# Patient Record
Sex: Female | Born: 1970
Health system: Southern US, Community
[De-identification: ages and names within clinical notes are randomized; demographics above are authoritative.]

## PROBLEM LIST (undated history)

## (undated) DIAGNOSIS — Z8489 Family history of other specified conditions: Secondary | ICD-10-CM

## (undated) DIAGNOSIS — R011 Cardiac murmur, unspecified: Secondary | ICD-10-CM

## (undated) DIAGNOSIS — Z86018 Personal history of other benign neoplasm: Secondary | ICD-10-CM

## (undated) DIAGNOSIS — Z9889 Other specified postprocedural states: Secondary | ICD-10-CM

## (undated) DIAGNOSIS — R112 Nausea with vomiting, unspecified: Secondary | ICD-10-CM

## (undated) DIAGNOSIS — Q249 Congenital malformation of heart, unspecified: Secondary | ICD-10-CM

## (undated) DIAGNOSIS — Z9289 Personal history of other medical treatment: Secondary | ICD-10-CM

## (undated) DIAGNOSIS — E039 Hypothyroidism, unspecified: Secondary | ICD-10-CM

## (undated) DIAGNOSIS — T8859XA Other complications of anesthesia, initial encounter: Secondary | ICD-10-CM

## (undated) DIAGNOSIS — E042 Nontoxic multinodular goiter: Secondary | ICD-10-CM

## (undated) DIAGNOSIS — T4145XA Adverse effect of unspecified anesthetic, initial encounter: Secondary | ICD-10-CM

## (undated) HISTORY — DX: Cardiac murmur, unspecified: R01.1

## (undated) HISTORY — DX: Personal history of other benign neoplasm: Z86.018

## (undated) HISTORY — PX: OTHER SURGICAL HISTORY: SHX169

## (undated) HISTORY — DX: Personal history of other medical treatment: Z92.89

## (undated) HISTORY — PX: AORTIC VALVE REPLACEMENT: SHX41

## (undated) HISTORY — PX: MYOMECTOMY: SHX85

## (undated) HISTORY — DX: Other specified postprocedural states: Z98.890

## (undated) HISTORY — PX: CARDIAC VALVE REPLACEMENT: SHX585

## (undated) HISTORY — PX: CARDIAC CATHETERIZATION: SHX172

---

## 1998-10-10 ENCOUNTER — Other Ambulatory Visit: Admission: RE | Admit: 1998-10-10 | Discharge: 1998-10-10 | Payer: Self-pay | Admitting: *Deleted

## 1998-10-24 ENCOUNTER — Ambulatory Visit (HOSPITAL_COMMUNITY): Admission: RE | Admit: 1998-10-24 | Discharge: 1998-10-24 | Payer: Self-pay | Admitting: Family Medicine

## 2000-05-28 ENCOUNTER — Encounter: Payer: Self-pay | Admitting: Family Medicine

## 2000-05-28 ENCOUNTER — Encounter: Admission: RE | Admit: 2000-05-28 | Discharge: 2000-05-28 | Payer: Self-pay | Admitting: Family Medicine

## 2000-08-26 ENCOUNTER — Encounter: Admission: RE | Admit: 2000-08-26 | Discharge: 2000-08-26 | Payer: Self-pay | Admitting: Family Medicine

## 2000-08-26 ENCOUNTER — Encounter: Payer: Self-pay | Admitting: Family Medicine

## 2000-10-19 ENCOUNTER — Encounter: Payer: Self-pay | Admitting: Family Medicine

## 2000-10-19 ENCOUNTER — Encounter: Admission: RE | Admit: 2000-10-19 | Discharge: 2000-10-19 | Payer: Self-pay | Admitting: Family Medicine

## 2001-01-05 ENCOUNTER — Other Ambulatory Visit: Admission: RE | Admit: 2001-01-05 | Discharge: 2001-01-05 | Payer: Self-pay | Admitting: Obstetrics and Gynecology

## 2001-05-19 HISTORY — PX: THYROID LOBECTOMY: SHX420

## 2002-04-18 ENCOUNTER — Other Ambulatory Visit: Admission: RE | Admit: 2002-04-18 | Discharge: 2002-04-18 | Payer: Self-pay | Admitting: Obstetrics and Gynecology

## 2002-04-28 ENCOUNTER — Encounter (INDEPENDENT_AMBULATORY_CARE_PROVIDER_SITE_OTHER): Payer: Self-pay | Admitting: Specialist

## 2002-04-28 ENCOUNTER — Inpatient Hospital Stay (HOSPITAL_COMMUNITY): Admission: RE | Admit: 2002-04-28 | Discharge: 2002-04-30 | Payer: Self-pay | Admitting: Obstetrics and Gynecology

## 2003-05-05 ENCOUNTER — Other Ambulatory Visit: Admission: RE | Admit: 2003-05-05 | Discharge: 2003-05-05 | Payer: Self-pay | Admitting: Obstetrics and Gynecology

## 2004-06-21 ENCOUNTER — Other Ambulatory Visit: Admission: RE | Admit: 2004-06-21 | Discharge: 2004-06-21 | Payer: Self-pay | Admitting: Obstetrics and Gynecology

## 2006-05-01 ENCOUNTER — Ambulatory Visit: Payer: Self-pay | Admitting: Family Medicine

## 2006-08-31 ENCOUNTER — Ambulatory Visit (HOSPITAL_COMMUNITY): Admission: RE | Admit: 2006-08-31 | Discharge: 2006-08-31 | Payer: Self-pay | Admitting: Cardiology

## 2006-09-07 ENCOUNTER — Ambulatory Visit (HOSPITAL_COMMUNITY): Admission: RE | Admit: 2006-09-07 | Discharge: 2006-09-07 | Payer: Self-pay | Admitting: Cardiology

## 2006-09-24 ENCOUNTER — Ambulatory Visit: Payer: Self-pay | Admitting: Cardiothoracic Surgery

## 2006-10-15 ENCOUNTER — Ambulatory Visit: Payer: Self-pay | Admitting: Cardiothoracic Surgery

## 2006-10-23 ENCOUNTER — Ambulatory Visit (HOSPITAL_COMMUNITY): Admission: RE | Admit: 2006-10-23 | Discharge: 2006-10-23 | Payer: Self-pay | Admitting: Cardiology

## 2006-11-02 ENCOUNTER — Ambulatory Visit: Payer: Self-pay | Admitting: Cardiothoracic Surgery

## 2006-11-03 ENCOUNTER — Inpatient Hospital Stay (HOSPITAL_COMMUNITY): Admission: RE | Admit: 2006-11-03 | Discharge: 2006-11-08 | Payer: Self-pay | Admitting: Cardiothoracic Surgery

## 2006-11-03 ENCOUNTER — Ambulatory Visit: Payer: Self-pay | Admitting: Cardiothoracic Surgery

## 2006-11-03 ENCOUNTER — Encounter: Payer: Self-pay | Admitting: Cardiothoracic Surgery

## 2006-12-03 ENCOUNTER — Ambulatory Visit: Payer: Self-pay | Admitting: Cardiothoracic Surgery

## 2006-12-03 ENCOUNTER — Encounter (HOSPITAL_COMMUNITY): Admission: RE | Admit: 2006-12-03 | Discharge: 2007-03-03 | Payer: Self-pay | Admitting: Cardiology

## 2006-12-03 ENCOUNTER — Encounter: Admission: RE | Admit: 2006-12-03 | Discharge: 2006-12-03 | Payer: Self-pay | Admitting: Cardiothoracic Surgery

## 2007-03-04 ENCOUNTER — Encounter (HOSPITAL_COMMUNITY): Admission: RE | Admit: 2007-03-04 | Discharge: 2007-05-19 | Payer: Self-pay | Admitting: Cardiology

## 2007-08-01 ENCOUNTER — Emergency Department (HOSPITAL_COMMUNITY): Admission: EM | Admit: 2007-08-01 | Discharge: 2007-08-01 | Payer: Self-pay | Admitting: Emergency Medicine

## 2009-02-12 ENCOUNTER — Encounter: Admission: RE | Admit: 2009-02-12 | Discharge: 2009-02-12 | Payer: Self-pay | Admitting: Endocrinology

## 2009-02-27 ENCOUNTER — Other Ambulatory Visit: Admission: RE | Admit: 2009-02-27 | Discharge: 2009-02-27 | Payer: Self-pay | Admitting: Interventional Radiology

## 2009-02-27 ENCOUNTER — Encounter: Admission: RE | Admit: 2009-02-27 | Discharge: 2009-02-27 | Payer: Self-pay | Admitting: Endocrinology

## 2009-02-27 ENCOUNTER — Encounter (INDEPENDENT_AMBULATORY_CARE_PROVIDER_SITE_OTHER): Payer: Self-pay | Admitting: Interventional Radiology

## 2009-11-09 ENCOUNTER — Inpatient Hospital Stay (HOSPITAL_COMMUNITY): Admission: RE | Admit: 2009-11-09 | Discharge: 2009-11-11 | Payer: Self-pay | Admitting: Obstetrics and Gynecology

## 2009-11-16 ENCOUNTER — Encounter
Admission: RE | Admit: 2009-11-16 | Discharge: 2009-12-16 | Payer: Self-pay | Source: Home / Self Care | Admitting: Obstetrics and Gynecology

## 2009-12-17 ENCOUNTER — Encounter: Admission: RE | Admit: 2009-12-17 | Discharge: 2010-01-16 | Payer: Self-pay | Admitting: Obstetrics and Gynecology

## 2010-01-17 ENCOUNTER — Encounter: Admission: RE | Admit: 2010-01-17 | Discharge: 2010-02-05 | Payer: Self-pay | Admitting: Obstetrics and Gynecology

## 2010-02-17 ENCOUNTER — Encounter: Admission: RE | Admit: 2010-02-17 | Discharge: 2010-03-19 | Payer: Self-pay | Admitting: Obstetrics and Gynecology

## 2010-03-20 ENCOUNTER — Encounter
Admission: RE | Admit: 2010-03-20 | Discharge: 2010-04-19 | Payer: Self-pay | Source: Home / Self Care | Admitting: Obstetrics and Gynecology

## 2010-04-20 ENCOUNTER — Encounter
Admission: RE | Admit: 2010-04-20 | Discharge: 2010-05-20 | Payer: Self-pay | Source: Home / Self Care | Attending: Obstetrics and Gynecology | Admitting: Obstetrics and Gynecology

## 2010-05-21 ENCOUNTER — Encounter
Admission: RE | Admit: 2010-05-21 | Discharge: 2010-06-18 | Payer: Self-pay | Source: Home / Self Care | Attending: Obstetrics and Gynecology | Admitting: Obstetrics and Gynecology

## 2010-08-04 LAB — SURGICAL PCR SCREEN: MRSA, PCR: NEGATIVE

## 2010-08-04 LAB — CBC
HCT: 30.8 % — ABNORMAL LOW (ref 36.0–46.0)
HCT: 36.6 % (ref 36.0–46.0)
Hemoglobin: 10.9 g/dL — ABNORMAL LOW (ref 12.0–15.0)
MCH: 34.1 pg — ABNORMAL HIGH (ref 26.0–34.0)
MCV: 97 fL (ref 78.0–100.0)
MCV: 98.3 fL (ref 78.0–100.0)
Platelets: 128 10*3/uL — ABNORMAL LOW (ref 150–400)
RBC: 3.78 MIL/uL — ABNORMAL LOW (ref 3.87–5.11)
RDW: 12.9 % (ref 11.5–15.5)
WBC: 13 10*3/uL — ABNORMAL HIGH (ref 4.0–10.5)

## 2010-10-01 NOTE — Discharge Summary (Signed)
[name], [name] Short NO.:  [id number]   MEDICAL RECORD NO.:  [id number]          PATIENT TYPE:  INP   LOCATION:  2012                         FACILITY:  [facility]   PHYSICIAN:  Rowe Clack, P.A.-C. DATE OF BIRTH:  [birth year]   DATE OF ADMISSION:  11/03/2006  DATE OF DISCHARGE:  11/07/2006                               DISCHARGE SUMMARY   HISTORY OF PRESENT ILLNESS:  The patient is a 40 year old female who has  had a history of congenital heart disease.  She had been followed at  [facility] by Dr. Darliss Cheney, pediatric cardiologist, since  age 45, and was noted to have aortic regurgitation.  She was followed  between ages 24 and 74, and underwent cardiac catheterization at age 52.  In her 19s, she moved away, and when she returned she began to see Dr.  Elsie Lincoln, and has been having echocardiograms done annually to evaluate  her degree of aortic insufficiency.  Because of the evidence of dilated  aorta on her echocardiogram done in the fall of 2007, a CT scan was done  and this revealed ascending aortic dilatation to 5.2 cm.  She also had  moderate to severe aortic insufficiency with mild aortic stenosis on the  previous echocardiogram.  Estimated valve area was 1.9 cm2.  The left  ventricular end-diastolic dimension was 4.9 cm.  Her left ventricular  function was suggested to be normal without wall motion abnormality;  however, there was evidence of mild mitral regurgitation.  She was  referred to Dr. Tyrone Sage for surgical correction, and was admitted this  hospitalization for the procedure.   She denied any symptoms of congestive heart failure, and exercises on a  regular basis without limitation.  She denies hypertension, diabetes or  hyperlipidemia.  She is a nonsmoker.  She does have a family history of  cardiac disease, and paternal grandfather died in his 81s of cardiac  disease.  Both her mother and her maternal grandmother died at age 62,  one of  cancer of the gallbladder, and the other of liver cancer.  Her  father is alive with prostate cancer.  She has siblings with thyroid  problems, but no history of cardiac disease.  There is no history of  sudden death in her siblings.  She was admitted this hospitalization for  the surgical repair of her ascending aorta and aortic valve.   PAST MEDICAL HISTORY:  Positive for:  1. Fibroid tumor in the uterus, the size of a football, and removed      in 2003.  2. She also has a new diagnosis of a thyroid mass noted on CT scan.   MEDICATIONS PRIOR TO ADMISSION:  Birth control pills.   ALLERGIES:  No known drug allergies.   Family history, social history, review of systems and physical exam:  Please see the history and physical done at the time of admission.   HOSPITAL COURSE:  The patient was admitted electively on November 03, 2006,  taken to the operating room, at which time she underwent the following  procedure:  1. Aortic valve  replacement with a #25 Edwards pericardial      bioprosthetic.  2. Resection and grafting of ascending aortic dilatation with      replacement using a 30-mm Dacron Hemashield graft.   These procedures were performed by Dr. Tyrone Sage.  She tolerated the  procedure well, and was taken to the surgical intensive care unit in  stable condition.   POSTOPERATIVE HOSPITAL COURSE:  The patient has done well.  She was  weaned from the ventilator without difficulty.  She has remained  hemodynamically stable.  She has remained in normal sinus rhythm.  All  routine lines, monitors and drainage devices have been discontinued in  the standard fashion.  She has tolerated a routine advancement in  activity commensurate to level of postoperative convalescence using  standard cardiac rehabilitation phase 1 modalities.  Her incision is  healing well without evidence of infection.  Her valve sounds are crisp  without evidence of murmur.  She does have a moderate postoperative   anemia with a hematocrit of 28 on November 06, 2006.  She is being  supplemented with both iron and folic acid.  She had a postoperative  thrombocytopenia, but this is improving.  Most recent platelet count on  November 06, 2006, is 93,000.  Capillary blood glucoses have been under  adequate control.  She has tolerated a gentle diuresis, and is  responding well with no evidence of significant edema.  Her overall  status is felt to be stable for tentative discharge in the morning of  November 07, 2006, pending morning round reevaluation.   CONDITION ON DISCHARGE:  Stable and improved.   DISCHARGE MEDICATIONS:  Will be as follows:  1. Aspirin 325 mg daily.  2. Lopressor 25 mg twice daily.  3. Ferrous gluconate 324 mg daily.  4. Folic acid 1 mg daily.  5. She is to resume her birth control pill.  6. For pain, oxycodone 5 mg 1-2 tablets every 4-6 hours as needed.   DISCHARGE INSTRUCTIONS:  The patient received written instructions  regarding medications, activity, diet, wound care and followup.   FOLLOWUP:  Includes Dr. Elsie Lincoln in 2 weeks.  She is instructed to call  for this appointment.  Followup with Dr. Tyrone Sage will be on December 03, 2006, at 1:30 p.m. with a chest x-ray from [facility].   FINAL DIAGNOSES:  Severe aortic insufficiency and dilated ascending  aorta, now status post surgical repair.   OTHER DIAGNOSES:  Include:  1. Postoperative anemia.  2. Postoperative thrombocytopenia.  3. Remote history of fibroid tumors status post resection.  4. New diagnosis of thyroid mass on CT scan.      Rowe Clack, P.A.-C.     Sherryll Burger  D:  11/06/2006  T:  11/07/2006  Job:  [id number]   cc:   [name], M.D.  Sharlot Gowda, M.D.

## 2010-10-01 NOTE — Assessment & Plan Note (Signed)
OFFICE VISIT   [name] Short, [name] Short  DOB:  [birth year]                                        Oct 15, 2006  CHART #:  [id number]   The patient returns today for further discussion about the increasing  degree of her aortic insufficiency and dilated ascending aorta in the  range of 5.2 to 5.3 cm.  Since she was last seen, she has discussed the  case with Dr. Elsie Lincoln, and cardiac catheterization is arranged prior to  surgery.  In addition, she is also seeing Dr. Lucianne Muss in regard to the  multinodular goiter.  Thyroid function studies were obtained though I do  not have the results of these currently.   The patient's symptoms have not changed.  She denies any significant  shortness of breath or overt symptoms of congestive heart failure, and  exercises on a regular basis.   PHYSICAL EXAMINATION:  Her blood pressure is 128/91, pulse is 84,  respiratory rate is 18, O2 sat is 100%.  She has no pedal edema.  On  cardiac exam, murmur of aortic insufficiency, 2-3/6 is unchanged and  best heard along the right sternal border.   Dr. Elsie Lincoln plans outpatient cardiac catheterization on June the 6th, and  the patient would like to proceed with surgery on June the 17th.  We  discussed further the options of replacement, including root replacement  with tissue versus a mechanical valve prosthesis.  The patient, after  considering and investigation on her own, prefers to have a tissue  prosthesis rather than a mechanical valve.  The risks and options of  surgery have been explained to her and her husband again, including the  risk of death, infection, stroke, myocardial infarction, bleeding, blood  transfusion, and she is willing to proceed with acendind aortic and  aortic root replacement with  tissue valve and the ascending aortic replacement on June the 17th after  reviewing the cardiac catheterization films that are planned to be done  on June the 6th.   Sheliah Plane, MD  Electronically Signed   EG/MEDQ  D:  10/15/2006  T:  10/15/2006  Job:  [id number]   cc:   [name] Short, M.D.  Reather Littler, M.D.

## 2010-10-01 NOTE — Op Note (Signed)
[name], SHUFORD NO.:  [id number]   MEDICAL RECORD NO.:  [id number]          PATIENT TYPE:  INP   LOCATION:  2312                         FACILITY:  [facility]   PHYSICIAN:  Guadalupe Maple, M.D.  DATE OF BIRTH:  1970/12/08   DATE OF PROCEDURE:  11/02/2006  DATE OF DISCHARGE:                               OPERATIVE REPORT   PROCEDURE:  Intraoperative transesophageal echocardiography.   HISTORY:  [name] Short is a 40 year old woman female with a history  of an ascending aortic aneurysm and severe aortic insufficiency is  scheduled to undergo aortic valve replacement and repair of abdominal  aortic aneurysm by Dr. Sheliah Plane.  Intraoperative transesophageal  echocardiography was requested to evaluate the aortic valve and the  ascending aorta and to determine if any other valvular pathology were  present and to surf AV monitor for intraoperative volume status and left  ventricular function.   DESCRIPTION OF PROCEDURE:  The patient was brought into the operating  room at [facility].  General anesthesia is without difficulty.  The trachea was intubated easily.  The transesophageal echocardiography  probe was then inserted into the esophagus without difficulty.   IMPRESSION:  PREBYPASS FINDINGS:  1. Aortic valve.  The aortic valve was bicuspid and opened well.      There was no evidence of aortic stenosis.  Aortic valve area      measured 2.48 cm2 by the continuity, equation peak aortic gradient      was 26 mmHg mean, aortic gradient 12.8 mmHg.  There was severe      aortic insufficiency.  There was an area of malcoaptation of the      leaflets in the anterior portion of the coaptation line.  There was      a hint of aortic insufficiency which contacted the anterior leaflet      of the mitral valve.  The aortic insufficiency jet slope measured      3.7 meters per second with a pressure half time of 91.6      milliseconds.  The aortic annulus  measured 2.7 cm, the aortic root      of the sinuses of  Valsalva measured 3.8 cm, and the aortic root at      the sinotubular ridge measured 3.47 cm.  2. Ascending aorta.  There was dilation of the ascending aorta which      began approximately 1 to 2 cm distal to the sinotubular ridge.  The      maximum aortic diameter measured 5.1 cm at approximately 5 cm      distal to the aortic valve.  There was no evidence of dissection of      the ascending aorta.  3. Mitral valve.  The mitral leaflets appeared normal.  They coapted      well.  There was trace mitral insufficiency in multiple views.      There was no evidence of prolapse or fluttering in the leaflets.  4. Left ventricle.  The left ventricle was dilated and appeared      hyperdynamic.  Left ventricular wall thickness measured 0.9 cm, the      left ventricular diameter at end-diastole at the mid papillary      level measuring 5.40 cm.  Left ventricular end systolic diameter      measured 3.28 cm, left ventricular ejection fraction was estimated      at 60% to 65%.  There was good contractility in all left      ventricular segments interrogated and there was no      evidence of thrombus in the left ventricular apex.  5. Right ventricle.  The right ventricle   DICTATION ENDS HERE           ______________________________  Guadalupe Maple, M.D.     DCJ/MEDQ  D:  11/03/2006  T:  11/04/2006  Job:  [id number]   cc:   Guadalupe Maple, M.D.

## 2010-10-01 NOTE — Cardiovascular Report (Signed)
[name] Short, [name] Short NO.:  [id number]   MEDICAL RECORD NO.:  [id number]          PATIENT TYPE:  OIB   LOCATION:  2852                         FACILITY:  [facility]   PHYSICIAN:  [name] Short, M.D.DATE OF BIRTH:  03/29/1971   DATE OF PROCEDURE:  10/23/2006  DATE OF DISCHARGE:                            CARDIAC CATHETERIZATION   PROCEDURES PERFORMED:  1. Selective coronary angiography by Judkins technique.  2. Retrograde left heart catheterization.  3. Left ventricular angiography.  4. Aortic root angiogram.  5. Right heart catheterization with thermodilution and Fick cardiac      output determinations.   COMPLICATIONS:  None.   ENTRY SITE:  Right femoral artery and vein.   CATHETERS USED:  6-French arterial sheath and catheters which included a  6 FL6 left coronary catheter and a 6-French FL4 right coronary catheter.   INTERVENTIONS:  None.   PATIENT PROFILE:  [name] Short is a 40 year old white married female who is  a primary care patient of Dr. Ronnald Short, a cardiology patient of  mine, and formerly of Dr. Darliss Short, [facility]  who followed [name] as a child at around age 82.   [name] Short recently was seen by Dr. Susann Short and associates and referred to  [place] because of a heart murmur.  A 2-D echocardiogram showed moderately  severe aortic regurgitation and aortic root dilatation and, after I  examined her and took a history and got what limited records were  available from the [facility] pediatric program from years ago, I then saw the  patient in the office.  She is asymptomatic, having no shortness of  breath, chest pain or syncope.  We then decided to press ahead with  cardiac catheterization because of the patient's history of having had a  bicuspid valve and now having a markedly ectatic aorta with AI.   She has had a CT scan showing a 3 x 6 cm mass in the thoracic inlet  showing later that it was a multinodular goiter.  She has seen  [name] Short who feels that she is euthyroid and that this is a nontoxic  goiter.  She is not diabetic.  Today, [name] Short presents for an outpatient  cardiac catheterization to stratify her for upcoming aortic valve  surgery in view of her chronic AI, which is now severe, and marked  ectasia of the aorta.   RESULTS SHOW THE FOLLOWING:  Right atrial pressure is mean of 3, right  ventricular pressure 25/0, end-diastolic pressure 5.  Pulmonary artery  pressure 15/4, end-diastolic pressure 9.  This 10 mm gradient across the  pulmonary artery to right ventricle is within established normal limits.  Pulmonary capillary wedge mean pressure 10.  Fick cardiac output 5.9.  Fick cardiac index 3.1.  Thermodilution cardiac output 7.4.  Thermodilution cardiac index 3.9.  Left ventricular pressure 123/0, end-  diastolic pressure 13, central aortic pressure 117/75, mean of 92.  There was no significant aortic valve gradient by pullback technique.   ANGIOGRAPHIC RESULTS:  The coronary arteries are normal.  It is a system  where  both circumflex and LAD are codominant.  The right coronary artery  is small.  The arteries all look pristine with no lesions anywhere.   Aortic root angiography shows that there is a very large ectatic aorta  including the innominate and subclavian arteries.  The left ventricle  opacifies with dye with the first systolic beat indicating severe AI.  The LV is upper limits of normal to mildly slightly dilated in size.  Ejection fraction is estimated to be 50-60% and I lean more toward the  60%.  There is no mitral regurgitation seen.   FINAL DIAGNOSES:  1. Congenital heart disease with a bicuspid aortic valve, followed      since age 62.  2. Asymptomatic aortic regurgitation, severe by angiography.  3. Angiographically patent coronary arteries with no evidence of any      significant stenoses.  4. Low-normal to normal LV systolic function.  5. No mitral regurgitation.  6.  Normal cardiac outputs.   PLAN:  The patient's results will be discussed with Dr. Tyrone Short and he  will plan a course of action regarding aortic valve replacement and  possibly consideration for an aortic root replacement.  I will defer  that decision to him.  The patient will be discharged today after her 6-  hour recovery and be followed as an outpatient.           ______________________________  [name] Short, M.D.     WHG/MEDQ  D:  10/23/2006  T:  10/23/2006  Job:  [id number]   cc:   [name] Plane, MD  [name] Short, M.D.  Reather Short, M.D.

## 2010-10-01 NOTE — Assessment & Plan Note (Signed)
OFFICE VISIT   [name] Short, [name] Short  DOB:  05/28/1970                                        December 03, 2006  CHART #:  [id number]   HISTORY OF PRESENT ILLNESS:  The patient is a 40 year old female who is  status post replacement of the ascending aorta with a 30-mm Hemashield  Dacron graft supra coronary, and replacement of the aortic valve with  pericardial tissue valve, [employer], model 2700 25 mm, serial  [id number].  The procedure was performed 11/03/2006 by Dr. Tyrone Sage.  She  is here for her post discharge followup appointment.  She voices no  current complaints.  She did have some initial difficulty with insomnia  when she first got home, but this has resolved.  She has also had some  mild musculoskeletal issues which have improved with a type of massage  therapy.  She is not on Coumadin at this time for her tissue valve.   PHYSICAL EXAMINATION:  Vital signs:  Blood pressure 118/81, pulse 86,  respirations 18, oxygen saturation is 100%.  Pulmonary examination:  Lung sounds are clear.  Cardiac examination:  Regular rate and rhythm,  normal S1, S2 without murmurs, gallops, or rubs.  Extremity examination  reveals no edema.  Incisions are all healing well without evidence of  infection.  Chest x-ray was reviewed and shows clear lung fields and no  evidence of acute disease.   ASSESSMENT:  The patient is doing quite well following her aortic arch  and aortic valve replacement as described above for her preoperative  diagnosis of severe aortic insufficiency with dilated ascending aorta.  She was seen by Dr. Tyrone Sage who informed her that she can go back to  driving at this time.  Additionally, he stated that she can begin  working in August.  She will be followed up on a routine basis by her  cardiologist, Dr. Elsie Lincoln.  Follow up with the [facility] office will be on a  p.r.n. basis at this time.   Rowe Clack, P.A.-C.   Sherryll Burger  D:  12/03/2006  T:   12/04/2006  Job:  [id number]

## 2010-10-01 NOTE — Op Note (Signed)
NAMEKATELINE, KINKADE NO.:  [id number]   MEDICAL RECORD NO.:  [id number]          PATIENT TYPE:  OIB   LOCATION:  2852                         FACILITY:  [facility]   PHYSICIAN:  Sheliah Plane, MD    DATE OF BIRTH:  [birth year]   DATE OF PROCEDURE:  11/03/2006  DATE OF DISCHARGE:  10/23/2006                               OPERATIVE REPORT   PREOPERATIVE DIAGNOSIS:  Severe aortic insufficiency and dilated  ascending aorta.   POSTOPERATIVE DIAGNOSIS:  Severe aortic insufficiency and dilated  ascending aorta.   SURGICAL PROCEDURES:  Replacement of the ascending aorta with a 30 mm  Hemashield Dacron graft supra coronary and replacement of the aortic  valve with a pericardial tissue valve, [employer], model 2700  25 mm, serial number [id number].   SURGEON:  Sheliah Plane, MD   FIRST ASSISTANT:  Gershon Crane, PA   BRIEF HISTORY:  The patient is a 40 year old female who had longstanding  history of aortic valvular disease and dilated as a small child and a  more recently had been followed and noted to have a expanding ascending  aorta with severe aortic insufficiency.  Surgical repair was recommended  to the patient,  Discussion of valve types including [employer] freestyle  homografts, tissue valves.  Mechanical valves were all discussed in  detail with the patient.  She desired not to take Coumadin.  Risks and  options were discussed with the patient in detail and she was willing to  proceed.   DESCRIPTION OF PROCEDURE:  With Swan-Ganz and arterial line monitoring  placed.  The patient underwent general endotracheal anesthesia without  incident.  Skin of the chest and legs was prepped with Betadine and  draped in the usual sterile manner.  A transesophageal echo probe was  placed and a findings are dictated under separate note but they  confirmed a dilated ascending aorta, fairly normal aortic root to just  under 3 cm at the sinotubular ridge with bicuspid  aortic valve and mild  ventricular dilatation.  The patient skin chest and legs was prepped  with Betadine and draped in usual sterile manner.  Median sternotomy was  performed.  Pericardium was opened, aorta was examined.  The patient was  systemically heparinized.  The aortic cannulation was performed in  distal ascending aorta where the aorta is not dilated.  A right atrial  cannulation site was created.  A retrograde cardioplegia catheter was  placed in the coronary sinus. The patient was placed on cardiopulmonary  bypass 2.4 liters per minute per meter squared.  The right superior  pulmonary vein vent was placed.  The patient's body temperature was  cooled to 30 degrees.  Aortic crossclamp was applied.  Cold blood  potassium cardioplegia was initially administered retrograde.  With the  heart arrested, the ascending aorta was opened and the dilated portions  excised.  With the aortic root not particularly dilated and the  sinotubular ridge of fairly normal caliber. Supracoronary placement of  graft was selected and the aortic valve was excised and sized for 25  pericardial tissue valve.  Using #2 Tycron pledgeted sutures were placed  circumferentially around the aortic annulus and the valve was secured in  place. 30 mm Hemashield woven graft was selected to replace the  ascending aorta.  The proximal suture line was done over felt strips  with a running 3-0 Prolene suture.  The distal anastomosis was also done  in a circumferential manner over felt strip with a running 3-0 Prolene.  Prior to complete closure, the heart was allowed to passively fill  deair.  Warm retrograde cardioplegia was administered.  Aortic  crossclamp was removed with total crossclamp time of 89 minutes. A  Magoon needle was placed in the ascending aorta further de-airing the  heart.  The patient spontaneously converted to a sinus rhythm with body  temperature rewarmed to 37 degrees.  She was then ventilated  weaned from  cardiopulmonary bypass without difficulty.  TEE showed good function of  mitral and aortic valve.  The patient was decannulated in usual fashion.  Protamine sulfate was administered.  With operative field hemostatic,  two atrial and two ventricular pacing wires applied.  The pericardium  was reapproximated.  Sternum closed #6 stainless steel wire over two  Silastic mediastinal drains.  Sternum was closed #6 stainless steel  wire.  Fascia closed with interrupted 0-0 Vicryl running 3-0 Vicryl  subcutaneous tissue, 4-0 subcuticular stitch in skin edges.  Dry  dressings were applied.  Sponge and needle count was reported correct at  completion of procedure.  The patient tolerated the procedure without  obvious complication was transferred to surgical intensive care unit for  further postoperative care.      Sheliah Plane, MD  Electronically Signed     EG/MEDQ  D:  11/19/2006  T:  11/20/2006  Job:  [id number]   cc:   [name], M.D.

## 2010-10-04 NOTE — Discharge Summary (Signed)
   NAME:  [name] Short, [name] Short                        ACCOUNT NO.:  [id number]   MEDICAL RECORD NO.:  [id number]                   PATIENT TYPE:  INP   LOCATION:  0462                                 FACILITY:  [facility]   PHYSICIAN:  Michelle L. Vincente Poli, M.D.            DATE OF BIRTH:  Oct 28, 1970   DATE OF ADMISSION:  04/28/2002  DATE OF DISCHARGE:  04/30/2002                                 DISCHARGE SUMMARY   ADMISSION DIAGNOSES:  Symptomatic fibroids.   DISCHARGE DIAGNOSES:  Symptomatic fibroids.   HOSPITAL COURSE:  The patient is a 40 year old gravida 0 with symptomatic  uterine fibroids.  Of note, she has a history of mild aortic stenosis.  On  the day of admission she underwent abdominal myomectomies.  The surgery went  very well.  Postoperative course was very uncomplicated.  Her postoperative  hemoglobin on postoperative day number one was 10.1.  Her white blood cell  count was 5.1.  She remained afebrile with stable vital signs.  She was  discharged home on postoperative day number two after tolerating regular  diet, ambulating, voiding, and having flatus.  She was discharged home with  Tylox number 30 and ibuprofen 600 mg number 30 to take as needed for pain.  She will follow up in four to six weeks for a postoperative check.  She was  discharged home and advised no driving for one week, no heavy lifting, and  to call if she has nausea, vomiting, severe abdominal pain, temperature  greater than 100.5, or redness or drainage from incision site.                                               Michelle L. Vincente Poli, M.D.    Florestine Avers  D:  06/14/2002  T:  06/14/2002  Job:  [id number]

## 2010-10-04 NOTE — Op Note (Signed)
NAME:  [name] Short, [name] Short                        ACCOUNT NO.:  [id number]   MEDICAL RECORD NO.:  [id number]                   PATIENT TYPE:  INP   LOCATION:  0462                                 FACILITY:  [facility]   PHYSICIAN:  Michelle L. Vincente Poli, M.D.            DATE OF BIRTH:  [birth year]   DATE OF PROCEDURE:  DATE OF DISCHARGE:  04/30/2002                                 OPERATIVE REPORT   PREOPERATIVE DIAGNOSIS:  Symptomatic fibroids.   POSTOPERATIVE DIAGNOSIS:  Symptomatic fibroids.   PROCEDURE:  Abdominal myomectomies.   SURGEON:  Michelle L. Vincente Poli, M.D.   ASSISTANT:  Juluis Mire, M.D.   ANESTHESIA:  General.   ESTIMATED BLOOD LOSS:  1300 mL.   FINDINGS:  Myomatous uterus.   DESCRIPTION OF PROCEDURE:  The patient was taken to the operating room.  She  was intubated without difficulty.  She was then prepped and draped in the  usual sterile fashion.  A Foley catheter was inserted into the bladder.  Using a scalpel, a low transverse incision was made and a Pfannenstiel  incision was developed with good hemostasis.  The fascia was scored in the  midline and extended laterally, and the peritoneum was entered bluntly.  The  peritoneal incision was then stretched.  Exploration of the abdomen and  pelvis revealed a markedly enlarged uterus; however, there appeared to be no  adhesions and there was no evidence of endometriosis and her adnexa appeared  to be within normal limits.  We then examined the uterus thoroughly.  She  had a very large, approximately diameter of 14-15 cm, fibroid which was  arising from the fundus.  The uterus was exteriorized and then we basically  used a scalpel and removed the fibroid at the base.  There was vigorous  blood supply to the area and that was noted; however, there was a smaller  fibroid which was just beneath this large one.  We then cored it out in the  fundal region and then for hemostasis we used several running stitches in  layers  using 2-0 Vicryl suture, and the serosa was closed with 2-0 Vicryl.  Because of the nature of the incision, the patient would need to have a  cesarean delivery if she conceived in the future, and the patient and her  mother were counseled after surgery in regard to this.  After hemostasis was  assured, Interceed was placed at the incision site and irrigation was  performed.  No bleeding was noted, and the abdomen and peritoneum were  closed.  The peritoneum was closed using 0 Vicryl in a continuous running  stitch, and the rectus muscles were reapproximated using the same 0 Vicryl.  The fascia was closed using 0 Vicryl in a continuous running stitch,  starting at each corner and meeting in the midline. After irrigation of the  subcutaneous layer, the skin was closed with staples.  All sponge, lap, and  instrument counts were correct x2.  The patient tolerated the procedure well  and went to the recovery room in stable condition.                                               Michelle L. Vincente Poli, M.D.   Florestine Avers  D:  06/14/2002  T:  06/14/2002  Job:  [id number]

## 2011-03-05 LAB — POCT I-STAT 4, (NA,K, GLUC, HGB,HCT)
Glucose, Bld: 102 — ABNORMAL HIGH
Glucose, Bld: 108 — ABNORMAL HIGH
Glucose, Bld: 123 — ABNORMAL HIGH
Glucose, Bld: 139 — ABNORMAL HIGH
Glucose, Bld: 71
Glucose, Bld: 99
HCT: 24 — ABNORMAL LOW
HCT: 26 — ABNORMAL LOW
HCT: 27 — ABNORMAL LOW
HCT: 32 — ABNORMAL LOW
HCT: 32 — ABNORMAL LOW
HCT: 33 — ABNORMAL LOW
Hemoglobin: 10.9 — ABNORMAL LOW
Hemoglobin: 10.9 — ABNORMAL LOW
Hemoglobin: 11.2 — ABNORMAL LOW
Hemoglobin: 8.2 — ABNORMAL LOW
Hemoglobin: 8.8 — ABNORMAL LOW
Hemoglobin: 9.2 — ABNORMAL LOW
Operator id: 252761
Operator id: 3342
Operator id: 3342
Operator id: 3342
Operator id: 3342
Operator id: 3342
Potassium: 3.1 — ABNORMAL LOW
Potassium: 3.3 — ABNORMAL LOW
Potassium: 3.4 — ABNORMAL LOW
Potassium: 3.5
Potassium: 3.5
Potassium: 4.3
Sodium: 132 — ABNORMAL LOW
Sodium: 132 — ABNORMAL LOW
Sodium: 134 — ABNORMAL LOW
Sodium: 135
Sodium: 135
Sodium: 138

## 2011-03-05 LAB — CBC
HCT: 25.8 — ABNORMAL LOW
HCT: 25.9 — ABNORMAL LOW
HCT: 27 — ABNORMAL LOW
HCT: 27.6 — ABNORMAL LOW
HCT: 27.6 — ABNORMAL LOW
HCT: 28 — ABNORMAL LOW
Hemoglobin: 8.8 — ABNORMAL LOW
Hemoglobin: 8.9 — ABNORMAL LOW
Hemoglobin: 9.2 — ABNORMAL LOW
Hemoglobin: 9.3 — ABNORMAL LOW
Hemoglobin: 9.5 — ABNORMAL LOW
Hemoglobin: 9.6 — ABNORMAL LOW
MCHC: 33.8
MCHC: 34
MCHC: 34.1
MCHC: 34.2
MCHC: 34.4
MCHC: 34.4
MCV: 92.4
MCV: 92.6
MCV: 93
MCV: 93.2
MCV: 93.3
MCV: 93.8
Platelets: 77 — ABNORMAL LOW
Platelets: 78 — ABNORMAL LOW
Platelets: 85 — ABNORMAL LOW
Platelets: 87 — ABNORMAL LOW
Platelets: 89 — ABNORMAL LOW
Platelets: 93 — ABNORMAL LOW
RBC: 2.78 — ABNORMAL LOW
RBC: 2.79 — ABNORMAL LOW
RBC: 2.9 — ABNORMAL LOW
RBC: 2.96 — ABNORMAL LOW
RBC: 2.98 — ABNORMAL LOW
RBC: 2.98 — ABNORMAL LOW
RDW: 12.1
RDW: 12.3
RDW: 12.5
RDW: 12.6
RDW: 12.7
RDW: 12.7
WBC: 10.4
WBC: 10.7 — ABNORMAL HIGH
WBC: 11.5 — ABNORMAL HIGH
WBC: 11.9 — ABNORMAL HIGH
WBC: 8.6
WBC: 8.7

## 2011-03-05 LAB — POCT I-STAT 3, ART BLOOD GAS (G3+)
Acid-base deficit: 2
Acid-base deficit: 3 — ABNORMAL HIGH
Acid-base deficit: 4 — ABNORMAL HIGH
Acid-base deficit: 5 — ABNORMAL HIGH
Bicarbonate: 19.4 — ABNORMAL LOW
Bicarbonate: 20.6
Bicarbonate: 21.2
Bicarbonate: 22.1
O2 Saturation: 100
O2 Saturation: 100
O2 Saturation: 100
O2 Saturation: 99
Operator id: 183261
Operator id: 252761
Operator id: 3342
Operator id: 3342
Patient temperature: 34.3
Patient temperature: 37.8
TCO2: 20
TCO2: 22
TCO2: 22
TCO2: 23
pCO2 arterial: 30.7 — ABNORMAL LOW
pCO2 arterial: 31.4 — ABNORMAL LOW
pCO2 arterial: 32.3 — ABNORMAL LOW
pCO2 arterial: 34.8 — ABNORMAL LOW
pH, Arterial: 7.402 — ABNORMAL HIGH
pH, Arterial: 7.411 — ABNORMAL HIGH
pH, Arterial: 7.422 — ABNORMAL HIGH
pH, Arterial: 7.424 — ABNORMAL HIGH
pO2, Arterial: 129 — ABNORMAL HIGH
pO2, Arterial: 186 — ABNORMAL HIGH
pO2, Arterial: 363 — ABNORMAL HIGH
pO2, Arterial: 495 — ABNORMAL HIGH

## 2011-03-05 LAB — I-STAT 8, (EC8 V) (CONVERTED LAB)
Acid-base deficit: 1
BUN: 6
Bicarbonate: 23.7
Chloride: 102
Glucose, Bld: 122 — ABNORMAL HIGH
HCT: 28 — ABNORMAL LOW
Hemoglobin: 9.5 — ABNORMAL LOW
Operator id: 203371
Potassium: 3.7
Sodium: 137
TCO2: 25
pCO2, Ven: 38.5 — ABNORMAL LOW
pH, Ven: 7.397 — ABNORMAL HIGH

## 2011-03-05 LAB — I-STAT EC8
Acid-base deficit: 5 — ABNORMAL HIGH
BUN: 6
Bicarbonate: 18.6 — ABNORMAL LOW
Chloride: 108
Glucose, Bld: 141 — ABNORMAL HIGH
HCT: 26 — ABNORMAL LOW
Hemoglobin: 8.8 — ABNORMAL LOW
Operator id: 183261
Potassium: 3.5
Sodium: 139
TCO2: 20
pCO2 arterial: 29.4 — ABNORMAL LOW
pH, Arterial: 7.41 — ABNORMAL HIGH

## 2011-03-05 LAB — BASIC METABOLIC PANEL
BUN: 4 — ABNORMAL LOW
BUN: 4 — ABNORMAL LOW
BUN: 5 — ABNORMAL LOW
BUN: 6
CO2: 21
CO2: 26
CO2: 29
CO2: 29
Calcium: 7.4 — ABNORMAL LOW
Calcium: 7.4 — ABNORMAL LOW
Calcium: 8 — ABNORMAL LOW
Calcium: 8.3 — ABNORMAL LOW
Chloride: 104
Chloride: 105
Chloride: 106
Chloride: 108
Creatinine, Ser: 0.53
Creatinine, Ser: 0.54
Creatinine, Ser: 0.64
Creatinine, Ser: 0.69
GFR calc Af Amer: >60
GFR calc Af Amer: >60
GFR calc Af Amer: >60
GFR calc Af Amer: >60
GFR calc non Af Amer: >60
GFR calc non Af Amer: >60
GFR calc non Af Amer: >60
GFR calc non Af Amer: >60
Glucose, Bld: 117 — ABNORMAL HIGH
Glucose, Bld: 117 — ABNORMAL HIGH
Glucose, Bld: 123 — ABNORMAL HIGH
Glucose, Bld: 124 — ABNORMAL HIGH
Potassium: 3.7
Potassium: 3.9
Potassium: 3.9
Potassium: 3.9
Sodium: 134 — ABNORMAL LOW
Sodium: 135
Sodium: 137
Sodium: 138

## 2011-03-05 LAB — POCT I-STAT 3, VENOUS BLOOD GAS (G3P V)
Acid-base deficit: 3 — ABNORMAL HIGH
Bicarbonate: 21.9
O2 Saturation: 86
Operator id: 3342
TCO2: 23
pCO2, Ven: 37.2 — ABNORMAL LOW
pH, Ven: 7.377 — ABNORMAL HIGH
pO2, Ven: 52 — ABNORMAL HIGH

## 2011-03-05 LAB — PROTIME-INR
INR: 1.6 — ABNORMAL HIGH
Prothrombin Time: 20 — ABNORMAL HIGH

## 2011-03-05 LAB — CREATININE, SERUM
Creatinine, Ser: 0.67
Creatinine, Ser: 0.71
GFR calc Af Amer: >60
GFR calc Af Amer: >60
GFR calc non Af Amer: >60
GFR calc non Af Amer: >60

## 2011-03-05 LAB — MAGNESIUM
Magnesium: 2
Magnesium: 2
Magnesium: 2.1

## 2011-03-05 LAB — PLATELET COUNT: Platelets: 158

## 2011-03-05 LAB — HEMOGLOBIN AND HEMATOCRIT, BLOOD
HCT: 28.4 — ABNORMAL LOW
Hemoglobin: 9.7 — ABNORMAL LOW

## 2011-03-05 LAB — CK TOTAL AND CKMB (NOT AT ARMC)
CK, MB: 10.6 — ABNORMAL HIGH
Relative Index: 5.5 — ABNORMAL HIGH
Total CK: 194 — ABNORMAL HIGH

## 2011-03-05 LAB — APTT: aPTT: 45 — ABNORMAL HIGH

## 2011-03-06 LAB — COMPREHENSIVE METABOLIC PANEL
ALT: 16
AST: 19
Albumin: 3.7
Alkaline Phosphatase: 47
BUN: 11
CO2: 24
Calcium: 9.2
Chloride: 106
Creatinine, Ser: 0.61
GFR calc Af Amer: >60
GFR calc non Af Amer: >60
Glucose, Bld: 117 — ABNORMAL HIGH
Potassium: 3.8
Sodium: 138
Total Bilirubin: 0.7
Total Protein: 6.4

## 2011-03-06 LAB — BLOOD GAS, ARTERIAL
Acid-Base Excess: 0.5
Bicarbonate: 23.9
Drawn by: 206361
FIO2: 0.21
O2 Saturation: 99
Patient temperature: 98.6
TCO2: 24.9
pCO2 arterial: 33.7 — ABNORMAL LOW
pH, Arterial: 7.464 — ABNORMAL HIGH
pO2, Arterial: 113 — ABNORMAL HIGH

## 2011-03-06 LAB — TYPE AND SCREEN
ABO/RH(D): O POS
Antibody Screen: NEGATIVE

## 2011-03-06 LAB — URINALYSIS, ROUTINE W REFLEX MICROSCOPIC
Bilirubin Urine: NEGATIVE
Glucose, UA: NEGATIVE
Hgb urine dipstick: NEGATIVE
Ketones, ur: NEGATIVE
Nitrite: NEGATIVE
Protein, ur: NEGATIVE
Specific Gravity, Urine: 1.026
Urobilinogen, UA: 1
pH: 6.5

## 2011-03-06 LAB — POCT I-STAT 3, VENOUS BLOOD GAS (G3P V)
O2 Saturation: 77
Operator id: 284701
pCO2, Ven: 34.9 — ABNORMAL LOW
pO2, Ven: 42

## 2011-03-06 LAB — APTT: aPTT: 29

## 2011-03-06 LAB — CBC
HCT: 38.4
Hemoglobin: 13.1
MCHC: 34.2
MCV: 92
Platelets: 196
RBC: 4.17
RDW: 11.9
WBC: 7

## 2011-03-06 LAB — PROTIME-INR
INR: 1
Prothrombin Time: 13.2

## 2011-03-06 LAB — POCT I-STAT 3, ART BLOOD GAS (G3+)
TCO2: 22
pCO2 arterial: 33.5 — ABNORMAL LOW
pH, Arterial: 7.412 — ABNORMAL HIGH
pO2, Arterial: 74 — ABNORMAL LOW

## 2011-03-06 LAB — ABO/RH: ABO/RH(D): O POS

## 2011-03-06 LAB — HEMOGLOBIN A1C: Hgb A1c MFr Bld: 5.2

## 2011-03-06 LAB — HCG, SERUM, QUALITATIVE: Preg, Serum: NEGATIVE

## 2011-08-07 ENCOUNTER — Ambulatory Visit (INDEPENDENT_AMBULATORY_CARE_PROVIDER_SITE_OTHER): Payer: BC Managed Care – PPO | Admitting: Physician Assistant

## 2011-08-07 VITALS — BP 119/76 | HR 112 | Temp 100.1°F | Resp 16 | Ht 66.5 in | Wt 176.0 lb

## 2011-08-07 DIAGNOSIS — E049 Nontoxic goiter, unspecified: Secondary | ICD-10-CM

## 2011-08-07 DIAGNOSIS — R509 Fever, unspecified: Secondary | ICD-10-CM

## 2011-08-07 DIAGNOSIS — B9789 Other viral agents as the cause of diseases classified elsewhere: Secondary | ICD-10-CM

## 2011-08-07 DIAGNOSIS — B349 Viral infection, unspecified: Secondary | ICD-10-CM

## 2011-08-07 LAB — POCT CBC
HCT, POC: 40.6 % (ref 37.7–47.9)
Hemoglobin: 13.3 g/dL (ref 12.2–16.2)
MPV: 10.2 fL (ref 0–99.8)
POC Granulocyte: 1.4 — AB (ref 2–6.9)
POC MID %: 14.3 %M — AB (ref 0–12)
RBC: 4.52 M/uL (ref 4.04–5.48)

## 2011-08-07 MED ORDER — ALBUTEROL SULFATE HFA 108 (90 BASE) MCG/ACT IN AERS: 2.0000 | INHALATION_SPRAY | RESPIRATORY_TRACT | Status: DC | PRN

## 2011-08-07 NOTE — Progress Notes (Signed)
  Subjective:    Patient ID: [name] Short, female    DOB: [birth year], 41 y.o.   MRN: [id number]  HPI Khandi comes in today c/o cough, fever, chills, myalgias, HA.  Initially had cold sx for a week and felt she was improving last Saturday.  Cough worsened Sunday and has had 2-3 days of fever (100-102).  No SOB or wheezing. She did not receive flu shot this season.  Advil and Tylenol has helped but fever and HA return when wears off.   Had Bicuspid and Aortic valve replacement 2008 for "leaky valves" aortic aneurysm. Followed by Dr. Tresa Endo.  Additionally, she was diagnosed with goiters years ago while trying to get pregnant.  At the time of surgical consultation after bx, she became pregnant and did not have surgery.  Goiters were felt to be benign but slightly concerning,  Had seen Dr. Lucianne Muss in the past but feels she would like to see Dr. Talmage Nap at this point.  Requests referral for this.  Gyn is main provider and she states she would consider [place] her primary even though she has not been seen here that often.     Review of Systems As above   Objective:   Physical Exam  Constitutional: She appears well-developed and well-nourished.  HENT:  Right Ear: Tympanic membrane normal.  Left Ear: Tympanic membrane normal.  Nose: Mucosal edema and rhinorrhea present.  Mouth/Throat: No posterior oropharyngeal erythema.  Cardiovascular: Normal rate and regular rhythm.   Pulmonary/Chest: Effort normal and breath sounds normal.       Tight cough but no wheezing.  Lymphadenopathy:    She has no cervical adenopathy.     Results for orders placed in visit on 08/07/11  POCT CBC      Component Value Range   WBC 2.3 (*) 4.6 - 10.2 (K/uL)   Lymph, poc 0.6  0.6 - 3.4    POC LYMPH PERCENT 26.4  10 - 50 (%L)   MID (cbc) 0.3  0 - 0.9    POC MID % 14.3 (*) 0 - 12 (%M)   POC Granulocyte 1.4 (*) 2 - 6.9    Granulocyte percent 59.3  37 - 80 (%G)   RBC 4.52  4.04 - 5.48 (M/uL)   Hemoglobin 13.3  12.2 - 16.2  (g/dL)   HCT, POC 40.9  81.1 - 47.9 (%)   MCV 89.9  80 - 97 (fL)   MCH, POC 29.4  27 - 31.2 (pg)   MCHC 32.8  31.8 - 35.4 (g/dL)   RDW, POC 91.4     Platelet Count, POC 143  142 - 424 (K/uL)   MPV 10.2  0 - 99.8 (fL)        Assessment & Plan:  Viral illness, likely influenza Valve replacement, history of Goiters  Pt is nursing.  Pro Air and whatever cough remedy she uses is fine.  Monitor fever and SOB. Tylenol every 4 hours.  Watch for worsening wheezing, SOB, persistent fever, Chest Pain. Refer to Endocrinolgy, Dr. Talmage Nap for reevaluation of goiters.

## 2011-08-27 ENCOUNTER — Other Ambulatory Visit: Payer: Self-pay | Admitting: Obstetrics and Gynecology

## 2011-10-09 ENCOUNTER — Other Ambulatory Visit: Payer: Self-pay | Admitting: Endocrinology

## 2011-10-09 DIAGNOSIS — E049 Nontoxic goiter, unspecified: Secondary | ICD-10-CM

## 2011-10-16 ENCOUNTER — Ambulatory Visit
Admission: RE | Admit: 2011-10-16 | Discharge: 2011-10-16 | Disposition: A | Discharge status: Home / Self Care | Payer: BC Managed Care – PPO | Source: Ambulatory Visit | Attending: Endocrinology | Admitting: Endocrinology

## 2011-10-16 DIAGNOSIS — E049 Nontoxic goiter, unspecified: Secondary | ICD-10-CM

## 2011-10-23 ENCOUNTER — Other Ambulatory Visit: Payer: Self-pay | Admitting: Otolaryngology

## 2011-10-24 ENCOUNTER — Other Ambulatory Visit: Payer: Self-pay | Admitting: Emergency Medicine

## 2011-10-24 DIAGNOSIS — D497 Neoplasm of unspecified behavior of endocrine glands and other parts of nervous system: Secondary | ICD-10-CM

## 2011-11-01 ENCOUNTER — Ambulatory Visit
Admission: RE | Admit: 2011-11-01 | Discharge: 2011-11-01 | Disposition: A | Discharge status: Home / Self Care | Payer: BC Managed Care – PPO | Source: Ambulatory Visit | Attending: Emergency Medicine | Admitting: Emergency Medicine

## 2011-11-01 DIAGNOSIS — D497 Neoplasm of unspecified behavior of endocrine glands and other parts of nervous system: Secondary | ICD-10-CM

## 2011-11-01 MED ORDER — GADOBENATE DIMEGLUMINE 529 MG/ML IV SOLN
16.0000 mL | Freq: Once | INTRAVENOUS | Status: AC | PRN
  Administered 2011-11-01: 16 mL via INTRAVENOUS

## 2011-11-06 ENCOUNTER — Ambulatory Visit (INDEPENDENT_AMBULATORY_CARE_PROVIDER_SITE_OTHER): Payer: BC Managed Care – PPO | Admitting: Surgery

## 2011-12-15 ENCOUNTER — Encounter (HOSPITAL_COMMUNITY): Payer: Self-pay | Admitting: Pharmacy Technician

## 2011-12-22 NOTE — Consult Note (Addendum)
Anesthesia Chart Review:  Patient is a 41 year old female scheduled for thyroidectomy by Dr. Pollyann Kennedy on 12/31/11.  Her PAT appointment is on 12/24/11.  Today, I am reviewing her Cardiac clearance records.  According to currently available Cardiology notes, her history includes bicuspid AV with subsequent development of severe AI and aortic root dilatation.  She underwent replacement on her ascending aorta with a 30 mm Hemashield Dacron graft supracoronary, as well as replacement of her AV with 25 mm [employer] pericardial tissue valve.  She also has multinodular goiter.  She was scheduled for a thyroidectomy previously, but this was canceled due to pregnancy.  She has since had a daughter in 2011 via c-section.    Her Cardiologist is Dr. Nicki Guadalajara [facility]).  He last saw her on 08/06/11.  EKG then showed NSR.  He ordered an abdominal ultrasound that was done in April and was essentially normal.  Her echo on 08/21/11 showed all chambers with normal size and function, postoperative paradoxical septal motion, AV bioprosthesis with normal structure and function, other valves normal, no pericardial effusion, aortic root is normal size.  EF > 55%.  He has since cleared her for thyroidectomy with low CV risk.  Cardiac cath on (pre-AVR) 10/23/06 showed: 1. Congenital heart disease with a bicuspid aortic valve, followed since age 1.  2. Asymptomatic aortic regurgitation, severe by angiography.  3. Angiographically patent coronary arteries with no evidence of any significant stenoses.  4. Low-normal to normal LV systolic function.  5. No mitral regurgitation.  6. Normal cardiac outputs.   CXR and labs are pending her PAT appointment.  Shonna Chock, PA-C 12/22/11 1630  Addendum: 12/24/11 1710 Labs from today noted.    CXR from 12/24/11 showed Prior valve replacement. No acute findings.   Of note, patient told her PAT RN that she was breastfeeding still.  Anticipate she can proceed as planned.

## 2011-12-24 ENCOUNTER — Encounter (HOSPITAL_COMMUNITY)
Admission: RE | Admit: 2011-12-24 | Discharge: 2011-12-24 | Disposition: A | Discharge status: Home / Self Care | Payer: BC Managed Care – PPO | Source: Ambulatory Visit | Attending: Otolaryngology | Admitting: Otolaryngology

## 2011-12-24 ENCOUNTER — Encounter (HOSPITAL_COMMUNITY): Payer: Self-pay

## 2011-12-24 HISTORY — DX: Other specified postprocedural states: Z98.890

## 2011-12-24 HISTORY — DX: Other specified postprocedural states: R11.2

## 2011-12-24 HISTORY — DX: Nontoxic multinodular goiter: E04.2

## 2011-12-24 HISTORY — DX: Congenital malformation of heart, unspecified: Q24.9

## 2011-12-24 LAB — COMPREHENSIVE METABOLIC PANEL
ALT: 8 U/L (ref 0–35)
AST: 16 U/L (ref 0–37)
Alkaline Phosphatase: 65 U/L (ref 39–117)
CO2: 28 mEq/L (ref 19–32)
Chloride: 104 mEq/L (ref 96–112)
Creatinine, Ser: 0.75 mg/dL (ref 0.50–1.10)
GFR calc non Af Amer: >90 mL/min (ref >90)
Potassium: 4.2 mEq/L (ref 3.5–5.1)
Sodium: 141 mEq/L (ref 135–145)
Total Bilirubin: 0.3 mg/dL (ref 0.3–1.2)

## 2011-12-24 LAB — SURGICAL PCR SCREEN
MRSA, PCR: NEGATIVE
Staphylococcus aureus: POSITIVE — AB

## 2011-12-24 LAB — HCG, SERUM, QUALITATIVE: Preg, Serum: NEGATIVE

## 2011-12-24 LAB — CBC
MCH: 30.6 pg (ref 26.0–34.0)
MCHC: 34.6 g/dL (ref 30.0–36.0)
MCV: 88.4 fL (ref 78.0–100.0)
Platelets: 178 10*3/uL (ref 150–400)

## 2011-12-24 NOTE — Progress Notes (Signed)
Patient requested not to have CXR unless needed. Spoke with Revonda Standard and Dr. Jean Rosenthal and requested that CXR be completed

## 2011-12-24 NOTE — Pre-Procedure Instructions (Signed)
20 [name]  12/24/2011   Your procedure is scheduled on:  August 14  Report to [facility] at 06:30 AM.  Call this number if you have problems the morning of surgery: [phone]   Remember:   Do not eat or drink:After Midnight.  Clear liquids include soda, tea, black coffee, apple or grape juice, broth.  Take these medicines the morning of surgery with A SIP OF WATER: None   Do not wear jewelry, make-up or nail polish.  Do not wear lotions, powders, or perfumes. You may wear deodorant.  Do not shave 48 hours prior to surgery. Men may shave face and neck.  Do not bring valuables to the hospital.  Contacts, dentures or bridgework may not be worn into surgery.  Leave suitcase in the car. After surgery it may be brought to your room.  For patients admitted to the hospital, checkout time is 11:00 AM the day of discharge.   Special Instructions: CHG Shower Use Special Wash: 1/2 bottle night before surgery and 1/2 bottle morning of surgery.   Please read over the following fact sheets that you were given: Pain Booklet, Coughing and Deep Breathing and Surgical Site Infection Prevention

## 2011-12-26 NOTE — Progress Notes (Addendum)
Received call from patient.  States after taking mupirocin experienced tingling lips, tongue and teeth.  Contacted pharmacist instructed to follow up with Dr. Pollyann Kennedy for advisement. Contacted Dr. Lucky Rathke office, spoke with Jasmine December notified of reaction.  Spoke with Victorino Dike in infectious disease advised patient to not take mupirocin.  Contacted patient to advise.

## 2011-12-28 NOTE — H&P (Signed)
Assessment  . History of aneurysm of the aortic root   (441.2); Resolved: [id number] . Nontoxic multinodular goiter   (241.1) . Neoplasm located in the thyroid gland   (239.7) Discussed  She has a multinodular thyroid goiter, with a couple of nodules larger than 4 cm. FNA from 2000 and revealed follicular cells with micro-follicular pattern. At least one of the lesions has gotten larger since then. The certainly warrants thyroidectomy for diagnostic and therapeutic reasons. We had a lengthy discussion of the nature of the surgery including the incision, the need for a drain, the risks of bleeding, infection, specifically risks of recurrent nerve injury and hypocalcemia. I will discuss with her cardiologist and with Dr. Lowella Fairy if there are any specific considerations. She wants to schedule in August. Reason For Visit  [name] Short is here today at the kind request of Balan, B[place] for consultation and opinion for thyroid nodule. HPI  History of multinodular thyroid goiter. FNA in 2010 with follicular cells and micro-follicular pattern. Recent ultrasound reveals enlargement of at least one of the nodules. She had to put off surgery because she underwent aortic valve replacement and aortic aneurysm repair. She has done quite well from that and then she had a baby. She is now ready to undergo surgery. She has been euthyroid. Allergies  Amoxicillin TABS. Current Meds  No Reported Medications;; RPT. PSH  Cesarean Section Gynecologic Surgery; fibroid tumor from uterus Heart Valve Replacement (V43.3) Oral Surgery Tooth Extraction. Family Hx  Family history of Hypertension Family history of Migraine Headache Family history of Prostate Cancer Family history of Reported Family History Of Allergies. Personal Hx  Being A Social Drinker Never A Smoker. ROS  Psychological: Anxiety. 12 system ROS was obtained and reviewed on the Health Maintenance form dated today.  Positive responses are shown  above.  If the symptom is not checked, the patient has denied it. Vital Signs   Recorded by Skolimowski,Sharon on 21 Oct 2011 10:30 AM BP:110/60,  Height: 66.5 in, Weight: 176 lb, BMI: 28 kg/m2,  BSA Calculated: 1.91 ,  BMI Calculated: 27.98. Physical Exam  APPEARANCE: Well developed, well nourished, in no acute distress.  Normal affect, in a pleasant mood.  Oriented to time, place and person. COMMUNICATION: Normal voice   HEAD & FACE:  No scars, lesions or masses of head and face.  Sinuses nontender to palpation.  Salivary glands without mass or tenderness.  Facial strength symmetric.  No facial lesion, scars, or mass. EYES: EOMI with normal primary gaze alignment. Visual acuity grossly intact.  PERRLA EXTERNAL EAR & NOSE: No scars, lesions or masses  EAC & TYMPANIC MEMBRANE:  EAC shows no obstructing lesions or debris and tympanic membranes are normal bilaterally with good movement to insufflation. GROSS HEARING: Normal  TMJ:  Nontender  INTRANASAL EXAM: No polyps or purulence.  NASOPHARYNX: Normal, without lesions. LIPS, TEETH & GUMS: No lip lesions, normal dentition and normal gums. ORAL CAVITY/OROPHARYNX:  Oral mucosa moist without lesion or asymmetry of the palate, tongue, tonsil or posterior pharynx. LARYNX (mirror exam):  No lesions of the epiglottis, false cord or TVC's and cords move well to phonation. HYPOPHARYNX (mirror exam): No lesions, asymmetry or pooling of secretions. NECK/THYROID: Multiple thyroid nodules palpable, the largest is in the right side of the isthmus and is about 3 or 4 cm. There is a fullness in the supra-sternal area that is pulsatile. I'm not sure if this is the innominate artery or possibly related to her surgery. NEUROLOGIC:  No gross  CN deficits. No nystagmus noted.   LYMPHATIC:  No enlarged nodes palpable.

## 2011-12-30 MED ORDER — VANCOMYCIN HCL IN DEXTROSE 1-5 GM/200ML-% IV SOLN
1000.0000 mg | INTRAVENOUS | Status: AC
  Administered 2011-12-31: 1000 mg via INTRAVENOUS
  Filled 2011-12-30: qty 200

## 2011-12-31 ENCOUNTER — Encounter (HOSPITAL_COMMUNITY)
Admission: RE | Disposition: A | Discharge status: Home / Self Care | Payer: Self-pay | Source: Ambulatory Visit | Attending: Otolaryngology

## 2011-12-31 ENCOUNTER — Ambulatory Visit (HOSPITAL_COMMUNITY)
Admission: RE | Admit: 2011-12-31 | Discharge: 2012-01-01 | Disposition: A | Discharge status: Home / Self Care | Payer: BC Managed Care – PPO | Source: Ambulatory Visit | Attending: Otolaryngology | Admitting: Otolaryngology

## 2011-12-31 ENCOUNTER — Encounter (HOSPITAL_COMMUNITY): Payer: Self-pay

## 2011-12-31 ENCOUNTER — Ambulatory Visit (HOSPITAL_COMMUNITY): Payer: BC Managed Care – PPO | Admitting: Vascular Surgery

## 2011-12-31 ENCOUNTER — Encounter (HOSPITAL_COMMUNITY): Payer: Self-pay | Admitting: *Deleted

## 2011-12-31 ENCOUNTER — Encounter (HOSPITAL_COMMUNITY): Payer: Self-pay | Admitting: Vascular Surgery

## 2011-12-31 DIAGNOSIS — E049 Nontoxic goiter, unspecified: Secondary | ICD-10-CM | POA: Insufficient documentation

## 2011-12-31 DIAGNOSIS — Z01818 Encounter for other preprocedural examination: Secondary | ICD-10-CM | POA: Insufficient documentation

## 2011-12-31 DIAGNOSIS — E890 Postprocedural hypothyroidism: Secondary | ICD-10-CM

## 2011-12-31 DIAGNOSIS — Z01812 Encounter for preprocedural laboratory examination: Secondary | ICD-10-CM | POA: Insufficient documentation

## 2011-12-31 DIAGNOSIS — Z9889 Other specified postprocedural states: Secondary | ICD-10-CM

## 2011-12-31 HISTORY — PX: THYROID LOBECTOMY: SHX420

## 2011-12-31 SURGERY — LOBECTOMY, THYROID
Anesthesia: General | Site: Neck | Laterality: Right | Wound class: Clean

## 2011-12-31 MED ORDER — PROMETHAZINE HCL 25 MG RE SUPP: 25.0000 mg | Freq: Four times a day (QID) | RECTAL | Status: DC | PRN

## 2011-12-31 MED ORDER — MEPERIDINE HCL 25 MG/ML IJ SOLN: 6.2500 mg | INTRAMUSCULAR | Status: DC | PRN

## 2011-12-31 MED ORDER — PROMETHAZINE HCL 25 MG PO TABS: 25.0000 mg | ORAL_TABLET | Freq: Four times a day (QID) | ORAL | Status: DC | PRN

## 2011-12-31 MED ORDER — IBUPROFEN 100 MG/5ML PO SUSP
400.0000 mg | Freq: Four times a day (QID) | ORAL | Status: DC | PRN
  Filled 2011-12-31: qty 20

## 2011-12-31 MED ORDER — LIDOCAINE HCL (CARDIAC) 20 MG/ML IV SOLN
INTRAVENOUS | Status: DC | PRN
  Administered 2011-12-31: 75 mg via INTRAVENOUS

## 2011-12-31 MED ORDER — LIDOCAINE-EPINEPHRINE 1 %-1:100000 IJ SOLN: INTRAMUSCULAR | Status: DC | PRN

## 2011-12-31 MED ORDER — PROMETHAZINE HCL 25 MG/ML IJ SOLN: 6.2500 mg | INTRAMUSCULAR | Status: DC | PRN

## 2011-12-31 MED ORDER — NEOSTIGMINE METHYLSULFATE 1 MG/ML IJ SOLN
INTRAMUSCULAR | Status: DC | PRN
  Administered 2011-12-31: 3 mg via INTRAVENOUS

## 2011-12-31 MED ORDER — VECURONIUM BROMIDE 10 MG IV SOLR
INTRAVENOUS | Status: DC | PRN
  Administered 2011-12-31: 5 mg via INTRAVENOUS
  Administered 2011-12-31: 2 mg via INTRAVENOUS

## 2011-12-31 MED ORDER — MIDAZOLAM HCL 5 MG/5ML IJ SOLN
INTRAMUSCULAR | Status: DC | PRN
  Administered 2011-12-31: 2 mg via INTRAVENOUS

## 2011-12-31 MED ORDER — LACTATED RINGERS IV SOLN: INTRAVENOUS | Status: DC

## 2011-12-31 MED ORDER — GLYCOPYRROLATE 0.2 MG/ML IJ SOLN
INTRAMUSCULAR | Status: DC | PRN
  Administered 2011-12-31: 0.3 mg via INTRAVENOUS

## 2011-12-31 MED ORDER — FENTANYL CITRATE 0.05 MG/ML IJ SOLN
INTRAMUSCULAR | Status: DC | PRN
  Administered 2011-12-31 (×6): 50 ug via INTRAVENOUS

## 2011-12-31 MED ORDER — HYDROCODONE-ACETAMINOPHEN 5-325 MG PO TABS
1.0000 | ORAL_TABLET | ORAL | Status: DC | PRN
  Administered 2011-12-31 – 2012-01-01 (×3): 1 via ORAL
  Filled 2011-12-31 (×3): qty 1

## 2011-12-31 MED ORDER — LIDOCAINE-EPINEPHRINE 1 %-1:100000 IJ SOLN
INTRAMUSCULAR | Status: AC
  Filled 2011-12-31: qty 1

## 2011-12-31 MED ORDER — SCOPOLAMINE 1 MG/3DAYS TD PT72
MEDICATED_PATCH | TRANSDERMAL | Status: DC | PRN
  Administered 2011-12-31: 1.5 mg via TRANSDERMAL

## 2011-12-31 MED ORDER — PHENYLEPHRINE HCL 10 MG/ML IJ SOLN
INTRAMUSCULAR | Status: DC | PRN
  Administered 2011-12-31: 80 ug via INTRAVENOUS

## 2011-12-31 MED ORDER — ONDANSETRON HCL 4 MG/2ML IJ SOLN
INTRAMUSCULAR | Status: DC | PRN
  Administered 2011-12-31: 4 mg via INTRAVENOUS

## 2011-12-31 MED ORDER — DEXAMETHASONE SODIUM PHOSPHATE 10 MG/ML IJ SOLN
INTRAMUSCULAR | Status: DC | PRN
  Administered 2011-12-31: 8 mg via INTRAVENOUS

## 2011-12-31 MED ORDER — LACTATED RINGERS IV SOLN
INTRAVENOUS | Status: DC | PRN
  Administered 2011-12-31 (×2): via INTRAVENOUS

## 2011-12-31 MED ORDER — HYDROCODONE-ACETAMINOPHEN 7.5-500 MG PO TABS: 1.0000 | ORAL_TABLET | Freq: Four times a day (QID) | ORAL | Status: AC | PRN

## 2011-12-31 MED ORDER — HYDROMORPHONE HCL PF 1 MG/ML IJ SOLN
INTRAMUSCULAR | Status: AC
  Filled 2011-12-31: qty 1

## 2011-12-31 MED ORDER — PROPOFOL 10 MG/ML IV EMUL
INTRAVENOUS | Status: DC | PRN
  Administered 2011-12-31: 150 mg via INTRAVENOUS
  Administered 2011-12-31: 50 mg via INTRAVENOUS

## 2011-12-31 MED ORDER — DEXTROSE-NACL 5-0.9 % IV SOLN
INTRAVENOUS | Status: DC
  Administered 2011-12-31 – 2012-01-01 (×2): via INTRAVENOUS

## 2011-12-31 MED ORDER — HYDROMORPHONE HCL PF 1 MG/ML IJ SOLN: 0.2500 mg | INTRAMUSCULAR | Status: DC | PRN

## 2011-12-31 MED ORDER — SODIUM CHLORIDE 0.9 % IR SOLN
Status: DC | PRN
  Administered 2011-12-31: 1

## 2011-12-31 MED ORDER — HYDROMORPHONE HCL PF 1 MG/ML IJ SOLN
0.2500 mg | INTRAMUSCULAR | Status: DC | PRN
  Administered 2011-12-31: 0.5 mg via INTRAVENOUS

## 2011-12-31 MED ORDER — BACITRACIN ZINC 500 UNIT/GM EX OINT
TOPICAL_OINTMENT | CUTANEOUS | Status: AC
  Filled 2011-12-31: qty 15

## 2011-12-31 SURGICAL SUPPLY — 50 items
ADH SKN CLS APL DERMABOND .7 (GAUZE/BANDAGES/DRESSINGS) ×2
APPLIER CLIP 9.375 SM OPEN (CLIP)
APR CLP SM 9.3 20 MLT OPN (CLIP)
ATTRACTOMAT 16X20 MAGNETIC DRP (DRAPES) ×2 IMPLANT
CANISTER SUCTION 2500CC (MISCELLANEOUS) ×3 IMPLANT
CLEANER TIP ELECTROSURG 2X2 (MISCELLANEOUS) ×3 IMPLANT
CLIP APPLIE 9.375 SM OPEN (CLIP) IMPLANT
CLOTH BEACON ORANGE TIMEOUT ST (SAFETY) ×3 IMPLANT
CONT SPEC 4OZ CLIKSEAL STRL BL (MISCELLANEOUS) ×3 IMPLANT
CORDS BIPOLAR (ELECTRODE) ×3 IMPLANT
COVER SURGICAL LIGHT HANDLE (MISCELLANEOUS) ×3 IMPLANT
DECANTER SPIKE VIAL GLASS SM (MISCELLANEOUS) ×3 IMPLANT
DERMABOND ADVANCED (GAUZE/BANDAGES/DRESSINGS) ×1
DERMABOND ADVANCED .7 DNX12 (GAUZE/BANDAGES/DRESSINGS) ×1 IMPLANT
DRAIN CHANNEL 15F RND FF W/TCR (WOUND CARE) ×2 IMPLANT
DRAIN SNY 10 ROU (WOUND CARE) IMPLANT
ELECT COATED BLADE 2.86 ST (ELECTRODE) ×3 IMPLANT
ELECT REM PT RETURN 9FT ADLT (ELECTROSURGICAL) ×3
ELECTRODE REM PT RTRN 9FT ADLT (ELECTROSURGICAL) ×2 IMPLANT
EVACUATOR SILICONE 100CC (DRAIN) ×3 IMPLANT
GAUZE SPONGE 4X4 16PLY XRAY LF (GAUZE/BANDAGES/DRESSINGS) ×11 IMPLANT
GLOVE BIO SURGEON STRL SZ 6.5 (GLOVE) ×2 IMPLANT
GLOVE BIOGEL PI IND STRL 7.5 (GLOVE) ×1 IMPLANT
GLOVE BIOGEL PI INDICATOR 7.5 (GLOVE) ×1
GLOVE ECLIPSE 7.5 STRL STRAW (GLOVE) ×3 IMPLANT
GLOVE SURG SS PI 6.5 STRL IVOR (GLOVE) ×2 IMPLANT
GLOVE SURG SS PI 7.0 STRL IVOR (GLOVE) ×2 IMPLANT
GOWN STRL NON-REIN LRG LVL3 (GOWN DISPOSABLE) ×8 IMPLANT
KIT BASIN OR (CUSTOM PROCEDURE TRAY) ×3 IMPLANT
KIT ROOM TURNOVER OR (KITS) ×3 IMPLANT
NEEDLE 27GAX1X1/2 (NEEDLE) ×3 IMPLANT
NS IRRIG 1000ML POUR BTL (IV SOLUTION) ×3 IMPLANT
PAD ARMBOARD 7.5X6 YLW CONV (MISCELLANEOUS) ×6 IMPLANT
PENCIL FOOT CONTROL (ELECTRODE) ×3 IMPLANT
SOLUTION BETADINE 4OZ (MISCELLANEOUS) ×2 IMPLANT
SPECIMEN JAR MEDIUM (MISCELLANEOUS) ×1 IMPLANT
SPONGE INTESTINAL PEANUT (DISPOSABLE) IMPLANT
STAPLER VISISTAT 35W (STAPLE) ×3 IMPLANT
SUT CHROMIC 3 0 SH 27 (SUTURE) IMPLANT
SUT CHROMIC 4 0 PS 2 18 (SUTURE) ×6 IMPLANT
SUT ETHILON 3 0 PS 1 (SUTURE) ×2 IMPLANT
SUT ETHILON 5 0 P 3 18 (SUTURE) ×1
SUT NYLON ETHILON 5-0 P-3 1X18 (SUTURE) ×2 IMPLANT
SUT SILK 3 0 REEL (SUTURE) IMPLANT
SUT SILK 4 0 REEL (SUTURE) ×5 IMPLANT
TOWEL OR 17X24 6PK STRL BLUE (TOWEL DISPOSABLE) ×3 IMPLANT
TOWEL OR 17X26 10 PK STRL BLUE (TOWEL DISPOSABLE) ×3 IMPLANT
TOWEL OR NON WOVEN STRL DISP B (DISPOSABLE) ×2 IMPLANT
TRAY ENT MC OR (CUSTOM PROCEDURE TRAY) ×3 IMPLANT
WATER STERILE IRR 1000ML POUR (IV SOLUTION) ×3 IMPLANT

## 2011-12-31 NOTE — Transfer of Care (Signed)
Immediate Anesthesia Transfer of Care Note  Patient: [name] Short  Procedure(s) Performed: Procedure(s) (LRB): THYROID LOBECTOMY (Right)  Patient Location: PACU  Anesthesia Type: General  Level of Consciousness: awake, alert  and oriented  Airway & Oxygen Therapy: Patient Spontanous Breathing and Patient connected to nasal cannula oxygen  Post-op Assessment: Report given to PACU RN, Post -op Vital signs reviewed and stable, Patient moving all extremities, Patient moving all extremities X 4 and Patient able to stick tongue midline  Post vital signs: Reviewed and stable  Complications: No apparent anesthesia complications

## 2011-12-31 NOTE — Anesthesia Postprocedure Evaluation (Signed)
  Anesthesia Post-op Note  Patient: [name] Short  Procedure(s) Performed: Procedure(s) (LRB): THYROID LOBECTOMY (Right)  Patient Location: PACU  Anesthesia Type: General  Level of Consciousness: awake  Airway and Oxygen Therapy: Patient Spontanous Breathing  Post-op Pain: mild  Post-op Assessment: Post-op Vital signs reviewed  Post-op Vital Signs: stable  Complications: No apparent anesthesia complications

## 2011-12-31 NOTE — Op Note (Signed)
OPERATIVE REPORT  DATE OF SURGERY: 12/31/2011  PATIENT:  [name],  41 y.o. female  PRE-OPERATIVE DIAGNOSIS:  Thyroid Mass  POST-OPERATIVE DIAGNOSIS:  Thyroid Mass  PROCEDURE:  Procedure(s): THYROID LOBECTOMY  SURGEON:  Susy Frizzle, MD  ASSISTANTS: Beckey Rutter, PA   ANESTHESIA:   general  EBL:  250 ml  DRAINS: (15 [place]) Jackson-Pratt drain(s) with closed bulb suction in the neck   LOCAL MEDICATIONS USED:  NONE  SPECIMEN:  Source of Specimen:  Right thyroid lobe  COUNTS:  YES  PROCEDURE DETAILS: The patient was taken to the operating room and placed on the operating table in the supine position. Following induction of general endotracheal anesthesia, a shoulder roll was placed beneath the shoulder blades to extend the neck. The neck was prepped and draped in a standard fashion. A low collar transverse incision was outlined with a marking pen. A #15 scalpel was used to incise the skin and subcutaneous tissue. Continued dissection was performed using electrocautery down to the platysma layer. Subplatysmal flaps were developed superiorly to the thyroid cartilage and inferiorly to the clavicle. A thyroid self-retaining retractor was used throughout the case. The midline fascia was divided. The diastases of the strap muscles was identified and divided as well. The anatomy was severely distorted because of this very large goiter is mass. It was not clear initially if this was the right lobe were the left were both. The strap muscles were reflected laterally on both sides. The lobe was retracted in both directions to expose surrounding tissues. The substernal component was at least 6-7 cm and this was dissected out using blunt dissection. Dissection was performed on the capsule of the gland and the vascular attachments were ligated between clamps and divided. There is a separate large nodule superiorly that was either the right lobe or possibly a separate nodule from the superior  pole. All this was dissected down inferiorly and brought forward as the vessels were ligated. After the entire lobe was brought forward what appeared to be recurrent nerve was identified and was kept intact. The superior parathyroid was also identified and was kept intact with its blood supply. The isthmus was divided about 2 cm left of midline. The specimen was removed and sent for pathologic evaluation. There were multiple soft and a couple of small firm nodules present. Frozen section was not obtained. The left side was inspected and at this point I was able to identify the left lobe which was very small but did contain several very small firm nodules. I decided to leave this lobe intact given the small size. The wound is irrigated with saline. Hemostasis was completed using cautery and silk ties as needed. A 15 French round fluted drain was left in the wound exiting through the left-sided incision and secured in place with a nylon suture. The midline fascia was reapproximated with interrupted 4-0 chromic as was the platysma layer. A subcuticular closure was also performed and then Dermabond was used on the skin. The patient was then awakened extubated and transferred to recovery in stable condition.   PATIENT DISPOSITION:  PACU - hemodynamically stable.

## 2011-12-31 NOTE — Progress Notes (Signed)
12/31/2011 6:42 PM  [name] Short [id number]  Post-Op Check    Temp:  [97.6 F (36.4 C)-98.7 F (37.1 C)] 97.6 F (36.4 C) (08/14 1402) Pulse Rate:  [73-108] 79  (08/14 1402) Resp:  [10-18] 18  (08/14 1402) BP: (106-123)/(67-78) 120/70 mmHg (08/14 1402) SpO2:  [100 %] 100 % (08/14 1402),     Intake/Output Summary (Last 24 hours) at 12/31/11 1842 Last data filed at 12/31/11 1613  Gross per 24 hour  Intake 1931.25 ml  Output   1469 ml  Net 462.25 ml  Drain 70 ml  No results found for this or any previous visit (from the past 24 hour(s)).  SUBJECTIVE:  Breathing well.  Throat sl scratchy.  No swallowing diff.  No N,V.  Spont void.  OBJECTIVE:  Voice strong and clear.  Wound flat and intact.  Drain functioning  IMPRESSION:  Satisfactory post op check  PLAN:  Routine. Probably drain out and D/C home in AM.  [place], [place]

## 2011-12-31 NOTE — Preoperative (Signed)
Beta Blockers   Reason not to administer Beta Blockers:Not Applicable. No home beta blockers 

## 2011-12-31 NOTE — Anesthesia Procedure Notes (Signed)
Procedure Name: Intubation Date/Time: 12/31/2011 8:46 AM Performed by: Carmela Rima Pre-anesthesia Checklist: Patient identified, Timeout performed, Emergency Drugs available, Suction available and Patient being monitored Patient Re-evaluated:Patient Re-evaluated prior to inductionOxygen Delivery Method: Circle system utilized Preoxygenation: Pre-oxygenation with 100% oxygen Intubation Type: IV induction Ventilation: Mask ventilation without difficulty Laryngoscope Size: Mac and 3 Grade View: Grade I Tube type: Oral Tube size: 7.5 mm Number of attempts: 1 Placement Confirmation: ETT inserted through vocal cords under direct vision,  breath sounds checked- equal and bilateral and positive ETCO2 Secured at: 22 cm Tube secured with: Tape Dental Injury: Teeth and Oropharynx as per pre-operative assessment

## 2011-12-31 NOTE — Interval H&P Note (Signed)
History and Physical Interval Note:  12/31/2011 8:22 AM  [name]  has presented today for surgery, with the diagnosis of thyroid goiter  The various methods of treatment have been discussed with the patient and family. After consideration of risks, benefits and other options for treatment, the patient has consented to  Procedure(s) (LRB): THYROIDECTOMY (Bilateral) as a surgical intervention .  The patient's history has been reviewed, patient examined, no change in status, stable for surgery.  I have reviewed the patient's chart and labs.  Questions were answered to the patient's satisfaction.     [name] Short

## 2011-12-31 NOTE — Anesthesia Preprocedure Evaluation (Addendum)
Anesthesia Evaluation  Patient identified by MRN, date of birth, ID band Patient awake    Reviewed: Allergy & Precautions, H&P , NPO status , Patient's Chart, lab work & pertinent test results, reviewed documented beta blocker date and time   History of Anesthesia Complications (+) PONV  Airway Mallampati: II      Dental  (+) Teeth Intact and Dental Advisory Given   Pulmonary          Cardiovascular + Valvular Problems/Murmurs AS  AVR   Neuro/Psych    GI/Hepatic negative GI ROS, Neg liver ROS,   Endo/Other    Renal/GU negative Renal ROS     Musculoskeletal   Abdominal   Peds  Hematology negative hematology ROS (+)   Anesthesia Other Findings   Reproductive/Obstetrics                         Anesthesia Physical Anesthesia Plan  ASA: II  Anesthesia Plan: General   Post-op Pain Management:    Induction: Intravenous  Airway Management Planned: Oral ETT  Additional Equipment:   Intra-op Plan:   Post-operative Plan: Extubation in OR  Informed Consent: I have reviewed the patients History and Physical, chart, labs and discussed the procedure including the risks, benefits and alternatives for the proposed anesthesia with the patient or authorized representative who has indicated his/her understanding and acceptance.   Dental advisory given  Plan Discussed with: CRNA and Surgeon  Anesthesia Plan Comments:        Anesthesia Quick Evaluation

## 2012-01-01 ENCOUNTER — Encounter (HOSPITAL_COMMUNITY): Payer: Self-pay | Admitting: Otolaryngology

## 2012-01-01 NOTE — Discharge Summary (Signed)
  Physician Discharge Summary  Patient ID: [name] Short MRN: [id number] DOB/AGE: 01-18-1971 41 y.o.  Admit date: 12/31/2011 Discharge date: 01/01/2012  Admission Diagnoses:Thyroid mass  Discharge Diagnoses:  Active Problems:  * No active hospital problems. *    Discharged Condition: good  Hospital Course: no complications, drain output too high to remove the drain.  Consults: None  Significant Diagnostic Studies: none  Treatments: surgery: Thyroid lobectomy/substernal goiter removal  Discharge Exam: Blood pressure 104/67, pulse 83, temperature 98 F (36.7 C), temperature source Oral, resp. rate 18, height 5\' 9"  (1.753 m), weight 181 lb 3.2 oz (82.192 kg), SpO2 100.00%. PHYSICAL EXAM: Incision excellent. Voice normal. JP out put serous but greater than 30 cc per shift.  Disposition:    Medication List  As of 01/01/2012 11:33 AM   TAKE these medications         HYDROcodone-acetaminophen 7.5-500 MG per tablet   Commonly known as: LORTAB   Take 1 tablet by mouth every 6 (six) hours as needed for pain.      promethazine 25 MG suppository   Commonly known as: PHENERGAN   Place 1 suppository (25 mg total) rectally every 6 (six) hours as needed for nausea.           Follow-up Information    Follow up with Serena Colonel, MD. Schedule an appointment as soon as possible for a visit in 1 day. (Call Jasmine December [phone] at 9 am)    Contact information:   [address], [address] [place] [address] [phone]          Signed: Serena Colonel 01/01/2012, 11:33 AM

## 2012-01-06 ENCOUNTER — Encounter: Payer: Self-pay | Admitting: Physician Assistant

## 2012-01-06 DIAGNOSIS — I719 Aortic aneurysm of unspecified site, without rupture: Secondary | ICD-10-CM

## 2012-01-06 DIAGNOSIS — E042 Nontoxic multinodular goiter: Secondary | ICD-10-CM | POA: Insufficient documentation

## 2012-01-06 DIAGNOSIS — D497 Neoplasm of unspecified behavior of endocrine glands and other parts of nervous system: Secondary | ICD-10-CM | POA: Insufficient documentation

## 2012-05-27 ENCOUNTER — Other Ambulatory Visit: Payer: Self-pay | Admitting: Obstetrics and Gynecology

## 2012-06-18 ENCOUNTER — Encounter: Payer: Self-pay | Admitting: *Deleted

## 2012-06-21 ENCOUNTER — Encounter: Payer: Self-pay | Admitting: Cardiology

## 2012-06-21 ENCOUNTER — Ambulatory Visit (INDEPENDENT_AMBULATORY_CARE_PROVIDER_SITE_OTHER): Payer: BC Managed Care – PPO | Admitting: Cardiology

## 2012-06-21 VITALS — BP 118/88 | HR 91 | Ht 67.0 in | Wt 195.0 lb

## 2012-06-21 DIAGNOSIS — Z954 Presence of other heart-valve replacement: Secondary | ICD-10-CM

## 2012-06-21 DIAGNOSIS — I359 Nonrheumatic aortic valve disorder, unspecified: Secondary | ICD-10-CM

## 2012-06-21 DIAGNOSIS — Z952 Presence of prosthetic heart valve: Secondary | ICD-10-CM

## 2012-06-21 DIAGNOSIS — I712 Thoracic aortic aneurysm, without rupture: Secondary | ICD-10-CM

## 2012-06-21 LAB — BASIC METABOLIC PANEL
CO2: 26 mEq/L (ref 19–32)
Calcium: 8.9 mg/dL (ref 8.4–10.5)
Creatinine, Ser: 0.8 mg/dL (ref 0.4–1.2)
GFR: 88.21 mL/min (ref >60.00)
Glucose, Bld: 103 mg/dL — ABNORMAL HIGH (ref 70–99)

## 2012-06-21 NOTE — Patient Instructions (Addendum)
Your physician recommends that you have lab work today--BMET.  Schedule an appointment for an MRA of your chest.   Your physician has requested that you have an echocardiogram. Echocardiography is a painless test that uses sound waves to create images of your heart. It provides your doctor with information about the size and shape of your heart and how well your heart's chambers and valves are working. This procedure takes approximately one hour. There are no restrictions for this procedure. March 2014  Your physician wants you to follow-up in: 1 year with Dr Shirlee Latch. (February 2015).  You will receive a reminder letter in the mail two months in advance. If you don't receive a letter, please call our office to schedule the follow-up appointment.

## 2012-06-22 DIAGNOSIS — I712 Thoracic aortic aneurysm, without rupture: Secondary | ICD-10-CM | POA: Insufficient documentation

## 2012-06-22 DIAGNOSIS — Z952 Presence of prosthetic heart valve: Secondary | ICD-10-CM | POA: Insufficient documentation

## 2012-06-22 NOTE — Progress Notes (Signed)
Patient ID: [name] Short, female   DOB: [birth year], 42 y.o.   MRN: [id number] Referring MD: Dr. Vincente Poli  42 yo with history of bicuspid aortic valve disorder developed severe aortic insufficiency in 2008.  She additionally was found to have an ascending aortic aneurysm.  She had AVR + supracoronary ascending aorta replacement in 2008.  She had a bioprosthetic aortic valve implanted since she wanted to have a child.  She used to see Dr. Elsie Lincoln and is seeing me for the first time today.    In general, she has been doing quite well lately.  She is staying at home to care for her 81-year-old.  She is reasonably active and exercises regularly.  She kick-boxes.  No exertional dyspnea, no chest pain. No palpitations or syncopal episodes.  She had an echo a year or so ago at [facility] but we do not yet have the records.  She does not think she has had aortic imaging for at least the last few years.    Labs (8/13): K 4.2, creatinine 0.75  ECG: NSR, normal  PMH: 1. H/o goiter with thyroid surgery in 8/13.  2. H/o uterine fibroids 3. Bicuspid aortic valve disorder: Developed severe aortic insufficiency.  She had AVR in 2008 with a 25 mm bioprosthetic aortic valve (bioprosthetic because she wanted to get pregnant).  She additionally had a supracoronary ascending aorta replacement for associated ascending aortic aneurysm.   4. LHC (2008, pre-surgery) with no coronary artery disease, EF 60% on LV-gram.   SH: Married, has 1 child.  She formerly worked in a preschool.  Nonsmoker.   FH: Father with bicuspid aortic valve, also s/p AVR.    ROS: All systems reviewed and negative except as per HPI.   No current outpatient prescriptions on file.   BP 118/88  Pulse 91  Ht 5\' 7"  (1.702 m)  Wt 195 lb (88.451 kg)  BMI 30.54 kg/m2 General: NAD Neck: No JVD, no thyromegaly or thyroid nodule.  Lungs: Clear to auscultation bilaterally with normal respiratory effort. CV: Nondisplaced PMI.  Heart regular  S1/S2, no S3/S4, 2/6 early SEM RUSB.  No peripheral edema.  No carotid bruit.  Normal pedal pulses.  Abdomen: Soft, nontender, no hepatosplenomegaly, no distention.  Skin: Intact without lesions or rashes.  Neurologic: Alert and oriented x 3.  Psych: Normal affect. Extremities: No clubbing or cyanosis.  HEENT: Normal.   Assessment/Plan:  42 yo with bicuspid aortic valve disorder.  She is status post bioprosthetic aortic valve replacement an supracoronary ascending aorta replacement.  She has been doing well symptomatically.  She will need periodic screening of her valve and of her thoracic aorta.  People with bicuspid aortic valve disorder often will get thoracic aneurysms (she already has had her ascending aorta replaced).  - I will get a baseline echo to evaluate her bioprosthetic aortic valve.  - MRA chest to assess the thoracic aorta for aneurysmal disease beyond the replaced segment of ascending aorta.   - She needs endocarditis prophylaxis with dental work.  - Will get records from [facility].  - Followup in 1 year if no problems and imaging studies look ok.   Marca Ancona 07/07/12 12:06 PM

## 2012-06-23 ENCOUNTER — Institutional Professional Consult (permissible substitution): Payer: BC Managed Care – PPO | Admitting: Cardiology

## 2012-06-28 ENCOUNTER — Telehealth: Payer: Self-pay | Admitting: Cardiology

## 2012-06-28 MED ORDER — DIAZEPAM 5 MG PO TABS: 5.0000 mg | ORAL_TABLET | Freq: Once | ORAL | Status: DC | PRN

## 2012-06-28 NOTE — Telephone Encounter (Signed)
PT  AWARE VALIUM CALLED IN AND THAT PT NEEDS SOMEONE TO DRIVE HER HOME AFTER TEST PT VERBALIZED UNDERSTAINDING./CY

## 2012-06-28 NOTE — Telephone Encounter (Signed)
Pt having an MRI tomorrow and was to get an rx for valium [facility] [address], pls call pt when done [phone]

## 2012-06-28 NOTE — Telephone Encounter (Signed)
Pt rtn call 

## 2012-06-28 NOTE — Telephone Encounter (Signed)
VALUIM 5 MG 1 TAB  CALLED INTO PHARMACY  WITH NO REFILLS  LEFT MESSAGE FOR PT TO CALL BACK  AWAITNG RETURN CALL ./CY

## 2012-06-29 ENCOUNTER — Ambulatory Visit (HOSPITAL_COMMUNITY)
Admission: RE | Admit: 2012-06-29 | Discharge: 2012-06-29 | Disposition: A | Discharge status: Home / Self Care | Payer: BC Managed Care – PPO | Source: Ambulatory Visit | Attending: Cardiology | Admitting: Cardiology

## 2012-06-29 DIAGNOSIS — Q231 Congenital insufficiency of aortic valve: Secondary | ICD-10-CM | POA: Insufficient documentation

## 2012-06-29 DIAGNOSIS — Z954 Presence of other heart-valve replacement: Secondary | ICD-10-CM | POA: Insufficient documentation

## 2012-06-29 DIAGNOSIS — Z952 Presence of prosthetic heart valve: Secondary | ICD-10-CM

## 2012-06-29 DIAGNOSIS — I712 Thoracic aortic aneurysm, without rupture, unspecified: Secondary | ICD-10-CM | POA: Insufficient documentation

## 2012-06-29 MED ORDER — GADOBENATE DIMEGLUMINE 529 MG/ML IV SOLN
20.0000 mL | Freq: Once | INTRAVENOUS | Status: AC
  Administered 2012-06-29: 18 mL via INTRAVENOUS

## 2012-07-20 ENCOUNTER — Other Ambulatory Visit (HOSPITAL_COMMUNITY): Payer: BC Managed Care – PPO

## 2012-07-28 ENCOUNTER — Other Ambulatory Visit (HOSPITAL_COMMUNITY): Payer: BC Managed Care – PPO

## 2012-08-10 ENCOUNTER — Ambulatory Visit (HOSPITAL_COMMUNITY): Payer: BC Managed Care – PPO | Attending: Cardiology | Admitting: Radiology

## 2012-08-10 DIAGNOSIS — I359 Nonrheumatic aortic valve disorder, unspecified: Secondary | ICD-10-CM

## 2012-08-10 DIAGNOSIS — I712 Thoracic aortic aneurysm, without rupture, unspecified: Secondary | ICD-10-CM | POA: Insufficient documentation

## 2012-08-10 DIAGNOSIS — Z954 Presence of other heart-valve replacement: Secondary | ICD-10-CM | POA: Insufficient documentation

## 2012-08-10 DIAGNOSIS — Z952 Presence of prosthetic heart valve: Secondary | ICD-10-CM

## 2012-08-10 NOTE — Progress Notes (Signed)
Echocardiogram performed.  

## 2012-11-09 ENCOUNTER — Ambulatory Visit (INDEPENDENT_AMBULATORY_CARE_PROVIDER_SITE_OTHER): Payer: BC Managed Care – PPO | Admitting: Internal Medicine

## 2012-11-09 ENCOUNTER — Ambulatory Visit: Payer: BC Managed Care – PPO

## 2012-11-09 VITALS — BP 122/80 | HR 81 | Temp 98.0°F | Resp 18 | Ht 67.0 in | Wt 197.0 lb

## 2012-11-09 DIAGNOSIS — S9030XA Contusion of unspecified foot, initial encounter: Secondary | ICD-10-CM

## 2012-11-09 DIAGNOSIS — M25572 Pain in left ankle and joints of left foot: Secondary | ICD-10-CM

## 2012-11-09 DIAGNOSIS — M25579 Pain in unspecified ankle and joints of unspecified foot: Secondary | ICD-10-CM

## 2012-11-09 NOTE — Progress Notes (Signed)
  Subjective:    Patient ID: [name] Short, female    DOB: 18-Apr-1971, 42 y.o.   MRN: [id number]  HPI Kick boxing and injured dorsal mid foot on punching bag. Also heel both feet have been aching, mildly. Able to walk without limp. Has dorsal swelling and pain left.   Review of Systems Aortic tissue valve surgery 2008    Objective:   Physical Exam  Constitutional: She appears well-developed and well-nourished. No distress.  Eyes: EOM are normal.  Cardiovascular: Normal rate.   Pulmonary/Chest: Effort normal.  Musculoskeletal:       Feet:  Tender, eccymosis,swelling  Neurological: No sensory deficit. Coordination and gait normal.   [facility] reading (PRIMARY) by  Dr Perrin Maltese no fx seen         Assessment & Plan:  RICE/motrin/camwalker RTC if pain persists/consider scan

## 2013-03-24 ENCOUNTER — Other Ambulatory Visit: Payer: Self-pay

## 2013-06-02 ENCOUNTER — Telehealth: Payer: Self-pay | Admitting: Cardiology

## 2013-06-02 NOTE — Telephone Encounter (Signed)
Would try to keep weights to < 50 lbs.

## 2013-06-02 NOTE — Telephone Encounter (Signed)
New message   Starting a new exercise programs has some questions.

## 2013-06-02 NOTE — Telephone Encounter (Signed)
LMTCB

## 2013-06-02 NOTE — Telephone Encounter (Signed)
Spoke with patient about  exercise program at a new gym. Pt asking if she should not lift weights at all, including hand weights, kettle ball, or free weights or if she can lift up to a certain weight limit.  She is doing interval training and kick boxing, pretty standard exercises. She would also like to know if she should avoid any exercises. I will forward to Dr Aundra Dubin for review.

## 2013-06-02 NOTE — Telephone Encounter (Signed)
Patient returning your call. Please call back.

## 2013-06-06 NOTE — Telephone Encounter (Signed)
LMTCB

## 2013-06-06 NOTE — Telephone Encounter (Signed)
Follow up ° ° ° ° °Returned a nurses call °

## 2013-06-06 NOTE — Telephone Encounter (Signed)
Pt.notified

## 2013-06-10 ENCOUNTER — Other Ambulatory Visit: Payer: Self-pay | Admitting: Obstetrics and Gynecology

## 2013-11-21 ENCOUNTER — Encounter: Payer: Self-pay | Admitting: Physician Assistant

## 2013-11-21 ENCOUNTER — Ambulatory Visit (INDEPENDENT_AMBULATORY_CARE_PROVIDER_SITE_OTHER): Payer: BC Managed Care – PPO | Admitting: Physician Assistant

## 2013-11-21 VITALS — BP 116/78 | HR 75 | Ht 67.0 in | Wt 173.0 lb

## 2013-11-21 DIAGNOSIS — I712 Thoracic aortic aneurysm, without rupture, unspecified: Secondary | ICD-10-CM

## 2013-11-21 DIAGNOSIS — Z952 Presence of prosthetic heart valve: Secondary | ICD-10-CM

## 2013-11-21 DIAGNOSIS — R079 Chest pain, unspecified: Secondary | ICD-10-CM

## 2013-11-21 DIAGNOSIS — Z954 Presence of other heart-valve replacement: Secondary | ICD-10-CM

## 2013-11-21 DIAGNOSIS — I7121 Aneurysm of the ascending aorta, without rupture: Secondary | ICD-10-CM

## 2013-11-21 NOTE — Patient Instructions (Signed)
Your physician recommends that you continue on your current medications as directed. Please refer to the Current Medication list given to you today.  Your physician recommends that you schedule a follow-up appointment in: [place]  Your physician has requested that you have an echocardiogram. Echocardiography is a painless test that uses sound waves to create images of your heart. It provides your doctor with information about the size and shape of your heart and how well your heart's chambers and valves are working. This procedure takes approximately one hour. There are no restrictions for this procedure.  Your physician has requested that you have a stress echocardiogram. For further information please visit [email or web address]. Please follow instruction sheet as given.

## 2013-11-21 NOTE — Progress Notes (Signed)
Cardiology Office Note    Date:  11/21/2013   ID:  [name] Short, DOB 06-15-1970, MRN [id number]  PCP:  Pcp Not In System  Cardiologist:  Dr. Loralie Champagne      History of Present Illness: [name] Short is a 43 y.o. female with a hx of bicuspid aortic valve disorder who developed severe aortic insufficiency in 2008. She additionally was found to have an ascending aortic aneurysm. She had AVR + supracoronary ascending aorta replacement in 2008. She had a bioprosthetic aortic valve implanted since she wanted to have a child. She used to see Dr. Melvern Banker of [facility] and established with Dr. Loralie Champagne in 06/2012.  Echo and MRA done at that time with stable findings.    She returns for follow up.  Over the last several mos, she has noted L sided dull chest pain. This can occur at any time.  It is not necessarily associated with exertion.  She denies associated dyspnea, nausea or diaphoresis.  She works out 3-4 times a week.  Denies any significant chest pain with exercise.  She denies orthopnea, PND, edema.  Denies syncope.  While she has discomfort, she notes it more with deep inspiration.     Studies:  - Echo (07/2012):  EF 60-65%, AVR ok with mild AS (mean 20 mmHg), ascending aorta mildly dilated.  - Chest MRA (06/2012):  Bioprosthetic aortic valve appears to open normally.  Proximal ascending aorta has been replaced with a graft. No apparent surgical site complications.  The distal ascending aorta (native vessel) is dilated to 4.0 cm.   Recent Labs: No results found for requested labs within last 365 days.   Wt Readings from Last 3 Encounters:  11/21/13 173 lb (78.472 kg)  11/09/12 197 lb (89.359 kg)  06/21/12 195 lb (88.451 kg)     Past Medical History  Diagnosis Date  . Congenital heart defect     bicuspid aortic valve, aortic insufficeincy, ascending aortic aneurysm => s/p AVR (bioprosthetic) and ascending aortic aneurysm repair in 2008  . Multiple thyroid nodules     H/o goiter  with thyroid surgery in 8/13  . Breast feeding status of mother   . History of uterine fibroid   . Hx of cardiac cath     [facility] (2008, pre-surgery) with no coronary artery disease, EF 60% on LV-gram.    Current Outpatient Prescriptions  Medication Sig Dispense Refill  . levothyroxine (SYNTHROID, LEVOTHROID) 125 MCG tablet Take 125 mcg by mouth daily before breakfast.       No current facility-administered medications for this visit.    Allergies:   Amoxicillin and Bactroban   Social History:  The patient  reports that she has never smoked. She does not have any smokeless tobacco history on file. She reports that she drinks alcohol. She reports that she does not use illicit drugs.   Family History:  The patient's family history includes Cancer in her maternal grandmother and mother; Goiter in her sister; Heart disease in her maternal grandfather; Hypertension in her mother; Hypothyroidism in her sister; Prostate cancer in her father.   ROS:  Please see the history of present illness.   No CP related to meals.  No recent trips, surgery, leg injury.  No bleeding.  Lost 22 lbs with dieting.    All other systems reviewed and negative.   PHYSICAL EXAM: VS:  BP 116/78  Pulse 75  Ht 5\' 7"  (1.702 m)  Wt 173 lb (78.472 kg)  BMI  27.09 kg/m2 Well nourished, well developed, in no acute distress HEENT: normal Neck: no JVD Cardiac:  normal S1, S2; RRR; 2/6 systolic murmurat RUSB Lungs:  clear to auscultation bilaterally, no wheezing, rhonchi or rales Abd: soft, nontender, no hepatomegaly Ext: no edema Skin: warm and dry Neuro:  CNs 2-12 intact, no focal abnormalities noted  EKG:  NSR, HR 75, normal axis, no acute changes     ASSESSMENT AND PLAN:  1. Chest Pain:  Symptoms somewhat atypical.  It has been over a year since her last echo.  I will arrange a follow up echo to assess her aortic valve and ascending aorta and an ETT-Echo to rule out ischemia.  I will review with Dr. Loralie Champagne to  see if f/u MRA is needed at this time. 2. Ascending aortic aneurysm:  Repeat Echo as noted. 3. H/O aortic valve replacement:  Repeat echo as noted. 4. Disposition:  F/u with Dr. Loralie Champagne in 3 mos or sooner if needed.     Signed, Versie Starks, MHS 11/21/2013 11:01 AM    [facility] [address], [place], [place]  [address] Phone: [phone]; Fax: [phone]

## 2013-12-09 ENCOUNTER — Ambulatory Visit (HOSPITAL_COMMUNITY): Payer: BC Managed Care – PPO | Attending: Internal Medicine

## 2013-12-09 ENCOUNTER — Ambulatory Visit (HOSPITAL_BASED_OUTPATIENT_CLINIC_OR_DEPARTMENT_OTHER): Payer: BC Managed Care – PPO | Admitting: Cardiology

## 2013-12-09 DIAGNOSIS — Z952 Presence of prosthetic heart valve: Secondary | ICD-10-CM

## 2013-12-09 DIAGNOSIS — R079 Chest pain, unspecified: Secondary | ICD-10-CM | POA: Insufficient documentation

## 2013-12-09 DIAGNOSIS — I7121 Aneurysm of the ascending aorta, without rupture: Secondary | ICD-10-CM

## 2013-12-09 DIAGNOSIS — I712 Thoracic aortic aneurysm, without rupture, unspecified: Secondary | ICD-10-CM | POA: Insufficient documentation

## 2013-12-09 DIAGNOSIS — R0602 Shortness of breath: Secondary | ICD-10-CM

## 2013-12-09 DIAGNOSIS — Z954 Presence of other heart-valve replacement: Secondary | ICD-10-CM | POA: Insufficient documentation

## 2013-12-09 NOTE — Progress Notes (Signed)
Echo performed. 

## 2013-12-09 NOTE — Progress Notes (Signed)
Stress echo performed.

## 2013-12-11 ENCOUNTER — Encounter: Payer: Self-pay | Admitting: Physician Assistant

## 2014-01-06 IMAGING — US US SOFT TISSUE HEAD/NECK
1 series · 13 of 25 positions shown · non-contrast
Comparison: Ultrasound of the thyroid of 02/12/2009

CLINICAL DATA: Thyroid goiter, follow-up nodules

THYROID ULTRASOUND
TECHNIQUE: Ultrasound examination of the thyroid gland and adjacent
soft tissues was performed.

[Series 1: us soft tissue head/neck · 0.10mm/px · 13 of 82 slices shown]
[im 1/82]
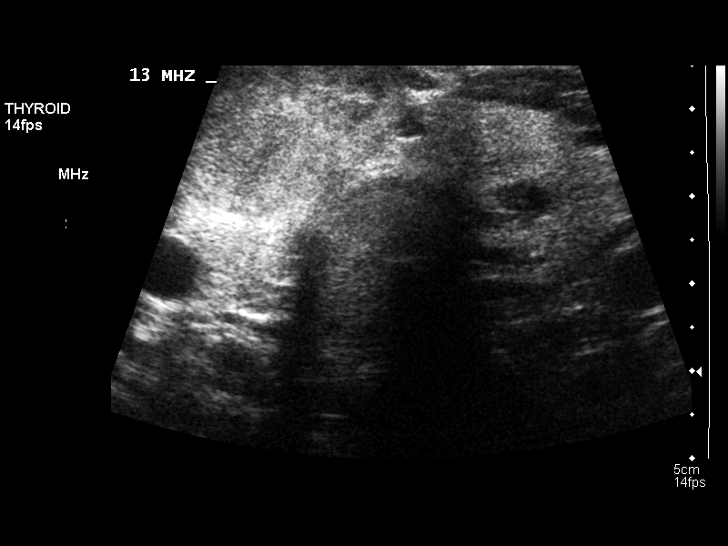
[im 7/82]
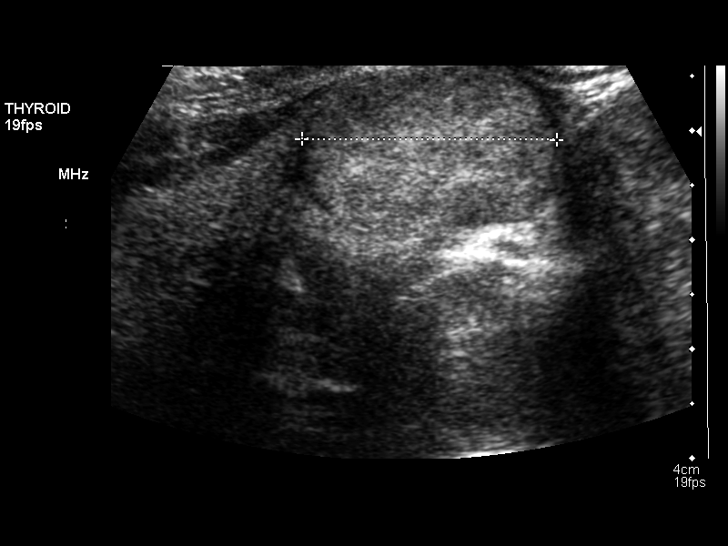
[im 14/82]
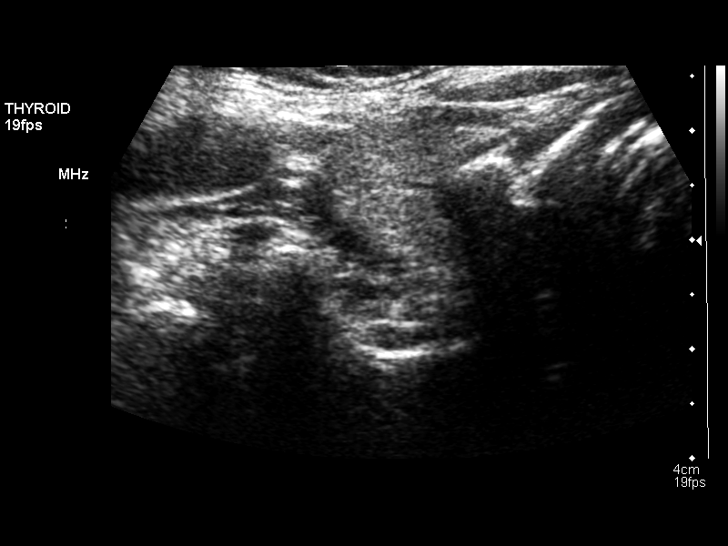
[im 21/82]
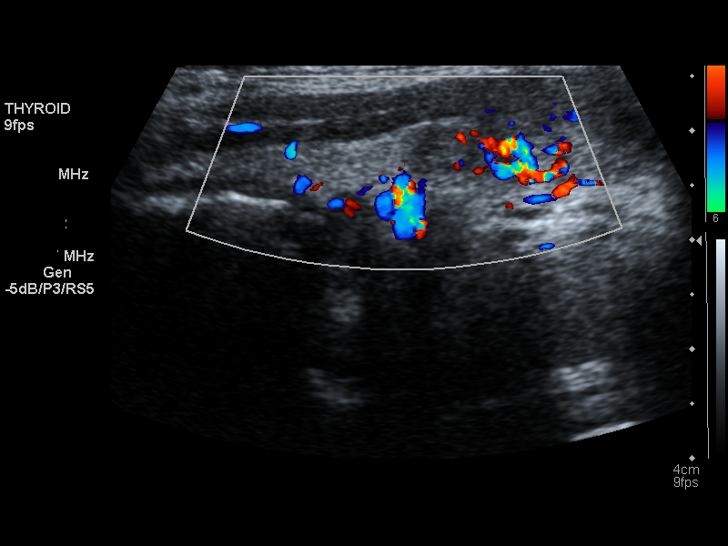
[im 28/82]
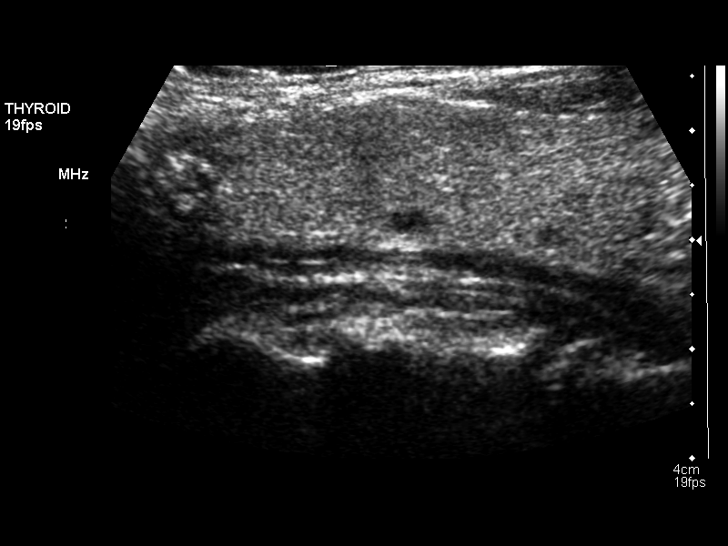
[im 34/82]
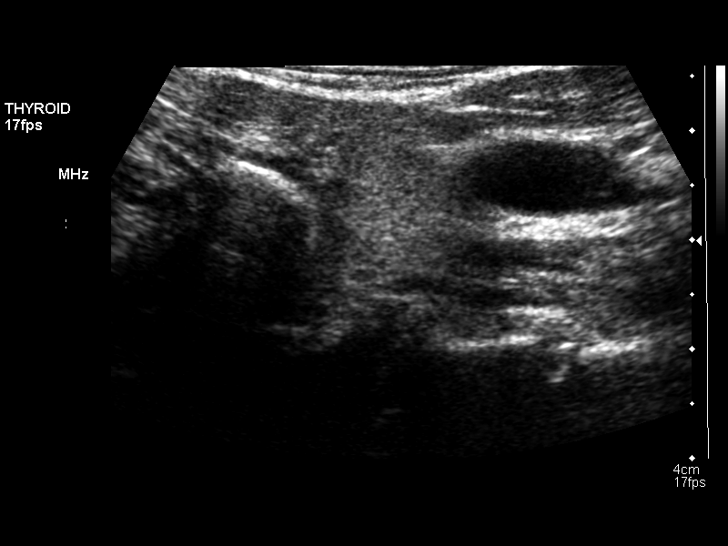
[im 41/82]
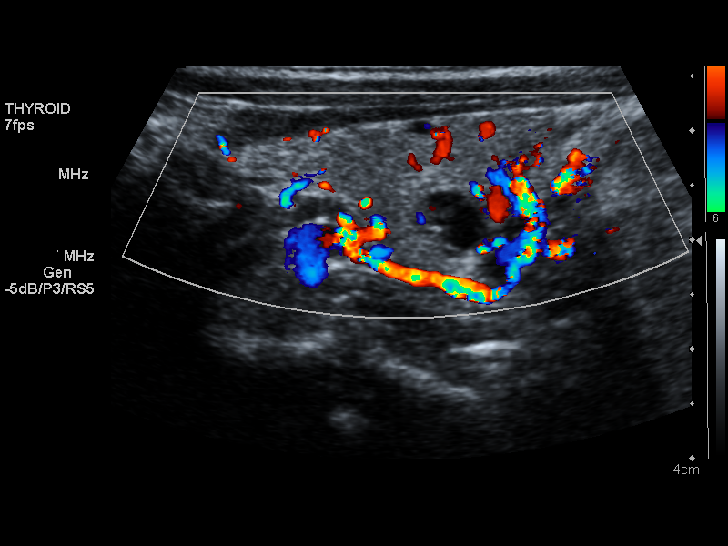
[im 48/82]
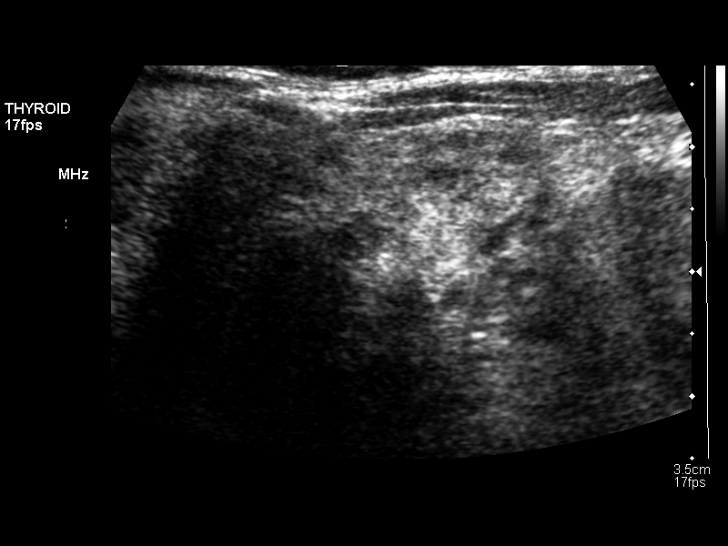
[im 55/82]
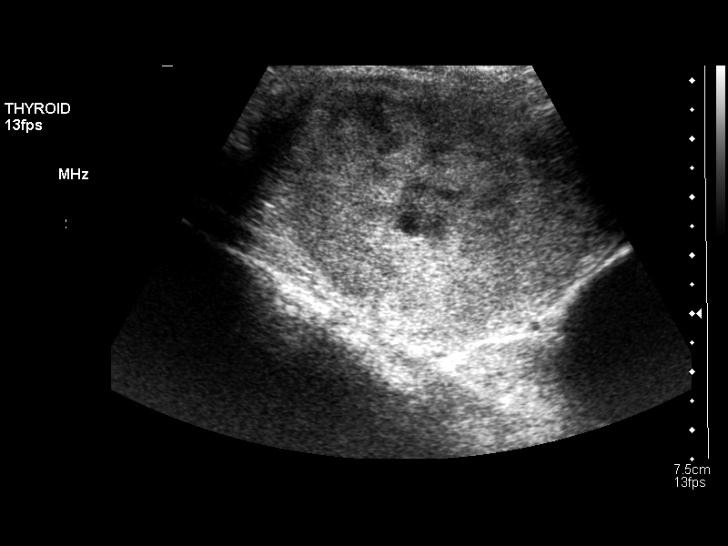
[im 61/82]
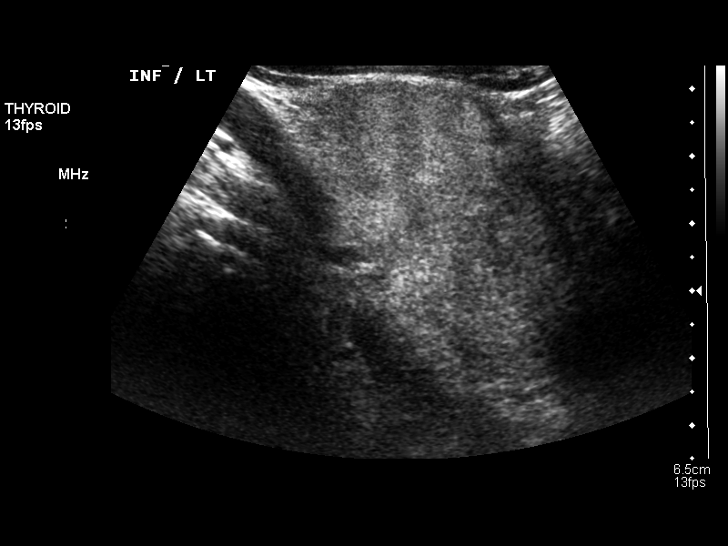
[im 68/82]
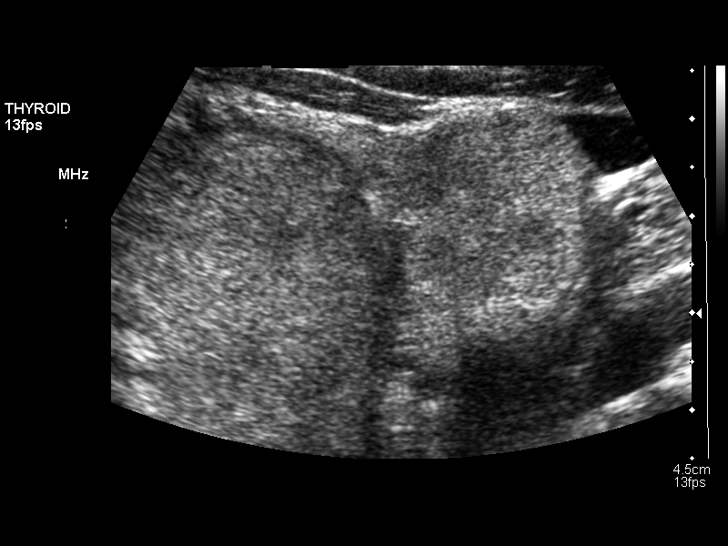
[im 75/82]
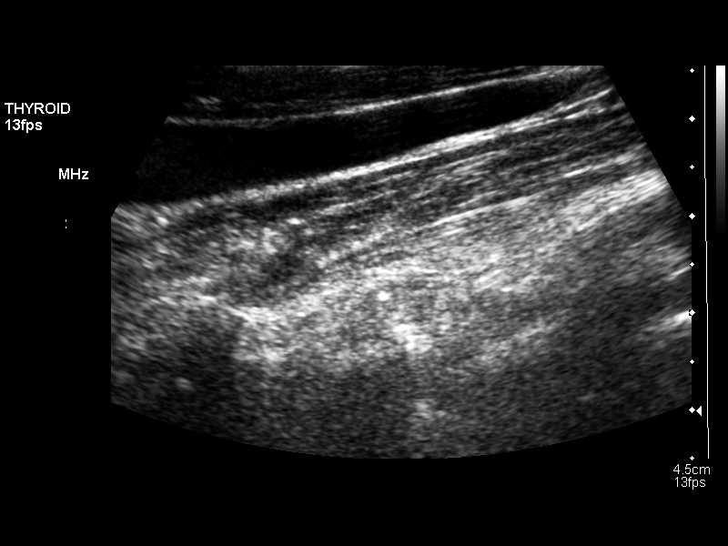
[im 82/82]
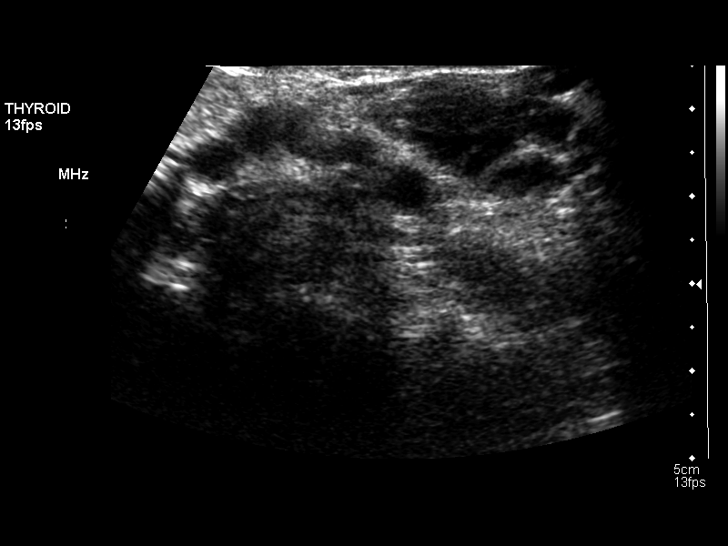

[13 of 25 positions shown; findings below may reference images not displayed]

FINDINGS: Right thyroid lobe:  5.5 x 1.7 x 2.0 cm.  (Previously 5.9 x 1.4 x
2.3 cm).
Left thyroid lobe:  5.5 x 1.6 x 2.6 cm.  (Previously 4.4 x 1.2 x
2.0 cm).
Isthmus:  8 mm in thickness.

Focal nodules:  The echogenicity of the thyroid gland is
inhomogeneous.  There are multiple nodules bilaterally.  The
largest nodule emanates from the isthmus inferiorly measuring 5.8 x
4.1 x 5.7 cm compared to prior measurements of 4.3 x 3.4 x 4.8 cm.
An additional solid nodule emanates from the inferior aspect of the
inferior isthmic nodule to the left of midline measuring 2.4 x
x 4.7 cm compared to prior measurements of 2.4 x 3.1 x 4.1 cm.  A
right isthmic solid nodule measures 2.7 x 1.7 x 2.3 cm compared to
prior measurements of 1.8 x 1.2 x 1.7 cm.  Additional nodules are
present bilaterally of no more than 1.1 cm in diameter.

Lymphadenopathy:  None visualized.
IMPRESSION: Some increase in size of the dominant solid nodules when compared
to prior study.  This pattern is still most consistent with
multinodular goiter.  In addition, the dominant nodules were
biopsied in February 2009.

## 2014-03-16 ENCOUNTER — Ambulatory Visit: Payer: BC Managed Care – PPO | Admitting: Cardiology

## 2014-03-21 ENCOUNTER — Encounter: Payer: Self-pay | Admitting: *Deleted

## 2014-03-23 ENCOUNTER — Encounter: Payer: Self-pay | Admitting: Cardiology

## 2014-03-23 ENCOUNTER — Ambulatory Visit (INDEPENDENT_AMBULATORY_CARE_PROVIDER_SITE_OTHER): Payer: BC Managed Care – PPO | Admitting: Cardiology

## 2014-03-23 VITALS — BP 118/76 | HR 75 | Ht 67.0 in | Wt 177.0 lb

## 2014-03-23 DIAGNOSIS — Z954 Presence of other heart-valve replacement: Secondary | ICD-10-CM

## 2014-03-23 DIAGNOSIS — I7121 Aneurysm of the ascending aorta, without rupture: Secondary | ICD-10-CM

## 2014-03-23 DIAGNOSIS — Z952 Presence of prosthetic heart valve: Secondary | ICD-10-CM

## 2014-03-23 DIAGNOSIS — I712 Thoracic aortic aneurysm, without rupture: Secondary | ICD-10-CM

## 2014-03-23 NOTE — Patient Instructions (Signed)
Your physician recommends that you continue on your current medications as directed. Please refer to the Current Medication list given to you today. Your physician recommends that you return for a FASTING lipid profile. Please schedule an MR angiogram/chest for next July 2016.

## 2014-03-25 NOTE — Progress Notes (Signed)
Patient ID: [name] Short, female   DOB: 02/19/1971, 43 y.o.   MRN: [id number] Referring MD: Dr. Helane Rima  43 yo with history of bicuspid aortic valve disorder developed severe aortic insufficiency in 2008.  She additionally was found to have an ascending aortic aneurysm.  She had AVR + supracoronary ascending aorta replacement in 2008.  She had a bioprosthetic aortic valve implanted since she wanted to have a child.  Last echo in 7/15 showed normal EF with stable bioprosthetic aortic valve.  MRA chest in 2/14 showed stable repaired proximal ascending aorta with 4.0 cm distal ascending aorta.  Stress echo in 7/15 was normal.   In general, she has been doing quite well lately.  She is reasonably active and exercises regularly.  She kick-boxes.  No exertional dyspnea, no further chest pain. No palpitations or syncopal episodes.   Labs (8/13): K 4.2, creatinine 0.75  PMH: 1. H/o goiter with thyroid surgery in 8/13.  2. H/o uterine fibroids 3. Bicuspid aortic valve disorder: Developed severe aortic insufficiency.  She had AVR in 2008 with a 25 mm bioprosthetic aortic valve (bioprosthetic because she wanted to get pregnant).  She additionally had a supracoronary ascending aorta replacement for associated ascending aortic aneurysm.  MRA chest (2/14) proximal ascending aorta with stable repair, distal ascending aorta measured 4.0 cm).  Echo (7/15) with EF 55-60%, bioprosthetic aortic valve with mean gradient 16 mmHg, no significant AI, distal ascending aorta 4.0 cm.   4. LHC (2008, pre-surgery) with no coronary artery disease, EF 60% on LV-gram.  Stress echo 7/15 normal.   SH: Married, has 1 child.  She formerly worked in a preschool.  Nonsmoker.   FH: Father with bicuspid aortic valve, also s/p AVR.    ROS: All systems reviewed and negative except as per HPI.   Current Outpatient Prescriptions  Medication Sig Dispense Refill  . levothyroxine (SYNTHROID, LEVOTHROID) 125 MCG tablet Take 125 mcg by mouth  daily before breakfast.     No current facility-administered medications for this visit.   BP 118/76 mmHg  Pulse 75  Ht 5\' 7"  (1.702 m)  Wt 177 lb (80.287 kg)  BMI 27.72 kg/m2  SpO2 99% General: NAD Neck: No JVD, no thyromegaly or thyroid nodule.  Lungs: Clear to auscultation bilaterally with normal respiratory effort. CV: Nondisplaced PMI.  Heart regular S1/S2, no S3/S4, 1/6 early SEM RUSB.  No peripheral edema.  No carotid bruit.  Normal pedal pulses.  Abdomen: Soft, nontender, no hepatosplenomegaly, no distention.  Skin: Intact without lesions or rashes.  Neurologic: Alert and oriented x 3.  Psych: Normal affect. Extremities: No clubbing or cyanosis.  HEENT: Normal.   Assessment/Plan:  43 yo with bicuspid aortic valve disorder.  She is status post bioprosthetic aortic valve replacement an supracoronary ascending aorta replacement.  She has been doing well symptomatically.  She will need periodic screening of her valve and of her thoracic aorta.  People with bicuspid aortic valve disorder often will get thoracic aneurysms (she already has had her ascending aorta replaced).  - In in 7/15 showed stable valve and 4 cm ascending aorta beyond the repair.  - Stable repaired ascending aorta with 4 cm distal ascending aorta on 2/14 MRA.    - She needs endocarditis prophylaxis with dental work.  - She will need MRA chest in 7/16 to follow aorta.  - Will check lipids today.   Loralie Champagne 03/25/2014 10:41 AM

## 2014-03-29 ENCOUNTER — Other Ambulatory Visit (INDEPENDENT_AMBULATORY_CARE_PROVIDER_SITE_OTHER): Payer: BC Managed Care – PPO | Admitting: *Deleted

## 2014-03-29 DIAGNOSIS — I7121 Aneurysm of the ascending aorta, without rupture: Secondary | ICD-10-CM

## 2014-03-29 DIAGNOSIS — I712 Thoracic aortic aneurysm, without rupture: Secondary | ICD-10-CM

## 2014-03-29 LAB — LIPID PANEL
CHOLESTEROL: 207 mg/dL — AB (ref 0–200)
HDL: 43 mg/dL (ref >39.00)
LDL Cholesterol: 136 mg/dL — ABNORMAL HIGH (ref 0–99)
NonHDL: 164
TRIGLYCERIDES: 142 mg/dL (ref 0.0–149.0)
Total CHOL/HDL Ratio: 5
VLDL: 28.4 mg/dL (ref 0.0–40.0)

## 2014-04-08 ENCOUNTER — Telehealth: Payer: Self-pay

## 2014-04-08 ENCOUNTER — Ambulatory Visit (INDEPENDENT_AMBULATORY_CARE_PROVIDER_SITE_OTHER): Payer: BC Managed Care – PPO | Admitting: Family Medicine

## 2014-04-08 ENCOUNTER — Encounter: Payer: Self-pay | Admitting: Family Medicine

## 2014-04-08 VITALS — BP 120/80 | HR 86 | Temp 98.1°F | Resp 16 | Ht 67.0 in | Wt 175.0 lb

## 2014-04-08 DIAGNOSIS — IMO0002 Reserved for concepts with insufficient information to code with codable children: Secondary | ICD-10-CM

## 2014-04-08 DIAGNOSIS — Z952 Presence of prosthetic heart valve: Secondary | ICD-10-CM

## 2014-04-08 DIAGNOSIS — Z23 Encounter for immunization: Secondary | ICD-10-CM

## 2014-04-08 DIAGNOSIS — Z954 Presence of other heart-valve replacement: Secondary | ICD-10-CM

## 2014-04-08 DIAGNOSIS — T300 Burn of unspecified body region, unspecified degree: Secondary | ICD-10-CM

## 2014-04-08 MED ORDER — SILVER SULFADIAZINE 1 % EX CREA: 1.0000 "application " | TOPICAL_CREAM | Freq: Every day | CUTANEOUS | Status: DC

## 2014-04-08 MED ORDER — TETANUS-DIPHTH-ACELL PERTUSSIS 5-2.5-18.5 LF-MCG/0.5 IM SUSP
0.5000 mL | Freq: Once | INTRAMUSCULAR | Status: AC
  Administered 2014-04-08: 0.5 mL via INTRAMUSCULAR
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
## 2014-04-08 NOTE — Patient Instructions (Signed)
Burn Care Your skin is a natural barrier to infection. It is the largest organ of your body. Burns damage this natural protection. To help prevent infection, it is very important to follow your caregiver's instructions in the care of your burn. Burns are classified as:  First degree. There is only redness of the skin (erythema). No scarring is expected.  Second degree. There is blistering of the skin. Scarring may occur with deeper burns.  Third degree. All layers of the skin are injured, and scarring is expected. HOME CARE INSTRUCTIONS   Wash your hands well before changing your bandage.  Change your bandage as often as directed by your caregiver.  Remove the old bandage. If the bandage sticks, you may soak it off with cool, clean water.  Cleanse the burn thoroughly but gently with mild soap and water.  Pat the area dry with a clean, dry cloth.  Apply a thin layer of antibacterial cream to the burn.  Apply a clean bandage as instructed by your caregiver.  Keep the bandage as clean and dry as possible.  Elevate the affected area for the first 24 hours, then as instructed by your caregiver.  Only take over-the-counter or prescription medicines for pain, discomfort, or fever as directed by your caregiver. SEEK IMMEDIATE MEDICAL CARE IF:   You develop excessive pain.  You develop redness, tenderness, swelling, or red streaks near the burn.  The burned area develops yellowish-white fluid (pus) or a bad smell.  You have a fever. MAKE SURE YOU:   Understand these instructions.  Will watch your condition.  Will get help right away if you are not doing well or get worse. Document Released: 05/05/2005 Document Revised: 07/28/2011 Document Reviewed: 09/25/2010 ExitCare Patient Information 2015 [employer]. This information is not intended to replace advice given to you by your health care provider. Make sure you discuss any questions you have with your health care  provider.  

## 2014-04-08 NOTE — Telephone Encounter (Signed)
[facility] called to ask if we could send this pt's Silvadene cream to them because the [facility] on [address] is closed today. Done
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
## 2014-04-08 NOTE — Addendum Note (Signed)
Addended by: Constance Goltz on: 04/08/2014 02:37 PM   Modules accepted: Orders
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
## 2014-04-09 NOTE — Telephone Encounter (Signed)
Spoke with pt. She was worried about getting an infection and if she should be on oral abx. Reassured her that the Silvadene cream has an abx in it, but gave her precautions on when to RTC.

## 2014-06-23 ENCOUNTER — Other Ambulatory Visit: Payer: Self-pay | Admitting: Obstetrics and Gynecology

## 2014-06-26 LAB — CYTOLOGY - PAP

## 2014-11-23 ENCOUNTER — Ambulatory Visit (HOSPITAL_COMMUNITY): Payer: BLUE CROSS/BLUE SHIELD

## 2014-11-28 ENCOUNTER — Telehealth: Payer: Self-pay | Admitting: *Deleted

## 2014-11-28 MED ORDER — DIAZEPAM 5 MG PO TABS: ORAL_TABLET | ORAL | Status: DC

## 2014-11-28 NOTE — Telephone Encounter (Signed)
LMTCB

## 2014-11-28 NOTE — Telephone Encounter (Signed)
Valium 2.5mg  not available, Valium 2 mg or Valium 5mg  available, Epic indicates Valium 5mg  was prescribed prior to MRI in 2015. I will forward to Dr Aundra Dubin for review.

## 2014-11-28 NOTE — Telephone Encounter (Signed)
Valium 2.5 mg po x 1 30 minutes prior to MRI

## 2014-11-28 NOTE — Telephone Encounter (Signed)
Valium 5 mg ok

## 2014-11-28 NOTE — Addendum Note (Signed)
Addended by: Katrine Coho on: 11/28/2014 03:38 PM   Modules accepted: Orders

## 2014-11-28 NOTE — Telephone Encounter (Signed)
Pt advised.

## 2014-11-28 NOTE — Telephone Encounter (Signed)
Patient called and stated that Dr Aundra Dubin was going to give her an rx for valium to take prior to her MRI. Please advise. Thanks, MI

## 2014-11-28 NOTE — Addendum Note (Signed)
Addended by: Katrine Coho on: 11/28/2014 03:54 PM   Modules accepted: Orders

## 2014-12-01 ENCOUNTER — Ambulatory Visit (HOSPITAL_COMMUNITY): Admission: RE | Admit: 2014-12-01 | Payer: BLUE CROSS/BLUE SHIELD | Source: Ambulatory Visit

## 2014-12-01 ENCOUNTER — Other Ambulatory Visit (HOSPITAL_COMMUNITY): Payer: BLUE CROSS/BLUE SHIELD

## 2014-12-18 ENCOUNTER — Telehealth: Payer: Self-pay | Admitting: Cardiology

## 2014-12-18 NOTE — Telephone Encounter (Signed)
LMTCB for pt 

## 2014-12-18 NOTE — Telephone Encounter (Signed)
New Message      Pt calling wanting to know if she can get MRI that Dr. Aundra Dubin ordered done at a doctors office instead of at the hospital due to insurance reasons. Please call back and advise.

## 2014-12-18 NOTE — Telephone Encounter (Signed)
Pt states her insurance company told her that if she had her chest MRA scheduled at a doctor's office the cost to her would be less than at hospital facility.  Pt states she was told by her insurance company  her cost would be $30 co-pay at doctors office vs a percentage of the total cost of the test, she states was $1000 last time she had the test done.

## 2014-12-18 NOTE — Telephone Encounter (Signed)
Don't think there are any doctors offices that will do MRA.  Would CTA chest be less expensive?

## 2014-12-18 NOTE — Telephone Encounter (Signed)
yes

## 2014-12-18 NOTE — Telephone Encounter (Signed)
Dr Colman Cater a chest CTA is less expensive do you want to that in the place of a chest MRA?

## 2014-12-18 NOTE — Telephone Encounter (Signed)
Per our prior authorization department, they are not aware of a doctor's office or outpatient facility that would not be subject to hospital fees for pt to have chest MRA. Pt advised I will forward to Dr Aundra Dubin for review to see if he is aware of options for patient.

## 2014-12-19 NOTE — Telephone Encounter (Signed)
Follow up      Returning a call to the nurse regarding MRA question

## 2014-12-19 NOTE — Telephone Encounter (Signed)
LMTCB.  Will route to [employer]

## 2014-12-20 NOTE — Telephone Encounter (Signed)
Pt given Dr Claris Gladden recommendations regarding scheduling chest MRA vs chest CTA. Pt is going to get some information from her insurance company, pt states she will call our office when schedule testing,either chest MRA or chest CTA.

## 2015-01-04 ENCOUNTER — Telehealth: Payer: Self-pay | Admitting: Cardiology

## 2015-01-04 DIAGNOSIS — I7121 Aneurysm of the ascending aorta, without rupture: Secondary | ICD-10-CM

## 2015-01-04 DIAGNOSIS — I712 Thoracic aortic aneurysm, without rupture: Secondary | ICD-10-CM

## 2015-01-04 DIAGNOSIS — I77810 Thoracic aortic ectasia: Secondary | ICD-10-CM

## 2015-01-04 NOTE — Telephone Encounter (Signed)
New Message  Pt calling to speak /w RN concerning scheduling Chest CT or MRA. Please call back and discuss.

## 2015-01-04 NOTE — Telephone Encounter (Signed)
Notified patient that she has been talking to Desiree Lucy, RN about this issue and that Webb Silversmith will be back in the office tomorrow.  She verbalized understanding and thanked me for the call.

## 2015-01-05 NOTE — Telephone Encounter (Signed)
LMTCB

## 2015-01-09 NOTE — Telephone Encounter (Signed)
I would be a little concerned about the quality of the study and the interpretation at that location.  What about the [facility] outpatient facilities?

## 2015-01-09 NOTE — Telephone Encounter (Signed)
I will forward to Dr Aundra Dubin for review and Ok to schedule at this facility.

## 2015-01-09 NOTE — Telephone Encounter (Signed)
Pt states she will check prices at [facility] and let me know her decision about scheduling chest CTA.

## 2015-01-09 NOTE — Telephone Encounter (Signed)
Pt is requesting chest CTA to be scheduled at [facility] in [place], the cost will be less expensive ($350) than other facilities.

## 2015-01-10 NOTE — Telephone Encounter (Signed)
F/u  Pt called to let [name] Short know- imaging offices accepted by her insurance:  [facility]

## 2015-01-12 NOTE — Telephone Encounter (Signed)
Pt requesting to schedule chest CTA  with and without contrast at [place], pt advised I will forward to scheduler to contact pt to schedule.

## 2015-01-12 NOTE — Telephone Encounter (Signed)
LMTCB

## 2015-01-18 ENCOUNTER — Other Ambulatory Visit: Payer: BLUE CROSS/BLUE SHIELD

## 2015-01-30 ENCOUNTER — Ambulatory Visit
Admission: RE | Admit: 2015-01-30 | Discharge: 2015-01-30 | Disposition: A | Discharge status: Home / Self Care | Payer: BLUE CROSS/BLUE SHIELD | Source: Ambulatory Visit | Attending: Cardiology | Admitting: Cardiology

## 2015-01-30 DIAGNOSIS — I7121 Aneurysm of the ascending aorta, without rupture: Secondary | ICD-10-CM

## 2015-01-30 DIAGNOSIS — I77810 Thoracic aortic ectasia: Secondary | ICD-10-CM

## 2015-01-30 DIAGNOSIS — I712 Thoracic aortic aneurysm, without rupture: Secondary | ICD-10-CM

## 2015-01-30 MED ORDER — IOHEXOL 350 MG/ML SOLN: 100.0000 mL | Freq: Once | INTRAVENOUS | Status: DC | PRN

## 2015-02-06 ENCOUNTER — Inpatient Hospital Stay: Admission: RE | Admit: 2015-02-06 | Payer: BLUE CROSS/BLUE SHIELD | Source: Ambulatory Visit

## 2015-02-09 ENCOUNTER — Ambulatory Visit (INDEPENDENT_AMBULATORY_CARE_PROVIDER_SITE_OTHER)
Admission: RE | Admit: 2015-02-09 | Discharge: 2015-02-09 | Disposition: A | Discharge status: Home / Self Care | Payer: BLUE CROSS/BLUE SHIELD | Source: Ambulatory Visit | Attending: Cardiology | Admitting: Cardiology

## 2015-02-09 DIAGNOSIS — I712 Thoracic aortic aneurysm, without rupture: Secondary | ICD-10-CM | POA: Diagnosis not present

## 2015-02-09 MED ORDER — IOHEXOL 350 MG/ML SOLN
80.0000 mL | Freq: Once | INTRAVENOUS | Status: AC | PRN
  Administered 2015-02-09: 80 mL via INTRAVENOUS

## 2015-02-12 ENCOUNTER — Other Ambulatory Visit: Payer: Self-pay | Admitting: *Deleted

## 2015-02-12 DIAGNOSIS — I719 Aortic aneurysm of unspecified site, without rupture: Secondary | ICD-10-CM

## 2015-03-06 ENCOUNTER — Encounter: Payer: Self-pay | Admitting: Cardiology

## 2015-03-09 ENCOUNTER — Encounter: Payer: Self-pay | Admitting: Cardiology

## 2015-03-09 ENCOUNTER — Ambulatory Visit (INDEPENDENT_AMBULATORY_CARE_PROVIDER_SITE_OTHER): Payer: BLUE CROSS/BLUE SHIELD | Admitting: Cardiology

## 2015-03-09 VITALS — BP 124/78 | HR 68 | Ht 67.0 in | Wt 182.0 lb

## 2015-03-09 DIAGNOSIS — Z954 Presence of other heart-valve replacement: Secondary | ICD-10-CM

## 2015-03-09 DIAGNOSIS — I712 Thoracic aortic aneurysm, without rupture: Secondary | ICD-10-CM

## 2015-03-09 DIAGNOSIS — D497 Neoplasm of unspecified behavior of endocrine glands and other parts of nervous system: Secondary | ICD-10-CM

## 2015-03-09 DIAGNOSIS — Z952 Presence of prosthetic heart valve: Secondary | ICD-10-CM

## 2015-03-09 DIAGNOSIS — I7121 Aneurysm of the ascending aorta, without rupture: Secondary | ICD-10-CM

## 2015-03-09 LAB — LIPID PANEL
Cholesterol: 195 mg/dL (ref 125–200)
HDL: 44 mg/dL — ABNORMAL LOW (ref >=46)
LDL CALC: 119 mg/dL (ref <130)
TRIGLYCERIDES: 158 mg/dL — AB (ref <150)
Total CHOL/HDL Ratio: 4.4 Ratio (ref <=5.0)
VLDL: 32 mg/dL — ABNORMAL HIGH (ref <30)

## 2015-03-09 NOTE — Patient Instructions (Signed)
Medication Instructions:  Your physician recommends that you continue on your current medications as directed. Please refer to the Current Medication list given to you today.  If you need a refill on your cardiac medications before your next appointment, please call your pharmacy.   Labwork: Your physician recommends that you return for lab work in: TODAY (Lipids)   Testing/Procedures: Your physician has requested that you have an echocardiogram. Echocardiography is a painless test that uses sound waves to create images of your heart. It provides your doctor with information about the size and shape of your heart and how well your heart's chambers and valves are working. This procedure takes approximately one hour. There are no restrictions for this procedure. September 2017  Your physician has requested that you have an MR Angiogram of the Chest. September 2017  Follow-Up: Your physician wants you to follow-up in: 12 months with Dr. Aundra Dubin. You will receive a reminder letter in the mail two months in advance. If you don't receive a letter, please call our office to schedule the follow-up appointment.   Any Other Special Instructions Will Be Listed Below (If Applicable).

## 2015-03-11 NOTE — Progress Notes (Signed)
Patient ID: [name] Short, female   DOB: September 11, 1970, 44 y.o.   MRN: [id number] Referring MD: Dr. Helane Rima  44 yo with history of bicuspid aortic valve disorder developed severe aortic insufficiency in 2008.  She additionally was found to have an ascending aortic aneurysm.  She had AVR + supracoronary ascending aorta replacement in 2008.  She had a bioprosthetic aortic valve implanted since she wanted to have a child.  Last echo in 7/15 showed normal EF with stable bioprosthetic aortic valve.  MRA chest in 2/14 showed stable repaired proximal ascending aorta with 4.0 cm distal ascending aorta.  Stress echo in 7/15 was normal.  MRA chest (9/16) showed some progression of dilation, 4.4 cm ascending aorta.    In general, she has been doing quite well lately.  She is reasonably active and exercises regularly.  She kick-boxes.  No exertional dyspnea, no chest pain. No palpitations or syncopal episodes.    Labs (8/13): K 4.2, creatinine 0.75 Labs (11/15): LDL 136, HDL 43  PMH: 1. H/o goiter with thyroid surgery in 8/13.  2. H/o uterine fibroids 3. Bicuspid aortic valve disorder: Developed severe aortic insufficiency.  She had AVR in 2008 with a 25 mm bioprosthetic aortic valve (bioprosthetic because she wanted to get pregnant).  She additionally had a supracoronary ascending aorta replacement for associated ascending aortic aneurysm.  MRA chest (2/14) proximal ascending aorta with stable repair, distal ascending aorta measured 4.0 cm).  Echo (7/15) with EF 55-60%, bioprosthetic aortic valve with mean gradient 16 mmHg, no significant AI, distal ascending aorta 4.0 cm.  MRA chest 9/16 with 4.4 cm ascending aorta.  4. LHC (2008, pre-surgery) with no coronary artery disease, EF 60% on LV-gram.  Stress echo 7/15 normal.   SH: Married, has 1 child.  She formerly worked in a preschool.  Nonsmoker.   FH: Father with bicuspid aortic valve, also s/p AVR.    ROS: All systems reviewed and negative except as per HPI.    Current Outpatient Prescriptions  Medication Sig Dispense Refill  . levothyroxine (SYNTHROID, LEVOTHROID) 25 MCG tablet   4   No current facility-administered medications for this visit.   BP 124/78 mmHg  Pulse 68  Ht 5\' 7"  (1.702 m)  Wt 182 lb (82.555 kg)  BMI 28.50 kg/m2  LMP 03/02/2015 General: NAD Neck: No JVD, no thyromegaly or thyroid nodule.  Lungs: Clear to auscultation bilaterally with normal respiratory effort. CV: Nondisplaced PMI.  Heart regular S1/S2, no S3/S4, 2/6 early SEM RUSB.  No peripheral edema.  No carotid bruit.  Normal pedal pulses.  Abdomen: Soft, nontender, no hepatosplenomegaly, no distention.  Skin: Intact without lesions or rashes.  Neurologic: Alert and oriented x 3.  Psych: Normal affect. Extremities: No clubbing or cyanosis.  HEENT: Normal.   Assessment/Plan:  44 yo with bicuspid aortic valve disorder.  She is status post bioprosthetic aortic valve replacement an supracoronary ascending aorta replacement.  She has been doing well symptomatically.  She will need periodic screening of her valve and of her thoracic aorta.  People with bicuspid aortic valve disorder often will get thoracic aneurysms. BP is not elevated. - Echo in 7/15 showed stable valve.  Will repeat echo in 2017.  - Some progression of distal ascending aorta dilation on 9/16 MRA, 4.4 cm.  Repeat MRA in 9/17.  - Will check lipids today.   Loralie Champagne 03/11/2015 2:10 PM

## 2015-07-24 ENCOUNTER — Ambulatory Visit (INDEPENDENT_AMBULATORY_CARE_PROVIDER_SITE_OTHER): Payer: BLUE CROSS/BLUE SHIELD | Admitting: Family Medicine

## 2015-07-24 VITALS — BP 122/80 | HR 80 | Temp 98.1°F | Resp 16 | Ht 67.0 in | Wt 180.0 lb

## 2015-07-24 DIAGNOSIS — S9032XA Contusion of left foot, initial encounter: Secondary | ICD-10-CM | POA: Diagnosis not present

## 2015-07-24 NOTE — Progress Notes (Signed)
Patient ID: [name] Short, female   DOB: [birth year], 45 y.o.   MRN: [id number]  By signing my name below, I, [name] Short, attest that this documentation has been prepared under the direction and in the presence of Robyn Haber, MD Electronically Signed: Ladene Artist, ED Scribe 07/24/2015 at 12:29 PM.  Patient ID: [name] Short MRN: [id number], DOB: 08-17-1970, 45 y.o. Date of Encounter: 07/24/2015, 12:30 PM  Primary Physician: No PCP Per Patient  Chief Complaint:  Chief Complaint  Patient presents with  . Foot Injury    left, x last night    HPI: 45 y.o. year old female with history below presents with a foot injury sustained last night. Pt states that she kicked a table leg last night and injured the top of her foot. She reports mild pain to the area that is exacerbated with movement and palpation. Pt reports associated swelling to the area. She is able to ambulate without difficulty. Pt reports that she does kick boxing and is left foot dominate.   Pt is a stay-at-home mom.   Past Medical History  Diagnosis Date  . Congenital heart defect     bicuspid aortic valve, aortic insufficeincy, ascending aortic aneurysm => s/p AVR (bioprosthetic) and ascending aortic aneurysm repair in 2008  . Multiple thyroid nodules     H/o goiter with thyroid surgery in 8/13  . Breast feeding status of mother   . History of uterine fibroid   . Hx of cardiac cath     [facility] (2008, pre-surgery) with no coronary artery disease, EF 60% on LV-gram.  . Hx of echocardiogram     Echo (7/15):  Mod LVH, EF 55-60%, no RWMA, + diast dysfunction, AVR ok (mean 16 mmHg), asc aorta 4 cm, trivial MR  . Hx of cardiovascular stress test     ETT-Echo (7/15):  normal  . Heart murmur     Home Meds: Prior to Admission medications   Medication Sig Start Date End Date Taking? Authorizing Provider  levothyroxine (SYNTHROID, LEVOTHROID) 25 MCG tablet  12/14/14  Yes Historical Provider, MD   Allergies:  Allergies  Allergen  Reactions  . Amoxicillin Itching  . Bactroban [Mupirocin] Itching, Swelling and Other (See Comments)    Numbness of upper lip    Social History   Social History  . Marital Status: Married    Spouse Name: N/A  . Number of Children: N/A  . Years of Education: N/A   Occupational History  . Not on file.   Social History Main Topics  . Smoking status: Never Smoker   . Smokeless tobacco: Never Used  . Alcohol Use: 3.0 oz/week    5 Standard drinks or equivalent per week     Comment: a drink every night to every other night  . Drug Use: No  . Sexual Activity: Not on file   Other Topics Concern  . Not on file   Social History Narrative    Review of Systems: Constitutional: negative for chills, fever, night sweats, weight changes, or fatigue  HEENT: negative for vision changes, hearing loss, congestion, rhinorrhea, ST, epistaxis, or sinus pressure Cardiovascular: negative for chest pain or palpitations Respiratory: negative for hemoptysis, wheezing, shortness of breath, or cough Abdominal: negative for abdominal pain, nausea, vomiting, diarrhea, or constipation Msk: +arthralgias, +joint swelling  Dermatological: negative for rash Neurologic: negative for headache, dizziness, or syncope All other systems reviewed and are otherwise negative with the exception to those above and in the HPI.  Physical Exam:  Blood pressure 122/80, pulse 80, temperature 98.1 F (36.7 C), temperature source Oral, resp. rate 16, height 5\' 7"  (1.702 m), weight 180 lb (81.647 kg), last menstrual period 07/04/2015, SpO2 99 %., Body mass index is 28.19 kg/(m^2). General: Well developed, well nourished, in no acute distress. Head: Normocephalic, atraumatic, eyes without discharge, sclera non-icteric, nares are without discharge. Bilateral auditory canals clear, TM's are without perforation, pearly grey and translucent with reflective cone of light bilaterally. Oral cavity moist, posterior pharynx without  exudate, erythema, peritonsillar abscess, or post nasal drip.  Neck: Supple. No thyromegaly. Full ROM. No lymphadenopathy. Lungs: Clear bilaterally to auscultation without wheezes, rales, or rhonchi. Breathing is unlabored. Heart: RRR with S1 S2. No murmurs, rubs, or gallops appreciated. Abdomen: Soft, non-tender, non-distended with normoactive bowel sounds. No hepatomegaly. No rebound/guarding. No obvious abdominal masses. Msk:  Strength and tone normal for age. L foot: Swollen dorsal proximal foot with normal bony contour. No ecchymosis. Full ROM.  Extremities/Skin: Warm and dry. No clubbing or cyanosis. No edema. No rashes or suspicious lesions. Neuro: Alert and oriented X 3. Moves all extremities spontaneously. Gait is normal. CNII-XII grossly in tact. Psych:  Responds to questions appropriately with a normal affect.   Labs:  ASSESSMENT AND PLAN:  45 y.o. year old female with  1. Foot contusion, left, initial encounter     This chart was scribed in my presence and reviewed by me personally.    ICD-9-CM ICD-10-CM   1. Foot contusion, left, initial encounter 924.20 S90.32XA      Signed, Robyn Haber, MD   Signed, Robyn Haber, MD 07/24/2015 12:30 PM

## 2015-09-13 DIAGNOSIS — E890 Postprocedural hypothyroidism: Secondary | ICD-10-CM | POA: Diagnosis not present

## 2015-09-25 DIAGNOSIS — Z6829 Body mass index (BMI) 29.0-29.9, adult: Secondary | ICD-10-CM | POA: Diagnosis not present

## 2015-09-25 DIAGNOSIS — Z1212 Encounter for screening for malignant neoplasm of rectum: Secondary | ICD-10-CM | POA: Diagnosis not present

## 2015-09-25 DIAGNOSIS — Z01419 Encounter for gynecological examination (general) (routine) without abnormal findings: Secondary | ICD-10-CM | POA: Diagnosis not present

## 2015-09-26 DIAGNOSIS — H33322 Round hole, left eye: Secondary | ICD-10-CM | POA: Diagnosis not present

## 2015-10-08 ENCOUNTER — Ambulatory Visit: Payer: Self-pay | Admitting: Surgery

## 2015-10-08 DIAGNOSIS — Z800 Family history of malignant neoplasm of digestive organs: Secondary | ICD-10-CM | POA: Diagnosis not present

## 2015-10-08 DIAGNOSIS — Z8041 Family history of malignant neoplasm of ovary: Secondary | ICD-10-CM | POA: Diagnosis not present

## 2015-10-08 DIAGNOSIS — Z8042 Family history of malignant neoplasm of prostate: Secondary | ICD-10-CM | POA: Diagnosis not present

## 2015-10-08 NOTE — H&P (Signed)
[name] Short 10/08/2015 1:45 PM Location: [facility] Patient #: [id number] DOB: Oct 16, 1970 Married / Language: [name] Short / Race: White Female  History of Present Illness [name] A. Evens Meno MD; 10/08/2015 2:43 PM) Patient words: GB       Patient presents for evaluation for cholecystectomy secondary to strong family history of gallbladder cancer, prostate cancer and ovarian cancer. The patient's mother died of metastatic gallbladder cancer at the age of 40 as did her grandmother. She denies abdominal pain nausea or vomiting. She has never had an ultrasound of her gallbladder. She has never had genetic screening.  The patient is a 45 year old female.   Other Problems [name] Short, [place]; 10/08/2015 1:46 PM) Anxiety Disorder Heart murmur Hemorrhoids Other disease, cancer, significant illness Thyroid Disease  Past Surgical History [name] Short, [place]; 10/08/2015 1:46 PM) Aneurysm Repair Cesarean Section - 1 Thyroid Surgery Valve Replacement  Diagnostic Studies History [name] Short, [place]; 10/08/2015 1:46 PM) Colonoscopy never Mammogram within last year Pap Smear 1-5 years ago  Allergies [name] Short, CMA; 10/08/2015 1:46 PM) Amoxicillin *PENICILLINS*  Medication History [name] Short, CMA; 10/08/2015 1:46 PM) Levothyroxine Sodium (25MCG Tablet, Oral) Active. Medications Reconciled  Social History [name] Short, [place]; 10/08/2015 1:46 PM) Alcohol use Moderate alcohol use. Caffeine use Coffee. No drug use Tobacco use Never smoker.  Family History [name] Short, [place]; 10/08/2015 1:46 PM) Alcohol Abuse Father. Anesthetic complications Father. Cancer Mother. Depression Brother, Sister. Heart Disease Father. Heart disease in female family member before age 110 Hypertension Mother. Melanoma Father. Migraine Headache Sister. Prostate Cancer Father. Thyroid problems Sister.  Pregnancy / Birth History  [name] Short, [place]; 10/08/2015 1:46 PM) Age at menarche 53 years. Contraceptive History Oral contraceptives. Gravida 2 Irregular periods Maternal age 90-20 Para 1     Review of Systems [name] Short CMA; 10/08/2015 1:46 PM) General Present- Night Sweats. Not Present- Appetite Loss, Chills, Fatigue, Fever, Weight Gain and Weight Loss. Skin Not Present- Change in Wart/Mole, Dryness, Hives, Jaundice, New Lesions, Non-Healing Wounds, Rash and Ulcer. HEENT Present- Seasonal Allergies and Wears glasses/contact lenses. Not Present- Earache, Hearing Loss, Hoarseness, Nose Bleed, Oral Ulcers, Ringing in the Ears, Sinus Pain, Sore Throat, Visual Disturbances and Yellow Eyes. Respiratory Not Present- Bloody sputum, Chronic Cough, Difficulty Breathing, Snoring and Wheezing. Breast Not Present- Breast Mass, Breast Pain, Nipple Discharge and Skin Changes. Cardiovascular Not Present- Chest Pain, Difficulty Breathing Lying Down, Leg Cramps, Palpitations, Rapid Heart Rate, Shortness of Breath and Swelling of Extremities. Gastrointestinal Not Present- Abdominal Pain, Bloating, Bloody Stool, Change in Bowel Habits, Chronic diarrhea, Constipation, Difficulty Swallowing, Excessive gas, Gets full quickly at meals, Hemorrhoids, Indigestion, Nausea, Rectal Pain and Vomiting. Female Genitourinary Not Present- Frequency, Nocturia, Painful Urination, Pelvic Pain and Urgency. Musculoskeletal Not Present- Back Pain, Joint Pain, Joint Stiffness, Muscle Pain, Muscle Weakness and Swelling of Extremities. Neurological Not Present- Decreased Memory, Fainting, Headaches, Numbness, Seizures, Tingling, Tremor, Trouble walking and Weakness. Psychiatric Not Present- Anxiety, Bipolar, Change in Sleep Pattern, Depression, Fearful and Frequent crying. Endocrine Not Present- Cold Intolerance, Excessive Hunger, Hair Changes, Heat Intolerance, Hot flashes and New Diabetes. Hematology Not Present- Easy Bruising, Excessive  bleeding, Gland problems, HIV and Persistent Infections.  V[employer] R. Short CMA; 10/08/2015 1:46 PM) 10/08/2015 1:46 PM Weight: 183.25 lb Height: 67in Body Surface Area: 1.95 m Body Mass Index: 28.7 kg/m  BP: 114/76 (Sitting, Left Arm, Standard)      Physical Exam (Kiyoshi Schaab A. Micheil Klaus MD; 10/08/2015 2:43 PM)  General  Mental Status-Alert. General Appearance-Consistent with stated age. Hydration-Well hydrated. Voice-Normal.  Head and Neck Head-normocephalic, atraumatic with no lesions or palpable masses.  Eye Eyeball - Bilateral-Extraocular movements intact. Sclera/Conjunctiva - Bilateral-No scleral icterus.  Chest and Lung Exam Chest and lung exam reveals -quiet, even and easy respiratory effort with no use of accessory muscles and on auscultation, normal breath sounds, no adventitious sounds and normal vocal resonance. Inspection Chest Wall - Normal. Back - normal.  Cardiovascular Cardiovascular examination reveals -on palpation PMI is normal in location and amplitude, no palpable S3 or S4. Normal cardiac borders., normal heart sounds, regular rate and rhythm with no murmurs, carotid auscultation reveals no bruits and normal pedal pulses bilaterally.  Abdomen Inspection Inspection of the abdomen reveals - No Hernias. Skin - Scar - no surgical scars. Palpation/Percussion Palpation and Percussion of the abdomen reveal - Soft, Non Tender, No Rebound tenderness, No Rigidity (guarding) and No hepatosplenomegaly. Auscultation Auscultation of the abdomen reveals - Bowel sounds normal.  Neurologic Neurologic evaluation reveals -alert and oriented x 3 with no impairment of recent or remote memory. Mental Status-Normal.  Musculoskeletal Normal Exam - Left-Upper Extremity Strength Normal and Lower Extremity Strength Normal. Normal Exam - Right-Upper Extremity Strength Normal, Lower Extremity Weakness.    Assessment & Plan (Tyesha Joffe A.  Chundra Sauerwein MD; 10/08/2015 2:43 PM)  FAMILY HISTORY OF CANCER OF GALLBLADDER (Z80.0) Impression: Discuss surgical options. Strong family history of gallbladder, prostate and ovarian cancer. Recommend genetic screening. She is interested in cholecystectomy since she had first degree relatives died of gallbladder cancer in their 85s. Discussed the pros and cons of this as well as potential complications. Recommend ultrasound of gallbladder preop. After discussion patient desires cholecystectomy. Discussed laparoscopic cholecystectomy with the pros and cons of surgery as well as nonsurgical management. Reviewed surgical complications of the procedure with her and long-term expectations afterwards. The procedure has been discussed with the patient. Risks of laparoscopic cholecystectomy include bleeding, infection, bile duct injury, leak, death, open surgery, diarrhea, other surgery, organ injury, blood vessel injury, DVT, and additional care.  FAMILY HISTORY OF PROSTATE CANCER (Z80.42)  FAMILY HISTORY OF OVARIAN CANCER (Z80.41)  Current Plans Pt Education - [facility] Laparoscopic [facility] You are being scheduled for surgery - Our schedulers will call you.  You should hear from our office's scheduling department within 5 working days about the location, date, and time of surgery. We try to make accommodations for patient's preferences in scheduling surgery, but sometimes the OR schedule or the surgeon's schedule prevents [place] from making those accommodations.  If you have not heard from our office [phone]) in 5 working days, call the office and ask for your surgeon's nurse.  If you have other questions about your diagnosis, plan, or surgery, call the office and ask for your surgeon's nurse.  The anatomy & physiology of hepatobiliary & pancreatic function was discussed. The pathophysiology of gallbladder dysfunction was discussed. Natural history risks without surgery was discussed. I feel the risks of no  intervention will lead to serious problems that outweigh the operative risks; therefore, I recommended cholecystectomy to remove the pathology. I explained laparoscopic techniques with possible need for an open approach. Probable cholangiogram to evaluate the bilary tract was explained as well.  Risks such as bleeding, infection, abscess, leak, injury to other organs, need for further treatment, heart attack, death, and other risks were discussed. I noted a good likelihood this will help address the problem. Possibility that this will not correct all abdominal symptoms was explained. Goals of  post-operative recovery were discussed as well. We will work to minimize complications. An educational handout further explaining the pathology and treatment options was given as well. Questions were answered. The patient expresses understanding & wishes to proceed with surgery.  Pt Education - [facility] Laparosopic Post Op HCI ([place]) Pt Education - Laparoscopic Cholecystectomy: gallbladder

## 2015-10-09 ENCOUNTER — Other Ambulatory Visit: Payer: Self-pay | Admitting: Surgery

## 2015-10-09 DIAGNOSIS — Z800 Family history of malignant neoplasm of digestive organs: Secondary | ICD-10-CM

## 2015-10-16 ENCOUNTER — Other Ambulatory Visit: Payer: Self-pay | Admitting: Surgery

## 2015-10-16 ENCOUNTER — Ambulatory Visit
Admission: RE | Admit: 2015-10-16 | Discharge: 2015-10-16 | Disposition: A | Discharge status: Home / Self Care | Payer: BLUE CROSS/BLUE SHIELD | Source: Ambulatory Visit | Attending: Surgery | Admitting: Surgery

## 2015-10-16 ENCOUNTER — Other Ambulatory Visit: Payer: BLUE CROSS/BLUE SHIELD

## 2015-10-16 DIAGNOSIS — Z800 Family history of malignant neoplasm of digestive organs: Secondary | ICD-10-CM | POA: Diagnosis not present

## 2015-10-25 ENCOUNTER — Encounter: Payer: Self-pay | Admitting: Genetic Counselor

## 2015-11-26 DIAGNOSIS — H33322 Round hole, left eye: Secondary | ICD-10-CM | POA: Diagnosis not present

## 2015-11-26 DIAGNOSIS — H2513 Age-related nuclear cataract, bilateral: Secondary | ICD-10-CM | POA: Diagnosis not present

## 2015-12-10 ENCOUNTER — Encounter: Payer: Self-pay | Admitting: Genetic Counselor

## 2015-12-11 ENCOUNTER — Ambulatory Visit (HOSPITAL_BASED_OUTPATIENT_CLINIC_OR_DEPARTMENT_OTHER): Payer: BLUE CROSS/BLUE SHIELD | Admitting: Genetic Counselor

## 2015-12-11 ENCOUNTER — Other Ambulatory Visit: Payer: BLUE CROSS/BLUE SHIELD

## 2015-12-11 ENCOUNTER — Encounter: Payer: BLUE CROSS/BLUE SHIELD | Admitting: Genetic Counselor

## 2015-12-11 DIAGNOSIS — Z800 Family history of malignant neoplasm of digestive organs: Secondary | ICD-10-CM

## 2015-12-11 DIAGNOSIS — Z8543 Personal history of malignant neoplasm of ovary: Secondary | ICD-10-CM

## 2015-12-11 DIAGNOSIS — Z8041 Family history of malignant neoplasm of ovary: Secondary | ICD-10-CM

## 2015-12-11 DIAGNOSIS — Z315 Encounter for genetic counseling: Secondary | ICD-10-CM

## 2015-12-11 DIAGNOSIS — Z8639 Personal history of other endocrine, nutritional and metabolic disease: Secondary | ICD-10-CM

## 2015-12-11 DIAGNOSIS — Z8042 Family history of malignant neoplasm of prostate: Secondary | ICD-10-CM

## 2015-12-12 ENCOUNTER — Encounter: Payer: Self-pay | Admitting: Genetic Counselor

## 2015-12-12 ENCOUNTER — Other Ambulatory Visit (HOSPITAL_COMMUNITY): Payer: Self-pay | Admitting: *Deleted

## 2015-12-12 ENCOUNTER — Encounter (HOSPITAL_COMMUNITY): Payer: Self-pay

## 2015-12-12 DIAGNOSIS — Z800 Family history of malignant neoplasm of digestive organs: Secondary | ICD-10-CM | POA: Insufficient documentation

## 2015-12-12 NOTE — Progress Notes (Addendum)
REFERRING PROVIDER: [name], MD [place], [place] [address]  PRIMARY PROVIDER:  No PCP Per Patient  PRIMARY REASON FOR VISIT:  1. Family history of cancer of gallbladder   2. History of thyroid nodule   3. Family history of ovarian cancer   4. Family history of prostate cancer in father      HISTORY OF PRESENT ILLNESS:   Ms. Neumeier, a 45 y.o. female, was seen for a [facility] cancer genetics consultation at the request of Dr. Brantley Stage due to a family history of gall bladder and other cancers.  Ms. Milleson presents to clinic today to discuss the possibility of a hereditary predisposition to cancer, genetic testing, and to further clarify her future cancer risks, as well as potential cancer risks for family members.    Ms. Fayson is a 45 y.o. female with no personal history of cancer.  She reports a history of precancerous mole removals.  She also has a history of multiple thyroid nodules and adenomatous goiter, which was treated with a right lobectomy in August 2013 at the age of 41.  She also has a history of open heart surgery, which, at the time, found "subtle stromal myxoid change" of the heart valve.   HORMONAL RISK FACTORS:  Menarche was at age 52.  First live birth at age 42.  OCP use for approximately 15 years.  Ovaries intact: yes.  Hysterectomy: no.  Menopausal status: perimenopausal.  HRT use: levothyroxine (current) for 4 years post-partial thyroidectomy. Colonoscopy: no; not examined. Mammogram within the last year: yes. Number of breast biopsies: 0. Up to date with pelvic exams:  yes. Any excessive radiation exposure/other exposures in the past:  no, but does report history of secondhand smoke exposure (parents smoked)  Past Medical History:  Diagnosis Date  . Breast feeding status of mother   . Complication of anesthesia   . Congenital heart defect    bicuspid aortic valve, aortic insufficeincy, ascending aortic aneurysm => s/p AVR  (bioprosthetic) and ascending aortic aneurysm repair in 2008  . Family history of adverse reaction to anesthesia    Dad who also had CAD (valve disease) "coded" with anesthesia   . Heart murmur   . History of uterine fibroid   . Hx of cardiac cath    [facility] (2008, pre-surgery) with no coronary artery disease, EF 60% on LV-gram.  . Hx of cardiovascular stress test    ETT-Echo (7/15):  normal  . Hx of echocardiogram    Echo (7/15):  Mod LVH, EF 55-60%, no RWMA, + diast dysfunction, AVR ok (mean 16 mmHg), asc aorta 4 cm, trivial MR  . Hypothyroidism   . Multiple thyroid nodules    H/o goiter with thyroid surgery in 8/13  . PONV (postoperative nausea and vomiting)     Past Surgical History:  Procedure Laterality Date  . AORTIC VALVE REPLACEMENT     with replacement of ascending aorta  . CARDIAC CATHETERIZATION    . CARDIAC VALVE REPLACEMENT    . CESAREAN SECTION  2011  . MYOMECTOMY    . open heart surgery    . THYROID LOBECTOMY  12/31/2011   Procedure: THYROID LOBECTOMY;  Surgeon: Izora Gala, MD;  Location: [facility];  Service: ENT;  Laterality: Right;  Substernal Goiter Removal  . UTERINE FIBROID SURGERY  2003    Social History   Social History  . Marital status: Married    Spouse name: N/A  . Number of children: N/A  .  Years of education: N/A   Social History Main Topics  . Smoking status: Never Smoker  . Smokeless tobacco: Never Used     Comment: only smoked very infrequently in college  . Alcohol use 3.0 oz/week    5 Standard drinks or equivalent per week     Comment: 1 glass wine per day  . Drug use: No  . Sexual activity: Not Asked   Other Topics Concern  . None   Social History Narrative  . None     FAMILY HISTORY:  We obtained a detailed, 4-generation family history.  Significant diagnoses are listed below: Family History  Problem Relation Age of Onset  . Cancer Mother 36    Gall bladder; found at time gallstone found; smoker  . Hypertension Mother   .  Gallstones Mother   . Other Mother     hx of hysterectomy for unspecified reason  . Prostate cancer Father     dx early 63s; not very aggressive; s/p seed implants  . Skin cancer Father     unspecified type; +sun exposure  . Heart Problems Father     same as pt's; s/p surgery  . Goiter Sister   . Hypothyroidism Sister   . Thyroid disease Sister   . Cancer Maternal Grandmother     Gallbladder dx. 58-59; smoker  . Heart disease Maternal Grandfather   . Heart Problems Maternal Grandfather     heart disease  . Heart Problems Paternal Uncle     heart disease, d. 31s; +EtOH abuse  . Other Sister     hx of 1 benign breast biopys  . Ovarian cancer Other     maternal great aunt (MGM's sister); lim info    Ms. Kaas has one daughter who is currently 40 years old.  She has one full brother and two full sisters, ages 42-50, none of whom have had cancer.  Her oldest sister has a history of a benign breast biopsy and her youngest sister has a history of thyroid issues.  Ms. Kirshenbaum mother was diagnosed with gallbladder cancer at 18, an incidental finding at the time of work-up due to gallstones.  This cancer metastasized to her liver and brain and she passed away at age 53.    Ms. Scogin mother was a smoker and she also had a history of a hysterectomy for an unspecified reason.  Ms. Klammer mother had two full sisters and one full brother.  Her brother passed away from suicide in his 38s.  Her two sisters are currently in their 65s, and Ms. Ashton is not aware of any cancer history for these two relatives.  Her maternal grandmother was diagnosed with a gallbladder cancer and also passed away at 52.  She was also a smoker.  She had at one sister who Ms. Naim has heard from other family members had a history of ovarian cancer.  She has no further information for this relative.  Ms. Winward maternal grandfather died of heart disease in his 75s.  Ms. Lueth has no further information for other maternal great  aunts/uncles or great grandparents.  Ms. Broner father is currently 89.  He was diagnosed with prostate cancer in his early 82s.  Ms. Beddow describes this as "not a very aggressive" cancer and was treated with seed implants.  Her father also has a history of possible skin cancers, which she attributes to frequent sun exposure.  He has had similar heart issues to that of Ms. Cienfuegos.  He  had one full brother and one maternal half-sister.  His brother died of heart disease and alcohol abuse in his 54s.  Ms. Holaday reports that her father's half-sister is likely in her 1s, but she has no further information for her.  She has no information for her paternal grandparents, as her father was abandoned as a kid.  She has heard through family members that there is a "great uncle" to her dad who had stomach cancer, but she has no further information.  She reports no known prior family history of genetic testing for hereditary cancer.  Patient's maternal ancestors are of Caucasian/German descent, and paternal ancestors are of Caucasian descent. There is no reported Ashkenazi Jewish ancestry. There is no known consanguinity.  GENETIC COUNSELING ASSESSMENT: VONZELLA ALTHAUS is a 45 y.o. female with a family history of gallbladder and other cancers. We, therefore, discussed and recommended the following at today's visit.   DISCUSSION: We reviewed the characteristics, features and inheritance patterns of hereditary cancer syndromes. We discussed that approximately 5-10% of cancers are genetic.  We discussed that certain "red flags" can make [place] more suspicious of genetic causes of cancer.  Those red flags include, early ages of cancer diagnosis, multiple cases of certain cancers in the family, cancer in multiple generations, and multiple cancers for one individual, among other.  We discussed that gallbladder cancer is a rarer type of cancer, but that we do not have much information for specific genetic causes for gallbladder  cancer.  We discussed that some studies have suggested an increased risk for gallbladder cancers in relationship to BRCA1/2 pathogenic gene mutations, but that we do not have enough information to determine a significant related cancer risk at this time.  We also discussed other genes, such as those associated with Lynch syndrome and increased risks for other gastrointestinal cancers.  We further discussed the PRKAR1A gene due to Ms. Garnet Sierras history of myxoid cardiology findings and history of thyroid nodules.  We reviewed other features of the associated syndrome, Carney Complex, and it does not seem like there is anything else in Ms. Wilhelmsen personal or family history that is concerning for this condition.  We discussed that Ms. Desanctis does not meet medical criteria for insurance coverage of genetic testing, but we did discuss self-pay genetic testing options.  Ms. Tax family history of gallbladder cancer is suspicious, but we do not currently have much understanding of genetic involvement in gallbladder cancer risks.  We discussed that we could still order testing of BRCA1/2 genes, Lynch syndrome, and other GI genes to see if we find anything for what we can currently offer testing for.  If we order genetic testing, we would likely do so through [facility] [place], [place]).  At a minimum, we would order the 42-gene [facility] Common Hereditary Cancers Panel (Breast, Gyn, GI).  This test would cost $250.  If we wanted to add analysis of the PRKAR1A gene to cover all of our bases, that would make this a 43-gene test and would instead cost $475.  Ms. Larkin would like to find out more about the family history of cancer from her sister and other relatives.  She will call and let us know if she discovers any further information.  Thus, she has decided not to undergo genetic testing today, but will consider this option further.  She has our contact information and will contact us, if she changes her mind and  would like to pursue this genetic testing.  PLAN: Thus,  despite our recommendation, Ms. Millican did not wish to pursue genetic testing at today's visit. We understand this decision, and remain available to coordinate genetic testing at any time in the future. We, therefore, recommend Ms. Douglass continue to follow the cancer screening guidelines given by her primary healthcare provider.  We also recommend that she contact us once she finds out more information about the family history of cancer.  Lastly, we encouraged Ms. Dipierro to remain in contact with cancer genetics annually so that we can continuously update the family history and inform her of any changes in cancer genetics and testing that may be of benefit for this family.   Ms.  Allington questions were answered to her satisfaction today. Our contact information was provided should additional questions or concerns arise. Thank you for the referral and allowing [place] to share in the care of your patient.   Jeanine Luz, MS, [facility] Certified Genetic Counselor [email or web address] Phone: [phone]  The patient was seen for a total of 60 minutes in face-to-face genetic counseling.  This patient was discussed with Drs. Magrinat, Lindi Adie and/or Burr Medico who agrees with the above.    _______________________________________________________________________ For Office Staff:  Number of people involved in session: 1 Was an Intern/ student involved with case: no

## 2015-12-12 NOTE — Pre-Procedure Instructions (Signed)
DEDRE SHORTS  12/12/2015      [id number] - [name], [name] [address] Phone: [phone] Fax: [phone]    Your procedure is scheduled on 12-20-2015    Thursday .  Report to [facility] Admitting at 5:30 A.M.   Call this number if you have problems the morning of surgery:  [phone]   Remember:  Do not eat food or drink liquids after midnight .  Take these medicines the morning of surgery with A SIP OF WATER: levothyroxine(Synthroid)            STOP ASPIRIN,ANTIINFLAMATORIES (IBUPROFEN,ALEVE,MOTRIN,ADVIL,GOODY'S POWDERS),HERBAL SUPPLEMENTS,FISH OIL,AND VITAMINS 5-7 DAYS PRIOR TO SURGERY   Do not wear jewelry, make-up or nail polish.   Do not wear lotions, powders, or perfumes.  You may not wear deoderant.   Do not shave 48 hours prior to surgery.  .  Do not bring valuables to the hospital.   [facility] is not responsible for any belongings or valuables.  Contacts, dentures or bridgework may not be worn into surgery.  Leave your suitcase in the car.  After surgery it may be brought to your room.  For patients admitted to the hospital, discharge time will be determined by your treatment team.  Patients discharged the day of surgery will not be allowed to drive home.   Special instructions:  [facility] - Preparing for Surgery  Before surgery, you can play an important role.  Because skin is not sterile, your skin needs to be as free of germs as possible.  You can reduce the number of germs on you skin by washing with CHG (chlorahexidine gluconate) soap before surgery.  CHG is an antiseptic cleaner which kills germs and bonds with the skin to continue killing germs even after washing.  Please DO NOT use if you have an allergy to CHG or antibacterial soaps.  If your skin becomes reddened/irritated stop using the CHG and inform your nurse when you  arrive at Short Stay.  Do not shave (including legs and underarms) for at least 48 hours prior to the first CHG shower.  You may shave your face.  Please follow these instructions carefully:   1.  Shower with CHG Soap the night before surgery and the   morning of Surgery.  2.  If you choose to wash your hair, wash your hair first as usual with you normal shampoo.  3.  After you shampoo, rinse your hair and body thoroughly to remove the                      Shampoo.  4.  Use CHG as you would any other liquid soap.  You can apply chg directly to the skin and wash gently with scrungie or a clean washcloth.  5.  Apply the CHG Soap to your body ONLY FROM THE NECK DOWN.  Do not use on open wounds or open sores.  Avoid contact with your eyes, ears, mouth and genitals (private parts).  Wash genitals (private parts) with your normal soap.  6.  Wash thoroughly, paying special attention to the area where your surgery  will be performed.  7.  Thoroughly rinse your body with warm water from the neck down.  8.  DO NOT shower/wash with your normal soap after using and rinsing off  he CHG Soap.  9.  Fraser Din  yourself dry with a clean towel.            10.  Wear clean pajamas.            11.  Place clean sheets on your bed the night of your first shower and do not  sleep with pets.  Day of Surgery  Do not apply any lotions/deoderants the morning of surgery.  Please wear clean clothes to the hospital/surgery center.    Please read over the following fact sheets that you were given. Coughing and Deep Breathing and Surgical Site Infection Prevention

## 2015-12-13 ENCOUNTER — Encounter (HOSPITAL_COMMUNITY): Payer: Self-pay

## 2015-12-13 ENCOUNTER — Encounter (HOSPITAL_COMMUNITY)
Admission: RE | Admit: 2015-12-13 | Discharge: 2015-12-13 | Disposition: A | Discharge status: Home / Self Care | Payer: BLUE CROSS/BLUE SHIELD | Source: Ambulatory Visit | Attending: Surgery | Admitting: Surgery

## 2015-12-13 DIAGNOSIS — Z01818 Encounter for other preprocedural examination: Secondary | ICD-10-CM | POA: Insufficient documentation

## 2015-12-13 DIAGNOSIS — Z953 Presence of xenogenic heart valve: Secondary | ICD-10-CM | POA: Diagnosis not present

## 2015-12-13 DIAGNOSIS — Z79899 Other long term (current) drug therapy: Secondary | ICD-10-CM | POA: Diagnosis not present

## 2015-12-13 DIAGNOSIS — E039 Hypothyroidism, unspecified: Secondary | ICD-10-CM | POA: Diagnosis not present

## 2015-12-13 DIAGNOSIS — Z95828 Presence of other vascular implants and grafts: Secondary | ICD-10-CM | POA: Insufficient documentation

## 2015-12-13 DIAGNOSIS — Z01812 Encounter for preprocedural laboratory examination: Secondary | ICD-10-CM | POA: Insufficient documentation

## 2015-12-13 DIAGNOSIS — Z8774 Personal history of (corrected) congenital malformations of heart and circulatory system: Secondary | ICD-10-CM | POA: Insufficient documentation

## 2015-12-13 DIAGNOSIS — Z800 Family history of malignant neoplasm of digestive organs: Secondary | ICD-10-CM | POA: Insufficient documentation

## 2015-12-13 HISTORY — DX: Adverse effect of unspecified anesthetic, initial encounter: T41.45XA

## 2015-12-13 HISTORY — DX: Family history of other specified conditions: Z84.89

## 2015-12-13 HISTORY — DX: Other complications of anesthesia, initial encounter: T88.59XA

## 2015-12-13 HISTORY — DX: Hypothyroidism, unspecified: E03.9

## 2015-12-13 LAB — CBC
HCT: 40.5 % (ref 36.0–46.0)
Hemoglobin: 13.6 g/dL (ref 12.0–15.0)
MCH: 30.4 pg (ref 26.0–34.0)
MCHC: 33.6 g/dL (ref 30.0–36.0)
MCV: 90.6 fL (ref 78.0–100.0)
PLATELETS: 171 10*3/uL (ref 150–400)
RBC: 4.47 MIL/uL (ref 3.87–5.11)
RDW: 12.4 % (ref 11.5–15.5)
WBC: 4.8 10*3/uL (ref 4.0–10.5)

## 2015-12-13 LAB — BASIC METABOLIC PANEL
Anion gap: 7 (ref 5–15)
BUN: 10 mg/dL (ref 6–20)
CALCIUM: 9.3 mg/dL (ref 8.9–10.3)
CO2: 25 mmol/L (ref 22–32)
CREATININE: 0.93 mg/dL (ref 0.44–1.00)
Chloride: 106 mmol/L (ref 101–111)
GFR calc Af Amer: >60 mL/min (ref >60)
GLUCOSE: 97 mg/dL (ref 65–99)
Potassium: 4.3 mmol/L (ref 3.5–5.1)
Sodium: 138 mmol/L (ref 135–145)

## 2015-12-13 LAB — HCG, SERUM, QUALITATIVE: PREG SERUM: NEGATIVE

## 2015-12-13 NOTE — Progress Notes (Signed)
Call to A. Zelenak,PA-C, reviewed pt.'s history, also reported pt.'s denial of any changes in her chest, to include no changes in her breathing.  Pt. Reports that she is on her way to the gym after this PAT visit, reporting that she is very active.  This chart will be passed on to the anesth. Consult group.

## 2015-12-14 NOTE — Progress Notes (Addendum)
Anesthesia chart review: Patient is a 45 year old female scheduled for laparoscopic cholecystectomy and cholangiogram on 12/20/2015 by Dr. Brantley Stage.  History includes nonsmoker, postoperative nausea and vomiting, congenital bicuspid aortic valve s/p replacement of ascending aorta with 30 mm Hemashield Dacron graft supra coronary and s/p AVR (25 mm, bioprosthethic) 11/03/06 (Dr. Servando Snare), 4.4 cm ascending TAA 01/2015, thyroid nodules s/p right lobectomy 12/31/11 (Dr. Constance Holster), hypothyroidism, uterine fibroids. Reports her father (underlying heart disease) has a history of "coding" with anesthesia.   No PCP, but has been seen at [facility], last by Dr. Joseph Art. Cardiologist is Dr. Aundra Dubin, last 03/09/15. His note mentions getting a follow-up echo in 2017 and repeating imaging (CTA or MRA ?) 01/2016 to re-evaluate TAA.   Meds include ibuprofen, levothyroxine.  PAT Vitals: BP 114/66, HR 74, RR 18, T 36.7C, O2 sat 100%. Patient denied CP, SOB, syncope. She goes to the gym on a regular basis.  03/09/15 EKG: NSR.  12/09/13 Echo: Study Conclusions - Left ventricle: The cavity size was normal. There was moderate concentric hypertrophy. Systolic function was normal. The estimated ejection fraction was in the range of 55% to 60%. Wall motion was normal; there were no regional wall motion abnormalities. There is diastolic dysfunction. - Aortic valve: Bioprosthetic valve with normal leaflet motion. Peak and mean gradients of 29 and 16 mmHg, respectively. The AT is <100, sharp peaking jet. The DVI is 0.42, suggesting against obstruction. No regurgitation is noted. - Aorta: The aortic root is normal size, the most distally visualized aspect of the ascending aorta is 4.0 cm. The transverse aortic arch measures 3.36 cm. - Mitral valve: Calcified annulus. There was trivial regurgitation. - Left atrium: The atrium was at the upper limits of normal in size. Impressions: -  No significant change compared to echo in 07/2012, AVR gradients are stable, no sign of valve obstruction.  12/10/13 Stress echo: Left ventricular ejection fraction was normal at rest and with stress. There was no echocardiographic evidence for stress-induced ischemia.  10/23/06 Cardiac cath (PRE-AVR): FINAL DIAGNOSES:  1. Congenital heart disease with a bicuspid aortic valve, followed      since age 38.  2. Asymptomatic aortic regurgitation, severe by angiography.  3. Angiographically patent coronary arteries with no evidence of any      significant stenoses.  4. Low-normal to normal LV systolic function.  5. No mitral regurgitation.  6. Normal cardiac outputs. PLAN: Refer for AV replacement and possible aortic root replacement.  02/09/15 Chest CTA: Status post aortic valve root replacement is again noted. Maximal diameters of the ascending aorta at the sinus of Valsalva, sino-tubular junction, and ascending aorta are 4.6 cm, 3.9 cm, and 4.4 cm. Previously, maximal diameter of the ascending aorta was 4.0 cm, therefore the aorta has somewhat dilated since the prior study. The metallic valve remain stable in position. There is no evidence of dissection. IMPRESSION: - Aneurysmal dilatation of the ascending aorta has increased from 4.0 cm to 4.4 cm in maximal caliber. - Stable small anterior mediastinal soft tissue mass supporting benign etiology. Differential diagnosis includes thymic lesion, lymph node, or ectopic thyroid tissue.  Preoperative labs noted. Serum pregnancy test was negative.  Staff message sent to Dr. Aundra Dubin on 12/13/15. He would at least like to get an echo prior to surgery. He is having his staff arrange. I sent Dr. Brantley Stage an update regarding this. Chart will be left for follow-up. Hopefully, echo can be scheduled in the next couple of days.  Myra Gianotti, PA-C [facility]  Short Stay Center/Anesthesiology Phone [phone] 12/14/2015 7:10 PM  Addendum:   12/18/15  Echo: Study Conclusions - Left ventricle: The cavity size was normal. Wall thickness was   increased in a pattern of mild LVH. Systolic function was normal.   The estimated ejection fraction was in the range of 60% to 65%.   Wall motion was normal; there were no regional wall motion   abnormalities. Left ventricular diastolic function parameters   were normal. - Aortic valve: A bioprosthesis was present. There was no   regurgitation. Peak velocity (S): 276 cm/s. Mean gradient (S): 16   mm Hg. - Aorta: Aortic root 38 mm. Ascending aorta ID, A-A, S 32 mm. - Mitral valve: Transvalvular velocity was within the normal range.   There was no evidence for stenosis. There was trivial   regurgitation. - Left atrium: The atrium was mildly to moderately dilated. - Right ventricle: The cavity size was normal. Wall thickness was   normal. Systolic function was normal. - Atrial septum: No defect or patent foramen ovale was identified   by color flow Doppler. - Tricuspid valve: There was trivial regurgitation. - Pulmonary arteries: Systolic pressure was within the normal   range. PA peak pressure: 25 mm Hg (S). Impressions: - Bioprosthetic aortic valve with mildly elevated gradients but   unchanged from 11/2013.  I did send a staff message to Dr. Aundra Dubin earlier today, although since he ordered the echo he should be aware that it has been done. I haven't heard back from him yet (as of 4:00PM), so I reviewed echo findings with anesthesiologist Dr. Orene Desanctis. AV measurements stable. Based on normal ascending thoracic aorta measurements, I'm not sure how much of the TAA was actually visualized on echo. She is due for follow-up TAA radiologic imaging next month. Unless otherwise heard from Dr. Aundra Dubin, he anticipated that patient could proceed as planned. She will be further evaluated by her assigned anesthesiologist on the day of surgery to determine the definitive anesthesia plan.  George Hugh [facility]  Short Stay Center/Anesthesiology Phone [phone] 12/19/2015 4:03 PM

## 2015-12-17 ENCOUNTER — Telehealth: Payer: Self-pay | Admitting: *Deleted

## 2015-12-17 DIAGNOSIS — I359 Nonrheumatic aortic valve disorder, unspecified: Secondary | ICD-10-CM

## 2015-12-17 NOTE — Telephone Encounter (Signed)
Her echo is scheduled for 12/18/15 at [facility].

## 2015-12-17 NOTE — Telephone Encounter (Signed)
I have sent a message to [facility] that pt needs echo prior to surgery scheduled for 12/20/15

## 2015-12-17 NOTE — Telephone Encounter (Signed)
-----   Message from Larey Dresser, MD sent at 12/13/2015  2:40 PM EDT ----- Regarding: RE: OR 12/20/15 Would probably go ahead and get the echo at least.  ----- Message ----- From: Jacinta Shoe, PA-C Sent: 12/13/2015  10:05 AM To: Larey Dresser, MD Subject: OR 12/20/15                                      Dr. Aundra Dubin,  [name] Short is a patient of yours (last seen 02/2015). She is s/p AVR and has a TAA (4.6, 3.9, 4.4 cm by 02/09/15 CTA) that you are following. She is scheduled for lap chole by Dr. Brantley Stage on 12/20/15. Your last note mentions getting a follow-up echo 11/2015 and CTA 01/2016. She remains active, and is going to the gym regularly. No CP, SOB, edema. I wanted to see if you felt any of these tests needed to be done prior to her surgery.  George Hugh [facility] Short Stay Center/Anesthesiology Phone [phone] 12/13/2015 10:10 AM

## 2015-12-18 ENCOUNTER — Other Ambulatory Visit (HOSPITAL_COMMUNITY): Payer: Self-pay

## 2015-12-18 ENCOUNTER — Ambulatory Visit (HOSPITAL_COMMUNITY): Payer: BLUE CROSS/BLUE SHIELD | Attending: Cardiovascular Disease

## 2015-12-18 DIAGNOSIS — I359 Nonrheumatic aortic valve disorder, unspecified: Secondary | ICD-10-CM

## 2015-12-18 DIAGNOSIS — I071 Rheumatic tricuspid insufficiency: Secondary | ICD-10-CM | POA: Insufficient documentation

## 2015-12-18 DIAGNOSIS — Z953 Presence of xenogenic heart valve: Secondary | ICD-10-CM | POA: Diagnosis not present

## 2015-12-18 DIAGNOSIS — I340 Nonrheumatic mitral (valve) insufficiency: Secondary | ICD-10-CM | POA: Diagnosis not present

## 2015-12-18 DIAGNOSIS — I517 Cardiomegaly: Secondary | ICD-10-CM | POA: Insufficient documentation

## 2015-12-19 MED ORDER — CIPROFLOXACIN IN D5W 400 MG/200ML IV SOLN
400.0000 mg | INTRAVENOUS | Status: AC
  Administered 2015-12-20: 400 mg via INTRAVENOUS
  Filled 2015-12-19 (×2): qty 200

## 2015-12-20 ENCOUNTER — Ambulatory Visit (HOSPITAL_COMMUNITY)
Admission: RE | Admit: 2015-12-20 | Discharge: 2015-12-20 | Disposition: A | Discharge status: Home / Self Care | Payer: BLUE CROSS/BLUE SHIELD | Source: Ambulatory Visit | Attending: Surgery | Admitting: Surgery

## 2015-12-20 ENCOUNTER — Encounter (HOSPITAL_COMMUNITY): Payer: Self-pay | Admitting: *Deleted

## 2015-12-20 ENCOUNTER — Ambulatory Visit (HOSPITAL_COMMUNITY): Payer: BLUE CROSS/BLUE SHIELD | Admitting: Vascular Surgery

## 2015-12-20 ENCOUNTER — Ambulatory Visit (HOSPITAL_COMMUNITY): Payer: BLUE CROSS/BLUE SHIELD | Admitting: Certified Registered Nurse Anesthetist

## 2015-12-20 ENCOUNTER — Encounter (HOSPITAL_COMMUNITY)
Admission: RE | Disposition: A | Discharge status: Home / Self Care | Payer: Self-pay | Source: Ambulatory Visit | Attending: Surgery

## 2015-12-20 DIAGNOSIS — Z8042 Family history of malignant neoplasm of prostate: Secondary | ICD-10-CM | POA: Diagnosis not present

## 2015-12-20 DIAGNOSIS — Z800 Family history of malignant neoplasm of digestive organs: Secondary | ICD-10-CM | POA: Insufficient documentation

## 2015-12-20 DIAGNOSIS — Z4009 Encounter for prophylactic removal of other organ: Secondary | ICD-10-CM | POA: Insufficient documentation

## 2015-12-20 DIAGNOSIS — E039 Hypothyroidism, unspecified: Secondary | ICD-10-CM | POA: Diagnosis not present

## 2015-12-20 DIAGNOSIS — K824 Cholesterolosis of gallbladder: Secondary | ICD-10-CM | POA: Diagnosis not present

## 2015-12-20 DIAGNOSIS — K811 Chronic cholecystitis: Secondary | ICD-10-CM | POA: Insufficient documentation

## 2015-12-20 DIAGNOSIS — Z8041 Family history of malignant neoplasm of ovary: Secondary | ICD-10-CM | POA: Diagnosis not present

## 2015-12-20 HISTORY — PX: CHOLECYSTECTOMY: SHX55

## 2015-12-20 SURGERY — LAPAROSCOPIC CHOLECYSTECTOMY WITH INTRAOPERATIVE CHOLANGIOGRAM
Anesthesia: General | Site: Abdomen

## 2015-12-20 MED ORDER — FENTANYL CITRATE (PF) 250 MCG/5ML IJ SOLN
INTRAMUSCULAR | Status: AC
  Filled 2015-12-20: qty 5

## 2015-12-20 MED ORDER — HYDROMORPHONE HCL 1 MG/ML IJ SOLN
INTRAMUSCULAR | Status: AC
  Filled 2015-12-20: qty 1

## 2015-12-20 MED ORDER — HYDROMORPHONE HCL 1 MG/ML IJ SOLN
0.2500 mg | INTRAMUSCULAR | Status: DC | PRN
  Administered 2015-12-20: 0.25 mg via INTRAVENOUS

## 2015-12-20 MED ORDER — MIDAZOLAM HCL 5 MG/5ML IJ SOLN
INTRAMUSCULAR | Status: DC | PRN
  Administered 2015-12-20: 2 mg via INTRAVENOUS

## 2015-12-20 MED ORDER — SUCCINYLCHOLINE CHLORIDE 200 MG/10ML IV SOSY
PREFILLED_SYRINGE | INTRAVENOUS | Status: AC
  Filled 2015-12-20: qty 10

## 2015-12-20 MED ORDER — PROMETHAZINE HCL 25 MG/ML IJ SOLN
6.2500 mg | INTRAMUSCULAR | Status: DC | PRN
  Administered 2015-12-20: 6.25 mg via INTRAVENOUS

## 2015-12-20 MED ORDER — ROCURONIUM BROMIDE 50 MG/5ML IV SOLN
INTRAVENOUS | Status: AC
  Filled 2015-12-20: qty 2

## 2015-12-20 MED ORDER — OXYCODONE-ACETAMINOPHEN 5-325 MG PO TABS: 1.0000 | ORAL_TABLET | ORAL | Status: DC | PRN

## 2015-12-20 MED ORDER — BUPIVACAINE-EPINEPHRINE (PF) 0.25% -1:200000 IJ SOLN
INTRAMUSCULAR | Status: AC
  Filled 2015-12-20: qty 30

## 2015-12-20 MED ORDER — GLYCOPYRROLATE 0.2 MG/ML IJ SOLN
INTRAMUSCULAR | Status: DC | PRN
  Administered 2015-12-20: .4 mg via INTRAVENOUS

## 2015-12-20 MED ORDER — 0.9 % SODIUM CHLORIDE (POUR BTL) OPTIME
TOPICAL | Status: DC | PRN
  Administered 2015-12-20: 1000 mL

## 2015-12-20 MED ORDER — FENTANYL CITRATE (PF) 100 MCG/2ML IJ SOLN
INTRAMUSCULAR | Status: DC | PRN
  Administered 2015-12-20: 50 ug via INTRAVENOUS
  Administered 2015-12-20: 150 ug via INTRAVENOUS
  Administered 2015-12-20: 50 ug via INTRAVENOUS

## 2015-12-20 MED ORDER — DEXAMETHASONE SODIUM PHOSPHATE 10 MG/ML IJ SOLN
INTRAMUSCULAR | Status: AC
  Filled 2015-12-20: qty 1

## 2015-12-20 MED ORDER — PROMETHAZINE HCL 25 MG/ML IJ SOLN
INTRAMUSCULAR | Status: AC
  Filled 2015-12-20: qty 1

## 2015-12-20 MED ORDER — EPHEDRINE 5 MG/ML INJ
INTRAVENOUS | Status: AC
  Filled 2015-12-20: qty 10

## 2015-12-20 MED ORDER — SCOPOLAMINE 1 MG/3DAYS TD PT72
MEDICATED_PATCH | TRANSDERMAL | Status: AC
  Filled 2015-12-20: qty 1

## 2015-12-20 MED ORDER — PROPOFOL 10 MG/ML IV BOLUS
INTRAVENOUS | Status: AC
  Filled 2015-12-20: qty 40

## 2015-12-20 MED ORDER — ONDANSETRON HCL 4 MG/2ML IJ SOLN
INTRAMUSCULAR | Status: AC
  Filled 2015-12-20: qty 2

## 2015-12-20 MED ORDER — NEOSTIGMINE METHYLSULFATE 10 MG/10ML IV SOLN
INTRAVENOUS | Status: DC | PRN
  Administered 2015-12-20: 3 mg via INTRAVENOUS

## 2015-12-20 MED ORDER — ONDANSETRON HCL 4 MG PO TABS: 4.0000 mg | ORAL_TABLET | Freq: Three times a day (TID) | ORAL | 0 refills | Status: DC | PRN

## 2015-12-20 MED ORDER — MIDAZOLAM HCL 2 MG/2ML IJ SOLN
INTRAMUSCULAR | Status: AC
  Filled 2015-12-20: qty 2

## 2015-12-20 MED ORDER — LACTATED RINGERS IV SOLN
INTRAVENOUS | Status: DC | PRN
  Administered 2015-12-20: 07:00:00 via INTRAVENOUS

## 2015-12-20 MED ORDER — SCOPOLAMINE 1 MG/3DAYS TD PT72
MEDICATED_PATCH | TRANSDERMAL | Status: DC | PRN
  Administered 2015-12-20: 1 via TRANSDERMAL

## 2015-12-20 MED ORDER — SODIUM CHLORIDE 0.9 % IR SOLN
Status: DC | PRN
  Administered 2015-12-20: 1000 mL

## 2015-12-20 MED ORDER — LIDOCAINE HCL (CARDIAC) 20 MG/ML IV SOLN
INTRAVENOUS | Status: DC | PRN
  Administered 2015-12-20: 60 mg via INTRAVENOUS

## 2015-12-20 MED ORDER — ROCURONIUM BROMIDE 100 MG/10ML IV SOLN
INTRAVENOUS | Status: DC | PRN
  Administered 2015-12-20: 40 mg via INTRAVENOUS

## 2015-12-20 MED ORDER — CHLORHEXIDINE GLUCONATE 4 % EX LIQD: 1.0000 "application " | Freq: Once | CUTANEOUS | Status: DC

## 2015-12-20 MED ORDER — OXYCODONE-ACETAMINOPHEN 5-325 MG PO TABS: 1.0000 | ORAL_TABLET | ORAL | 0 refills | Status: DC | PRN

## 2015-12-20 MED ORDER — ONDANSETRON HCL 4 MG/2ML IJ SOLN
INTRAMUSCULAR | Status: DC | PRN
  Administered 2015-12-20: 4 mg via INTRAVENOUS

## 2015-12-20 MED ORDER — PROPOFOL 10 MG/ML IV BOLUS
INTRAVENOUS | Status: DC | PRN
  Administered 2015-12-20: 150 mg via INTRAVENOUS

## 2015-12-20 MED ORDER — LIDOCAINE 2% (20 MG/ML) 5 ML SYRINGE
INTRAMUSCULAR | Status: AC
  Filled 2015-12-20: qty 5

## 2015-12-20 MED ORDER — DEXAMETHASONE SODIUM PHOSPHATE 10 MG/ML IJ SOLN
INTRAMUSCULAR | Status: DC | PRN
Start: 2015-12-20 — End: 2015-12-20
  Administered 2015-12-20: 5 mg via INTRAVENOUS

## 2015-12-20 MED ORDER — BUPIVACAINE-EPINEPHRINE 0.25% -1:200000 IJ SOLN
INTRAMUSCULAR | Status: DC | PRN
  Administered 2015-12-20: 5 mL

## 2015-12-20 MED ORDER — IOPAMIDOL (ISOVUE-300) INJECTION 61%
INTRAVENOUS | Status: AC
  Filled 2015-12-20: qty 50

## 2015-12-20 MED ORDER — PHENYLEPHRINE 40 MCG/ML (10ML) SYRINGE FOR IV PUSH (FOR BLOOD PRESSURE SUPPORT)
PREFILLED_SYRINGE | INTRAVENOUS | Status: AC
  Filled 2015-12-20: qty 10

## 2015-12-20 MED ORDER — PHENYLEPHRINE HCL 10 MG/ML IJ SOLN
INTRAMUSCULAR | Status: DC | PRN
  Administered 2015-12-20 (×2): 40 ug via INTRAVENOUS

## 2015-12-20 SURGICAL SUPPLY — 43 items
APPLIER CLIP ROT 10 11.4 M/L (STAPLE) ×2
APR CLP MED LRG 11.4X10 (STAPLE) ×1
BAG SPEC RTRVL LRG 6X4 10 (ENDOMECHANICALS) ×1
BLADE SURG ROTATE 9660 (MISCELLANEOUS) IMPLANT
CANISTER SUCTION 2500CC (MISCELLANEOUS) ×2 IMPLANT
CHLORAPREP W/TINT 26ML (MISCELLANEOUS) ×2 IMPLANT
CLIP APPLIE ROT 10 11.4 M/L (STAPLE) ×1 IMPLANT
COVER MAYO STAND STRL (DRAPES) ×2 IMPLANT
COVER SURGICAL LIGHT HANDLE (MISCELLANEOUS) ×2 IMPLANT
DRAPE C-ARM 42X72 X-RAY (DRAPES) ×2 IMPLANT
DRAPE WARM FLUID 44X44 (DRAPE) ×2 IMPLANT
ELECT REM PT RETURN 9FT ADLT (ELECTROSURGICAL) ×2
ELECTRODE REM PT RTRN 9FT ADLT (ELECTROSURGICAL) ×1 IMPLANT
GLOVE BIO SURGEON STRL SZ8 (GLOVE) ×3 IMPLANT
GLOVE BIOGEL PI IND STRL 6.5 (GLOVE) IMPLANT
GLOVE BIOGEL PI IND STRL 8 (GLOVE) ×1 IMPLANT
GLOVE BIOGEL PI INDICATOR 6.5 (GLOVE) ×1
GLOVE BIOGEL PI INDICATOR 8 (GLOVE) ×3
GLOVE ECLIPSE 8.0 STRL XLNG CF (GLOVE) ×1 IMPLANT
GLOVE SURG SS PI 7.5 STRL IVOR (GLOVE) ×1 IMPLANT
GOWN STRL REUS W/ TWL LRG LVL3 (GOWN DISPOSABLE) ×2 IMPLANT
GOWN STRL REUS W/ TWL XL LVL3 (GOWN DISPOSABLE) ×1 IMPLANT
GOWN STRL REUS W/TWL LRG LVL3 (GOWN DISPOSABLE) ×4
GOWN STRL REUS W/TWL XL LVL3 (GOWN DISPOSABLE) ×2
KIT BASIN OR (CUSTOM PROCEDURE TRAY) ×2 IMPLANT
KIT ROOM TURNOVER OR (KITS) ×2 IMPLANT
LIQUID BAND (GAUZE/BANDAGES/DRESSINGS) ×2 IMPLANT
NS IRRIG 1000ML POUR BTL (IV SOLUTION) ×2 IMPLANT
PAD ARMBOARD 7.5X6 YLW CONV (MISCELLANEOUS) ×2 IMPLANT
POUCH SPECIMEN RETRIEVAL 10MM (ENDOMECHANICALS) ×2 IMPLANT
SCISSORS LAP 5X35 DISP (ENDOMECHANICALS) ×2 IMPLANT
SET CHOLANGIOGRAPH 5 50 .035 (SET/KITS/TRAYS/PACK) ×2 IMPLANT
SET IRRIG TUBING LAPAROSCOPIC (IRRIGATION / IRRIGATOR) ×2 IMPLANT
SLEEVE ENDOPATH XCEL 5M (ENDOMECHANICALS) ×3 IMPLANT
SPECIMEN JAR SMALL (MISCELLANEOUS) ×2 IMPLANT
SUT MNCRL AB 4-0 PS2 18 (SUTURE) ×2 IMPLANT
TOWEL OR 17X24 6PK STRL BLUE (TOWEL DISPOSABLE) ×2 IMPLANT
TOWEL OR 17X26 10 PK STRL BLUE (TOWEL DISPOSABLE) ×2 IMPLANT
TRAY LAPAROSCOPIC MC (CUSTOM PROCEDURE TRAY) ×2 IMPLANT
TROCAR XCEL BLUNT TIP 100MML (ENDOMECHANICALS) ×2 IMPLANT
TROCAR XCEL NON-BLD 11X100MML (ENDOMECHANICALS) ×2 IMPLANT
TROCAR XCEL NON-BLD 5MMX100MML (ENDOMECHANICALS) ×2 IMPLANT
TUBING INSUFFLATION (TUBING) ×2 IMPLANT

## 2015-12-20 NOTE — Interval H&P Note (Signed)
History and Physical Interval Note:  12/20/2015 7:06 AM  [name] Short  has presented today for surgery, with the diagnosis of family history of gallbladder cancer  The various methods of treatment have been discussed with the patient and family. After consideration of risks, benefits and other options for treatment, the patient has consented to  Procedure(s): LAPAROSCOPIC CHOLECYSTECTOMY WITH INTRAOPERATIVE CHOLANGIOGRAM (N/A) as a surgical intervention .  The patient's history has been reviewed, patient examined, no change in status, stable for surgery.  I have reviewed the patient's chart and labs.  Questions were answered to the patient's satisfaction.     [name] Short A.

## 2015-12-20 NOTE — Op Note (Signed)
Laparoscopic Cholecystectomy  Procedure Note  Indications: This patient presents with family history of gallbladder disease/ cancer  and will undergo laparoscopic cholecystectomy. She was counseled on risk of surgery and low risk of gallbladder cancer in general.  She desired removal due to strong family history of gallbladder cancer at young age.The procedure has been discussed with the patient. Operative and non operative treatments have been discussed. Risks of surgery include bleeding, infection,  Common bile duct injury,  Injury to the stomach,liver, colon,small intestine, abdominal wall,  Diaphragm,  Major blood vessels,  And the need for an open procedure.  Other risks include worsening of medical problems, death,  DVT and pulmonary embolism, and cardiovascular events.   Medical options have also been discussed. The patient has been informed of long term expectations of surgery and non surgical options,  The patient agrees to proceed.    Pre-operative Diagnosis: family history of gallbladder cancer   Post-operative Diagnosis: Same  Surgeon: Finneus Kaneshiro A.   Assistants: Dr Johney Maine MD   Anesthesia: General endotracheal anesthesia and Local anesthesia 0.25.% bupivacaine, with epinephrine  ASA Class: 2  Procedure Details  The patient was seen again in the Holding Room. The risks, benefits, complications, treatment options, and expected outcomes were discussed with the patient. The possibilities of reaction to medication, pulmonary aspiration, perforation of viscus, bleeding, recurrent infection, finding a normal gallbladder, the need for additional procedures, failure to diagnose a condition, the possible need to convert to an open procedure, and creating a complication requiring transfusion or operation were discussed with the patient. The patient and/or family concurred with the proposed plan, giving informed consent. The site of surgery properly noted/marked. The patient was taken to  Operating Room, identified as [name] Short and the procedure verified as Laparoscopic Cholecystectomy with Intraoperative Cholangiograms. A Time Out was held and the above information confirmed.  Prior to the induction of general anesthesia, antibiotic prophylaxis was administered. General endotracheal anesthesia was then administered and tolerated well. After the induction, the abdomen was prepped in the usual sterile fashion. The patient was positioned in the supine position with the left arm comfortably tucked, along with some reverse Trendelenburg.  Local anesthetic agent was injected into the skin near the umbilicus and an incision made. The midline fascia was incised and the Hasson technique was used to introduce a 12 mm port under direct vision. It was secured with a figure of eight Vicryl suture placed in the usual fashion. Pneumoperitoneum was then created with CO2 and tolerated well without any adverse changes in the patient's vital signs. Additional trocars were introduced under direct vision with an 11 mm trocar in the epigastrium and 2 5 mm trocars in the right upper quadrant. All skin incisions were infiltrated with a local anesthetic agent before making the incision and placing the trocars.   The gallbladder was identified, the fundus grasped and retracted cephalad. Adhesions were lysed bluntly and with the electrocautery where indicated, taking care not to injure any adjacent organs or viscus. The infundibulum was grasped and retracted laterally, exposing the peritoneum overlying the triangle of Calot. This was then divided and exposed in a blunt fashion. The cystic duct was clearly identified and bluntly dissected circumferentially. The junctions of the gallbladder, cystic duct and common bile duct were clearly identified prior to the division of any linear structure.   The CBD and cystic duct were visualized well.  No cholangiogram was performed since preoperative U/S was normal and the  anatomy clearly visualized.  The cystic  duct was then  ligated with surgical clips  on the patient side and  clipped on the gallbladder side and divided. The cystic artery was identified, dissected free, ligated with clips and divided as well. Posterior cystic artery clipped and divided.  The gallbladder was dissected from the liver bed in retrograde fashion with the electrocautery. The gallbladder was removed. The liver bed was irrigated and inspected. Hemostasis was achieved with the electrocautery. Copious irrigation was utilized and was repeatedly aspirated until clear all particulate matter. Hemostasis was achieved with no signs  Of bleeding or bile leakage.  Pneumoperitoneum was completely reduced after viewing removal of the trocars under direct vision. The wound was thoroughly irrigated and the fascia was then closed with a figure of eight suture; the skin was then closed with 4 O monocryl  and a sterile dressing was applied of liquid adhesive.  Instrument, sponge, and needle counts were correct at closure and at the conclusion of the case.   Findings: Normal appearing gallbladder   Estimated Blood Loss: Minimal         Drains: none          Total IV Fluids: 600 mL         Specimens: Gallbladder           Complications: None; patient tolerated the procedure well.         Disposition: PACU - hemodynamically stable.         Condition: stable

## 2015-12-20 NOTE — H&P (Signed)
H&P Encounter Date:  [name] Luna, MD  Surgery    [name] Short 10/08/2015 1:45 PM Location: [facility] Patient #: [id number] DOB: May 20, 1970 Married / Language: [name] Short / Race: White Female  History of Present Illness [name] Short A. [name] Boddy MD; 2:43 PM) Patient words: GB       Patient presents for evaluation for cholecystectomy secondary to strong family history of gallbladder cancer, prostate cancer and ovarian cancer. The patient's mother died of metastatic gallbladder cancer at the age of 74 as did her grandmother. She denies abdominal pain nausea or vomiting. She has never had an ultrasound of her gallbladder. She has never had genetic screening.  The patient is a 45 year old female.   Other Problems [name] Short, CMA;  1:46 PM) Anxiety Disorder Heart murmur Hemorrhoids Other disease, cancer, significant illness Thyroid Disease  Past Surgical History [name] Short, CMA;  1:46 PM) Aneurysm Repair Cesarean Section - 1 Thyroid Surgery Valve Replacement  Diagnostic Studies History [name] Short, [place]; 10/08/2015 1:46 PM) Colonoscopy never Mammogram within last year Pap Smear 1-5 years ago  Allergies [name] Short, [place];  Amoxicillin *PENICILLINS*  Medication History [name] Short, CMA;  PM) Levothyroxine Sodium (25MCG Tablet, Oral) Active. Medications Reconciled  Social History [name] Short, [place];  Alcohol use Moderate alcohol use. Caffeine use Coffee. No drug use Tobacco use Never smoker.  Family History [name] Short, [place];  Alcohol Abuse Father. Anesthetic complications Father. Cancer Mother. Depression Brother, Sister. Heart Disease Father. Heart disease in female family member before age 46 Hypertension Mother. Melanoma Father. Migraine Headache Sister. Prostate Cancer Father. Thyroid problems Sister.  Pregnancy / Birth History [name] Short, [place];  10/08/2015 1:46 PM) Age at menarche 54 years. Contraceptive History Oral contraceptives. Gravida 2 Irregular periods Maternal age 35-20 Para 1     Review of Systems [name] Short CMA;) General Present- Night Sweats. Not Present- Appetite Loss, Chills, Fatigue, Fever, Weight Gain and Weight Loss. Skin Not Present- Change in Wart/Mole, Dryness, Hives, Jaundice, New Lesions, Non-Healing Wounds, Rash and Ulcer. HEENT Present- Seasonal Allergies and Wears glasses/contact lenses. Not Present- Earache, Hearing Loss, Hoarseness, Nose Bleed, Oral Ulcers, Ringing in the Ears, Sinus Pain, Sore Throat, Visual Disturbances and Yellow Eyes. Respiratory Not Present- Bloody sputum, Chronic Cough, Difficulty Breathing, Snoring and Wheezing. Breast Not Present- Breast Mass, Breast Pain, Nipple Discharge and Skin Changes. Cardiovascular Not Present- Chest Pain, Difficulty Breathing Lying Down, Leg Cramps, Palpitations, Rapid Heart Rate, Shortness of Breath and Swelling of Extremities. Gastrointestinal Not Present- Abdominal Pain, Bloating, Bloody Stool, Change in Bowel Habits, Chronic diarrhea, Constipation, Difficulty Swallowing, Excessive gas, Gets full quickly at meals, Hemorrhoids, Indigestion, Nausea, Rectal Pain and Vomiting. Female Genitourinary Not Present- Frequency, Nocturia, Painful Urination, Pelvic Pain and Urgency. Musculoskeletal Not Present- Back Pain, Joint Pain, Joint Stiffness, Muscle Pain, Muscle Weakness and Swelling of Extremities. Neurological Not Present- Decreased Memory, Fainting, Headaches, Numbness, Seizures, Tingling, Tremor, Trouble walking and Weakness. Psychiatric Not Present- Anxiety, Bipolar, Change in Sleep Pattern, Depression, Fearful and Frequent crying. Endocrine Not Present- Cold Intolerance, Excessive Hunger, Hair Changes, Heat Intolerance, Hot flashes and New Diabetes. Hematology Not Present- Easy Bruising, Excessive bleeding, Gland problems, HIV and  Persistent Infections.  Vitals [employer] R. Short CMA; 10/08/2015 1:46 PM) 10/08/2015 1:46 PM Weight: 183.25 lb Height: 67in Body Surface Area: 1.95 m Body Mass Index: 28.7 kg/m  BP: 114/76 (Sitting, Left Arm, Standard)      Physical Exam (Briggett Tuccillo A. Kody Vigil MD;   General Mental  Status-Alert. General Appearance-Consistent with stated age. Hydration-Well hydrated. Voice-Normal.  Head and Neck Head-normocephalic, atraumatic with no lesions or palpable masses.  Eye Eyeball - Bilateral-Extraocular movements intact. Sclera/Conjunctiva - Bilateral-No scleral icterus.  Chest and Lung Exam Chest and lung exam reveals -quiet, even and easy respiratory effort with no use of accessory muscles and on auscultation, normal breath sounds, no adventitious sounds and normal vocal resonance. Inspection Chest Wall - Normal. Back - normal.  Cardiovascular Cardiovascular examination reveals -on palpation PMI is normal in location and amplitude, no palpable S3 or S4. Normal cardiac borders., normal heart sounds, regular rate and rhythm with no murmurs, carotid auscultation reveals no bruits and normal pedal pulses bilaterally.  Abdomen Inspection Inspection of the abdomen reveals - No Hernias. Skin - Scar - no surgical scars. Palpation/Percussion Palpation and Percussion of the abdomen reveal - Soft, Non Tender, No Rebound tenderness, No Rigidity (guarding) and No hepatosplenomegaly. Auscultation Auscultation of the abdomen reveals - Bowel sounds normal.  Neurologic Neurologic evaluation reveals -alert and oriented x 3 with no impairment of recent or remote memory. Mental Status-Normal.  Musculoskeletal Normal Exam - Left-Upper Extremity Strength Normal and Lower Extremity Strength Normal. Normal Exam - Right-Upper Extremity Strength Normal, Lower Extremity Weakness.    Assessment & Plan ([name] A. [name] MD;    FAMILY HISTORY OF  CANCER OF GALLBLADDER (Z80.0) Impression: Discuss surgical options. Strong family history of gallbladder, prostate and ovarian cancer. Recommend genetic screening. She is interested in cholecystectomy since she had first degree relatives died of gallbladder cancer in their 6s. Discussed the pros and cons of this as well as potential complications. Recommend ultrasound of gallbladder preop. After discussion patient desires cholecystectomy. Discussed laparoscopic cholecystectomy with the pros and cons of surgery as well as nonsurgical management. Reviewed surgical complications of the procedure with her and long-term expectations afterwards. The procedure has been discussed with the patient. Risks of laparoscopic cholecystectomy include bleeding, infection, bile duct injury, leak, death, open surgery, diarrhea, other surgery, organ injury, blood vessel injury, DVT, and additional care.  FAMILY HISTORY OF PROSTATE CANCER (Z80.42)  FAMILY HISTORY OF OVARIAN CANCER (Z80.41)  Current Plans Pt Education - [facility] Laparoscopic Surgery [facility]CI You are being scheduled for surgery - Our schedulers will call you.  You should hear from our office's scheduling department within 5 working days about the location, date, and time of surgery. We try to make accommodations for patient's preferences in scheduling surgery, but sometimes the OR schedule or the surgeon's schedule prevents [place] from making those accommodations.  If you have not heard from our office [phone]) in 5 working days, call the office and ask for your surgeon's nurse.  If you have other questions about your diagnosis, plan, or surgery, call the office and ask for your surgeon's nurse.  The anatomy & physiology of hepatobiliary & pancreatic function was discussed. The pathophysiology of gallbladder dysfunction was discussed. Natural history risks without surgery was discussed. I feel the risks of no intervention will lead to serious problems  that outweigh the operative risks; therefore, I recommended cholecystectomy to remove the pathology. I explained laparoscopic techniques with possible need for an open approach. Probable cholangiogram to evaluate the bilary tract was explained as well.  Risks such as bleeding, infection, abscess, leak, injury to other organs, need for further treatment, heart attack, death, and other risks were discussed. I noted a good likelihood this will help address the problem. Possibility that this will not correct all abdominal symptoms was explained. Goals of post-operative recovery  were discussed as well. We will work to minimize complications. An educational handout further explaining the pathology and treatment options was given as well. Questions were answered. The patient expresses understanding & wishes to proceed with surgery.  Pt Education - [facility] Laparosopic Post Op HCI ([place]) Pt Education - Laparoscopic Cholecystectomy: gallbladder       Pre-op/Pre-procedure Orders on 10/08/2015        Routing History        Detailed Report

## 2015-12-20 NOTE — Anesthesia Preprocedure Evaluation (Addendum)
Anesthesia Evaluation  Patient identified by MRN, date of birth, ID band Patient awake    History of Anesthesia Complications (+) PONV  Airway Mallampati: II  TM Distance: >3 FB Neck ROM: Full    Dental  (+) Teeth Intact, Dental Advisory Given   Pulmonary    breath sounds clear to auscultation       Cardiovascular + Peripheral Vascular Disease  + Valvular Problems/Murmurs  Rhythm:Regular Rate:Normal     Neuro/Psych    GI/Hepatic Neg liver ROS, History noted. CE   Endo/Other  Hypothyroidism   Renal/GU negative Renal ROS     Musculoskeletal   Abdominal   Peds  Hematology   Anesthesia Other Findings   Reproductive/Obstetrics                            Anesthesia Physical Anesthesia Plan  ASA: III  Anesthesia Plan: General   Post-op Pain Management:    Induction: Intravenous  Airway Management Planned: Oral ETT  Additional Equipment:   Intra-op Plan:   Post-operative Plan: Extubation in OR  Informed Consent: I have reviewed the patients History and Physical, chart, labs and discussed the procedure including the risks, benefits and alternatives for the proposed anesthesia with the patient or authorized representative who has indicated his/her understanding and acceptance.   Dental advisory given  Plan Discussed with: CRNA and Anesthesiologist  Anesthesia Plan Comments:         Anesthesia Quick Evaluation

## 2015-12-20 NOTE — Progress Notes (Signed)
Pt has very small red rash around each incision site. Dr. Brantley Stage aware with no new orders. Will cont to monitor.

## 2015-12-20 NOTE — Anesthesia Postprocedure Evaluation (Signed)
Anesthesia Post Note  Patient: [name] Short  Procedure(s) Performed: Procedure(s) (LRB): LAPAROSCOPIC CHOLECYSTECTOMY WITH INTRAOPERATIVE CHOLANGIOGRAM (N/A)  Patient location during evaluation: PACU Anesthesia Type: General Level of consciousness: awake Vital Signs Assessment: post-procedure vital signs reviewed and stable    Last Vitals:  Vitals:   12/20/15 0605 12/20/15 0840  BP: 121/87 101/67  Pulse: 85 61  Resp: 18 12  Temp: 36.9 C 36.2 C    Last Pain:  Vitals:   12/20/15 0858  TempSrc:   PainSc: 2                  EDWARDS,Mats Jeanlouis

## 2015-12-20 NOTE — Transfer of Care (Signed)
Immediate Anesthesia Transfer of Care Note  Patient: [name] Short  Procedure(s) Performed: Procedure(s): LAPAROSCOPIC CHOLECYSTECTOMY WITH INTRAOPERATIVE CHOLANGIOGRAM (N/A)  Patient Location: PACU  Anesthesia Type:General  Level of Consciousness: awake, alert , oriented, patient cooperative and responds to stimulation  Airway & Oxygen Therapy: Patient Spontanous Breathing and Patient connected to face mask oxygen  Post-op Assessment: Report given to RN, Post -op Vital signs reviewed and stable and Patient moving all extremities X 4  Post vital signs: Reviewed and stable  Last Vitals:  Vitals:   12/20/15 0605  BP: 121/87  Pulse: 85  Resp: 18  Temp: 36.9 C    Last Pain:  Vitals:   12/20/15 0605  TempSrc: Oral         Complications: No apparent anesthesia complications

## 2015-12-20 NOTE — Anesthesia Procedure Notes (Signed)
Procedure Name: Intubation Date/Time: 12/20/2015 7:38 AM Performed by: Tressia Miners LEFFEW Pre-anesthesia Checklist: Patient identified, Patient being monitored, Timeout performed, Emergency Drugs available and Suction available Patient Re-evaluated:Patient Re-evaluated prior to inductionOxygen Delivery Method: Circle System Utilized Preoxygenation: Pre-oxygenation with 100% oxygen Intubation Type: IV induction Ventilation: Mask ventilation without difficulty Laryngoscope Size: Mac and 3 Grade View: Grade I Tube type: Oral Tube size: 7.0 mm Number of attempts: 1 Airway Equipment and Method: Stylet Placement Confirmation: ETT inserted through vocal cords under direct vision,  positive ETCO2 and breath sounds checked- equal and bilateral Secured at: 22 cm Tube secured with: Tape Dental Injury: Teeth and Oropharynx as per pre-operative assessment

## 2015-12-20 NOTE — Discharge Instructions (Signed)
[facility] ______[facility] °LAPAROSCOPIC SURGERY: POST OP INSTRUCTIONS °Always review your discharge instruction sheet given to you by the facility where your surgery was performed. °IF YOU HAVE DISABILITY OR FAMILY LEAVE FORMS, YOU MUST BRING THEM TO THE OFFICE FOR PROCESSING.   °DO NOT GIVE THEM TO YOUR DOCTOR. ° °1. A prescription for pain medication may be given to you upon discharge.  Take your pain medication as prescribed, if needed.  If narcotic pain medicine is not needed, then you may take acetaminophen (Tylenol) or ibuprofen (Advil) as needed. °2. Take your usually prescribed medications unless otherwise directed. °3. If you need a refill on your pain medication, please contact your pharmacy.  They will contact our office to request authorization. Prescriptions will not be filled after 5pm or on week-ends. °4. You should follow a light diet the first few days after arrival home, such as soup and crackers, etc.  Be sure to include lots of fluids daily. °5. Most patients will experience some swelling and bruising in the area of the incisions.  Ice packs will help.  Swelling and bruising can take several days to resolve.  °6. It is common to experience some constipation if taking pain medication after surgery.  Increasing fluid intake and taking a stool softener (such as Colace) will usually help or prevent this problem from occurring.  A mild laxative (Milk of Magnesia or Miralax) should be taken according to package instructions if there are no bowel movements after 48 hours. °7. Unless discharge instructions indicate otherwise, you may remove your bandages 24-48 hours after surgery, and you may shower at that time.  You may have steri-strips (small skin tapes) in place directly over the incision.  These strips should be left on the skin for 7-10 days.  If your surgeon used skin glue on the incision, you may shower in 24 hours.  The glue will flake off over the next 2-3 weeks.  Any sutures or  staples will be removed at the office during your follow-up visit. °8. ACTIVITIES:  You may resume regular (light) daily activities beginning the next day--such as daily self-care, walking, climbing stairs--gradually increasing activities as tolerated.  You may have sexual intercourse when it is comfortable.  Refrain from any heavy lifting or straining until approved by your doctor. °a. You may drive when you are no longer taking prescription pain medication, you can comfortably wear a seatbelt, and you can safely maneuver your car and apply brakes. °b. RETURN TO WORK:  __________________________________________________________ °9. You should see your doctor in the office for a follow-up appointment approximately 2-3 weeks after your surgery.  Make sure that you call for this appointment within a day or two after you arrive home to insure a convenient appointment time. °10. OTHER INSTRUCTIONS: __________________________________________________________________________________________________________________________ __________________________________________________________________________________________________________________________ °WHEN TO CALL YOUR DOCTOR: °1. Fever over 101.0 °2. Inability to urinate °3. Continued bleeding from incision. °4. Increased pain, redness, or drainage from the incision. °5. Increasing abdominal pain ° °The clinic staff is available to answer your questions during regular business hours.  Please don’t hesitate to call and ask to speak to one of the nurses for clinical concerns.  If you have a medical emergency, go to the nearest emergency room or call 911.  A surgeon from [facility] is always on call at the hospital. °[address], [address], [place], [place]  [address] ? [address] [address], [place], [place]   [address] °[phone] ? [phone] ? FAX [phone] °Web site:   [email or web address] °

## 2015-12-21 ENCOUNTER — Encounter (HOSPITAL_COMMUNITY): Payer: Self-pay | Admitting: Surgery

## 2016-01-23 DIAGNOSIS — H33322 Round hole, left eye: Secondary | ICD-10-CM | POA: Diagnosis not present

## 2016-01-25 ENCOUNTER — Telehealth: Payer: Self-pay | Admitting: Cardiology

## 2016-01-25 MED ORDER — DIAZEPAM 5 MG PO TABS: ORAL_TABLET | ORAL | 0 refills | Status: DC

## 2016-01-25 NOTE — Telephone Encounter (Signed)
Spoke with pt and informed her of new order for Valium.  Pt verbalized understanding and was appreciative for assistance.  Pt will come by to pick prescription up later today or Monday.

## 2016-01-25 NOTE — Telephone Encounter (Signed)
New message    Pt verbalized that she is calling to request some vallum for the following appt:  01/29/2016     1:00 PM     MR/MRA CHEST/ABD/PEL W/O & W/ BD:[id number]

## 2016-01-25 NOTE — Telephone Encounter (Signed)
Left message to call back  

## 2016-01-25 NOTE — Telephone Encounter (Signed)
Can have valium 2.5 mg po x 1 for MRA

## 2016-01-29 ENCOUNTER — Ambulatory Visit (HOSPITAL_COMMUNITY)
Admission: RE | Admit: 2016-01-29 | Discharge: 2016-01-29 | Disposition: A | Discharge status: Home / Self Care | Payer: BLUE CROSS/BLUE SHIELD | Source: Ambulatory Visit | Attending: Cardiology | Admitting: Cardiology

## 2016-01-29 ENCOUNTER — Encounter (HOSPITAL_COMMUNITY): Payer: Self-pay

## 2016-01-29 DIAGNOSIS — Z952 Presence of prosthetic heart valve: Secondary | ICD-10-CM

## 2016-01-29 DIAGNOSIS — I7121 Aneurysm of the ascending aorta, without rupture: Secondary | ICD-10-CM

## 2016-01-29 DIAGNOSIS — Z954 Presence of other heart-valve replacement: Secondary | ICD-10-CM | POA: Diagnosis not present

## 2016-01-29 DIAGNOSIS — I712 Thoracic aortic aneurysm, without rupture: Secondary | ICD-10-CM | POA: Diagnosis not present

## 2016-01-29 MED ORDER — GADOBENATE DIMEGLUMINE 529 MG/ML IV SOLN
17.0000 mL | Freq: Once | INTRAVENOUS | Status: AC | PRN
  Administered 2016-01-29: 17 mL via INTRAVENOUS

## 2016-01-31 ENCOUNTER — Telehealth: Payer: Self-pay | Admitting: *Deleted

## 2016-01-31 NOTE — Telephone Encounter (Signed)
I spoke with pt and reviewed MRA results with her. She states she is currently taking a synthetic thyroid replacement and her endocrinologist (Dr. Debbora Presto) is considering placing her on a real animal hormone replacement. The concern is this may increase her heart rate and pt is requesting cardiology input on this.  Pt is seeing Dr. Aundra Dubin on October 25,2017 and will discuss at this appt but wanted to pass on information for review prior to appt.  I have asked pt to contact endocrinology and request last office note be faxed to our office.

## 2016-01-31 NOTE — Telephone Encounter (Signed)
I spoke with pt and gave her information from Dr. Aundra Dubin

## 2016-01-31 NOTE — Telephone Encounter (Signed)
I think it would be ok, unlikely to raise HR that much.

## 2016-02-06 DIAGNOSIS — E890 Postprocedural hypothyroidism: Secondary | ICD-10-CM | POA: Diagnosis not present

## 2016-02-08 DIAGNOSIS — H33322 Round hole, left eye: Secondary | ICD-10-CM | POA: Diagnosis not present

## 2016-03-12 ENCOUNTER — Encounter: Payer: Self-pay | Admitting: Cardiology

## 2016-03-12 ENCOUNTER — Ambulatory Visit (INDEPENDENT_AMBULATORY_CARE_PROVIDER_SITE_OTHER): Payer: BLUE CROSS/BLUE SHIELD | Admitting: Cardiology

## 2016-03-12 VITALS — BP 130/76 | HR 84 | Ht 67.0 in | Wt 183.8 lb

## 2016-03-12 DIAGNOSIS — I712 Thoracic aortic aneurysm, without rupture: Secondary | ICD-10-CM

## 2016-03-12 DIAGNOSIS — Z952 Presence of prosthetic heart valve: Secondary | ICD-10-CM | POA: Diagnosis not present

## 2016-03-12 DIAGNOSIS — I7121 Aneurysm of the ascending aorta, without rupture: Secondary | ICD-10-CM

## 2016-03-12 LAB — LIPID PANEL
CHOL/HDL RATIO: 4.3 ratio (ref <=5.0)
CHOLESTEROL: 208 mg/dL — AB (ref 125–200)
HDL: 48 mg/dL (ref >=46)
LDL Cholesterol: 130 mg/dL — ABNORMAL HIGH (ref <130)
Triglycerides: 151 mg/dL — ABNORMAL HIGH (ref <150)
VLDL: 30 mg/dL (ref <30)

## 2016-03-12 NOTE — Patient Instructions (Signed)
Medication Instructions:  Start aspirin 81mg  daily  Labwork: Lipid profile today  Testing/Procedures: MRA of your chest in September 2018.  Follow-Up: Your physician wants you to follow-up in: 1 year with End. (October 2018).  You will receive a reminder letter in the mail two months in advance. If you don't receive a letter, please call our office to schedule the follow-up appointment.        If you need a refill on your cardiac medications before your next appointment, please call your pharmacy.

## 2016-03-13 NOTE — Progress Notes (Signed)
Patient ID: [name] Short, female   DOB: Jan 13, 1971, 45 y.o.   MRN: [id number] Referring MD: Dr. Helane Short  45 yo with history of bicuspid aortic valve disorder developed severe aortic insufficiency in 2008.  She additionally was found to have an ascending aortic aneurysm.  She had AVR + supracoronary ascending aorta replacement in 2008.  She had a bioprosthetic aortic valve implanted since she wanted to have a child.  Last echo in 8/17 showed normal EF with stable bioprosthetic aortic valve.  MRA chest in 9/17 showed stable repaired proximal ascending aorta with 4.3 cm distal ascending aorta.    In general, she has been doing quite well lately.  She is reasonably active and exercises regularly.  She kick-boxes.  No exertional dyspnea, no chest pain. No palpitations or syncopal episodes.    ECG: NSR, normal  Labs (8/13): K 4.2, creatinine 0.75 Labs (11/15): LDL 136, HDL 43 Labs (10/16): LDL 119, HDL 44  PMH: 1. H/o goiter with thyroid surgery in 8/13.  2. H/o uterine fibroids 3. Bicuspid aortic valve disorder: Developed severe aortic insufficiency.  She had AVR in 2008 with a 25 mm bioprosthetic aortic valve (bioprosthetic because she wanted to get pregnant).  She additionally had a supracoronary ascending aorta replacement for associated ascending aortic aneurysm.  MRA chest (2/14) proximal ascending aorta with stable repair, distal ascending aorta measured 4.0 cm).  Echo (7/15) with EF 55-60%, bioprosthetic aortic valve with mean gradient 16 mmHg, no significant AI, distal ascending aorta 4.0 cm.  MRA chest 9/16 with 4.4 cm ascending aorta.  - Echo (8/17): EF 60-65%, bioprosthetic aortic valve with mean gradient 16 mmHg, trivial MR, PASP 25 mmHg. - MRA chest (9/17) with stable 4.3 cm dilation distal ascending aorta.  4. LHC (2008, pre-surgery) with no coronary artery disease, EF 60% on LV-gram.  Stress echo 7/15 normal.  5. S/p cholecystectomy.   SH: Married, has 1 child.  She formerly worked in a  preschool.  Nonsmoker.   FH: Father with bicuspid aortic valve, also s/p AVR.    ROS: All systems reviewed and negative except as per HPI.   Current Outpatient Prescriptions  Medication Sig Dispense Refill  . diazepam (VALIUM) 5 MG tablet Take a one time dose of 2.5mg  orally prior to procedure 1 tablet 0  . NP THYROID 15 MG tablet Take 15 mg by mouth daily.  1  . aspirin EC 81 MG tablet Take 1 tablet (81 mg total) by mouth daily.     No current facility-administered medications for this visit.    BP 130/76   Pulse 84   Ht 5\' 7"  (1.702 m)   Wt 183 lb 12.8 oz (83.4 kg)   SpO2 98%   BMI 28.79 kg/m  General: NAD Neck: No JVD, no thyromegaly or thyroid nodule.  Lungs: Clear to auscultation bilaterally with normal respiratory effort. CV: Nondisplaced PMI.  Heart regular S1/S2, no S3/S4, 2/6 early SEM RUSB.  No peripheral edema.  No carotid bruit.  Normal pedal pulses.  Abdomen: Soft, nontender, no hepatosplenomegaly, no distention.  Skin: Intact without lesions or rashes.  Neurologic: Alert and oriented x 3.  Psych: Normal affect. Extremities: No clubbing or cyanosis.  HEENT: Normal.   Assessment/Plan:  45 yo with bicuspid aortic valve disorder.  She is status post bioprosthetic aortic valve replacement an supracoronary ascending aorta replacement.  She has been doing well symptomatically.  She will need periodic screening of her valve and of her thoracic aorta.  People with bicuspid  aortic valve disorder often will get thoracic aneurysms. BP is not elevated. - Echo in 9/17 showed stable valve.  Will repeat echo in 2019.  - Stable distal ascending aorta dilation on 9/17 MRA, 4.3cm.  Repeat MRA in 9/18.   - Will check lipids today. - Reasonable to take ASA 81 mg daily with bioprosthetic valve.   - Endocarditis prophylaxis with dental work.   Given my transition to CHF clinic, she will followup in 1 year with Dr. Saunders Short.    [name] Short 03/13/2016 11:01 PM

## 2016-03-25 DIAGNOSIS — L245 Irritant contact dermatitis due to other chemical products: Secondary | ICD-10-CM | POA: Diagnosis not present

## 2016-03-25 DIAGNOSIS — D2271 Melanocytic nevi of right lower limb, including hip: Secondary | ICD-10-CM | POA: Diagnosis not present

## 2016-03-25 DIAGNOSIS — D225 Melanocytic nevi of trunk: Secondary | ICD-10-CM | POA: Diagnosis not present

## 2016-03-25 DIAGNOSIS — D1801 Hemangioma of skin and subcutaneous tissue: Secondary | ICD-10-CM | POA: Diagnosis not present

## 2016-04-15 DIAGNOSIS — E049 Nontoxic goiter, unspecified: Secondary | ICD-10-CM | POA: Diagnosis not present

## 2016-07-02 ENCOUNTER — Ambulatory Visit: Payer: Self-pay | Admitting: Surgery

## 2016-07-02 NOTE — H&P (Signed)
Gallbladder - MILD CHRONIC CHOLECYSTITIS AND CHOLESTEROLOSIS - THERE IS NO EVIDENCE OF MALIGNANCY. Enid Cutter MD    Pathology from 8/17 supports mild chronic cholecystitis

## 2016-07-23 NOTE — Progress Notes (Signed)
Final primary diagnosis   Prophylactic removal of organ   Chronic cholecystitis    Family history of gallbladder cancer in 2 first degree relatives

## 2016-09-08 DIAGNOSIS — E890 Postprocedural hypothyroidism: Secondary | ICD-10-CM | POA: Diagnosis not present

## 2016-09-16 DIAGNOSIS — E890 Postprocedural hypothyroidism: Secondary | ICD-10-CM | POA: Diagnosis not present

## 2016-09-25 DIAGNOSIS — Z1231 Encounter for screening mammogram for malignant neoplasm of breast: Secondary | ICD-10-CM | POA: Diagnosis not present

## 2016-09-25 DIAGNOSIS — Z01419 Encounter for gynecological examination (general) (routine) without abnormal findings: Secondary | ICD-10-CM | POA: Diagnosis not present

## 2016-09-25 DIAGNOSIS — Z1212 Encounter for screening for malignant neoplasm of rectum: Secondary | ICD-10-CM | POA: Diagnosis not present

## 2016-09-25 DIAGNOSIS — Z6829 Body mass index (BMI) 29.0-29.9, adult: Secondary | ICD-10-CM | POA: Diagnosis not present

## 2017-01-30 ENCOUNTER — Ambulatory Visit (HOSPITAL_COMMUNITY): Payer: BLUE CROSS/BLUE SHIELD

## 2017-03-03 ENCOUNTER — Encounter: Payer: Self-pay | Admitting: Emergency Medicine

## 2017-03-03 ENCOUNTER — Ambulatory Visit (INDEPENDENT_AMBULATORY_CARE_PROVIDER_SITE_OTHER): Payer: BLUE CROSS/BLUE SHIELD | Admitting: Emergency Medicine

## 2017-03-03 VITALS — BP 102/72 | HR 89 | Temp 98.5°F | Resp 16 | Ht 66.0 in | Wt 186.8 lb

## 2017-03-03 DIAGNOSIS — H6123 Impacted cerumen, bilateral: Secondary | ICD-10-CM

## 2017-03-03 NOTE — Patient Instructions (Addendum)
   IF you received an x-ray today, you will receive an invoice from [facility]. Please contact [facility] at [phone] with questions or concerns regarding your invoice.   IF you received labwork today, you will receive an invoice from [facility]. Please contact [facility] at [phone] with questions or concerns regarding your invoice.   Our billing staff will not be able to assist you with questions regarding bills from these companies.  You will be contacted with the lab results as soon as they are available. The fastest way to get your results is to activate your My Chart account. Instructions are located on the last page of this paperwork. If you have not heard from us regarding the results in 2 weeks, please contact this office.     Earwax Buildup, Adult The ears produce a substance called earwax that helps keep bacteria out of the ear and protects the skin in the ear canal. Occasionally, earwax can build up in the ear and cause discomfort or hearing loss. What increases the risk? This condition is more likely to develop in people who:  Are female.  Are elderly.  Naturally produce more earwax.  Clean their ears often with cotton swabs.  Use earplugs often.  Use in-ear headphones often.  Wear hearing aids.  Have narrow ear canals.  Have earwax that is overly thick or sticky.  Have eczema.  Are dehydrated.  Have excess hair in the ear canal.  What are the signs or symptoms? Symptoms of this condition include:  Reduced or muffled hearing.  A feeling of fullness in the ear or feeling that the ear is plugged.  Fluid coming from the ear.  Ear pain.  Ear itch.  Ringing in the ear.  Coughing.  An obvious piece of earwax that can be seen inside the ear canal.  How is this diagnosed? This condition may be diagnosed based on:  Your symptoms.  Your medical history.  An ear exam. During the exam, your health care provider will look  into your ear with an instrument called an otoscope.  You may have tests, including a hearing test. How is this treated? This condition may be treated by:  Using ear drops to soften the earwax.  Having the earwax removed by a health care provider. The health care provider may: ? Flush the ear with water. ? Use an instrument that has a loop on the end (curette). ? Use a suction device.  Surgery to remove the wax buildup. This may be done in severe cases.  Follow these instructions at home:  Take over-the-counter and prescription medicines only as told by your health care provider.  Do not put any objects, including cotton swabs, into your ear. You can clean the opening of your ear canal with a washcloth or facial tissue.  Follow instructions from your health care provider about cleaning your ears. Do not over-clean your ears.  Drink enough fluid to keep your urine clear or pale yellow. This will help to thin the earwax.  Keep all follow-up visits as told by your health care provider. If earwax builds up in your ears often or if you use hearing aids, consider seeing your health care provider for routine, preventive ear cleanings. Ask your health care provider how often you should schedule your cleanings.  If you have hearing aids, clean them according to instructions from the manufacturer and your health care provider. Contact a health care provider if:  You have ear pain.  You develop   a fever.  You have blood, pus, or other fluid coming from your ear.  You have hearing loss.  You have ringing in your ears that does not go away.  Your symptoms do not improve with treatment.  You feel like the room is spinning (vertigo). Summary  Earwax can build up in the ear and cause discomfort or hearing loss.  The most common symptoms of this condition include reduced or muffled hearing and a feeling of fullness in the ear or feeling that the ear is plugged.  This condition may be  diagnosed based on your symptoms, your medical history, and an ear exam.  This condition may be treated by using ear drops to soften the earwax or by having the earwax removed by a health care provider.  Do not put any objects, including cotton swabs, into your ear. You can clean the opening of your ear canal with a washcloth or facial tissue. This information is not intended to replace advice given to you by your health care provider. Make sure you discuss any questions you have with your health care provider. Document Released: 06/12/2004 Document Revised: 07/16/2016 Document Reviewed: 07/16/2016 Elsevier Interactive Patient Education  2018 Elsevier Inc.  

## 2017-03-03 NOTE — Progress Notes (Signed)
[name] Short 46 y.o.   Chief Complaint  Patient presents with  . Ear Problem    both - per patient RIGHT ear clogged with pain x 2 weeks    HISTORY OF PRESENT ILLNESS: This is a 46 y.o. female complaining of clogged ears with hearing loss x 2 weeks. Also has some pain/discomfort on the right side. No other symptomatology.  HPI   Prior to Admission medications   Medication Sig Start Date End Date Taking? Authorizing Provider  aspirin EC 81 MG tablet Take 1 tablet (81 mg total) by mouth daily. 03/12/16  Yes Larey Dresser, MD  NP THYROID 15 MG tablet Take 15 mg by mouth daily. 02/12/16  Yes [provider]  diazepam (VALIUM) 5 MG tablet Take a one time dose of 2.5mg  orally prior to procedure Patient not taking: Reported on 03/03/2017 01/25/16   Larey Dresser, MD    Allergies  Allergen Reactions  . Amoxicillin Itching  . Bactroban [Mupirocin] Itching, Swelling and Other (See Comments)    Numbness of upper lip    Patient Active Problem List   Diagnosis Date Noted  . Family history of cancer of gallbladder 12/12/2015  . H/O aortic valve replacement 06/22/2012  . Ascending aortic aneurysm ([place]) 06/22/2012  . Aneurysm of aortic root ([place]) 01/06/2012  . Nontoxic multinodular goiter 01/06/2012  . 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 Breast feeding status of mother   . Complication of anesthesia   . Congenital heart defect    bicuspid aortic valve, aortic insufficeincy, ascending aortic aneurysm => s/p AVR (bioprosthetic) and ascending aortic aneurysm repair in 2008  . Family history of adverse reaction to anesthesia    Dad who also had CAD (valve disease) "coded" with anesthesia   . Heart murmur   . History of uterine fibroid   . Hx of cardiac cath    [facility] (2008, pre-surgery) with no coronary artery disease, EF 60% on LV-gram.  . Hx of cardiovascular stress test    ETT-Echo (7/15):  normal  . Hx of echocardiogram    Echo  (7/15):  Mod LVH, EF 55-60%, no RWMA, + diast dysfunction, AVR ok (mean 16 mmHg), asc aorta 4 cm, trivial MR  . Hypothyroidism   . Multiple thyroid nodules    H/o goiter with thyroid surgery in 8/13  . PONV (postoperative nausea and vomiting)     Past Surgical History:  Procedure Laterality Date  . AORTIC VALVE REPLACEMENT     with replacement of ascending aorta  . CARDIAC CATHETERIZATION    . CARDIAC VALVE REPLACEMENT    . CESAREAN SECTION  2011  . CHOLECYSTECTOMY N/A 12/20/2015   Procedure: LAPAROSCOPIC CHOLECYSTECTOMY WITH INTRAOPERATIVE CHOLANGIOGRAM;  Surgeon: [name], MD;  Location: [facility];  Service: General;  Laterality: N/A;  . MYOMECTOMY    . open heart surgery    . THYROID LOBECTOMY  12/31/2011   Procedure: THYROID LOBECTOMY;  Surgeon: Izora Gala, MD;  Location: [place];  Service: ENT;  Laterality: Right;  Substernal Goiter Removal  . THYROID LOBECTOMY  2003    Social History   Social History  . Marital status: Married    Spouse name: N/A  . Number of children: N/A  . Years of education: N/A   Occupational History  . Not on file.   Social History Main Topics  . Smoking status: Never Smoker  . Smokeless tobacco: Never Used     Comment:  only smoked very infrequently in college  . Alcohol use 3.0 oz/week    5 Standard drinks or equivalent per week     Comment: 1 glass wine per day  . Drug use: No  . Sexual activity: Not on file   Other Topics Concern  . Not on file   Social History Narrative  . No narrative on file    Family History  Problem Relation Age of Onset  . Cancer Mother 1       Gall bladder; found at time gallstone found; smoker  . Hypertension Mother   . Gallstones Mother   . Other Mother        hx of hysterectomy for unspecified reason  . Prostate cancer Father        dx early 36s; not very aggressive; s/p seed implants  . Skin cancer Father        unspecified type; +sun exposure  . Heart Problems Father        same as pt's; s/p  surgery  . Goiter Sister   . Hypothyroidism Sister   . Thyroid disease Sister   . Cancer Maternal Grandmother        Gallbladder dx. 58-59; smoker  . Heart disease Maternal Grandfather   . Heart Problems Maternal Grandfather        heart disease  . Heart Problems Paternal Uncle        heart disease, d. 41s; +EtOH abuse  . Other Sister        hx of 1 benign breast biopys  . Ovarian cancer Other        maternal great aunt (MGM's sister); lim info     Review of Systems  Constitutional: Negative.  Negative for chills and fever.  HENT: Positive for ear pain and hearing loss. Negative for congestion, ear discharge, nosebleeds, sinus pain and sore throat.   Eyes: Negative for discharge and redness.  Respiratory: Negative for shortness of breath.   Cardiovascular: Negative for chest pain.  Gastrointestinal: Negative for abdominal pain, nausea and vomiting.  Neurological: Negative for dizziness and headaches.  All other systems reviewed and are negative.  Vitals:   03/03/17 1159  BP: 102/72  Pulse: 89  Resp: 16  Temp: 98.5 F (36.9 C)  SpO2: 100%     Physical Exam  Constitutional: She is oriented to person, place, and time. She appears well-developed and well-nourished.  HENT:  Head: Normocephalic and atraumatic.  Bilateral cerumenosis  Eyes: Pupils are equal, round, and reactive to light. EOM are normal.  Neck: Normal range of motion.  Cardiovascular: Normal rate.   Pulmonary/Chest: Effort normal.  Neurological: She is alert and oriented to person, place, and time.  Skin: Skin is warm and dry. Capillary refill takes less than 2 seconds.  Psychiatric: She has a normal mood and affect. Her behavior is normal.  Vitals reviewed.  Post-irrigation exam: TM's WNL; no infection; complete successful wax removal from both ears. Ceruminosis is noted.  Wax is removed by syringing and manual debridement. Instructions for home care to prevent wax buildup are given.   ASSESSMENT &  PLAN: [name] was seen today for ear problem.  Diagnoses and all orders for this visit:  Bilateral hearing loss due to cerumen impaction -     Ear wax removal    Patient Instructions       IF you received an x-ray today, you will receive an invoice from [facility]. Please contact [facility] at [phone] with questions or  concerns regarding your invoice.   IF you received labwork today, you will receive an invoice from [facility]. Please contact [facility] at [phone] with questions or concerns regarding your invoice.   Our billing staff will not be able to assist you with questions regarding bills from these companies.  You will be contacted with the lab results as soon as they are available. The fastest way to get your results is to activate your My Chart account. Instructions are located on the last page of this paperwork. If you have not heard from [place] regarding the results in 2 weeks, please contact this office.     Earwax Buildup, Adult The ears produce a substance called earwax that helps keep bacteria out of the ear and protects the skin in the ear canal. Occasionally, earwax can build up in the ear and cause discomfort or hearing loss. What increases the risk? This condition is more likely to develop in people who:  Are female.  Are elderly.  Naturally produce more earwax.  Clean their ears often with cotton swabs.  Use earplugs often.  Use in-ear headphones often.  Wear hearing aids.  Have narrow ear canals.  Have earwax that is overly thick or sticky.  Have eczema.  Are dehydrated.  Have excess hair in the ear canal.  What are the signs or symptoms? Symptoms of this condition include:  Reduced or muffled hearing.  A feeling of fullness in the ear or feeling that the ear is plugged.  Fluid coming from the ear.  Ear pain.  Ear itch.  Ringing in the ear.  Coughing.  An obvious piece of earwax that can be seen inside the  ear canal.  How is this diagnosed? This condition may be diagnosed based on:  Your symptoms.  Your medical history.  An ear exam. During the exam, your health care provider will look into your ear with an instrument called an otoscope.  You may have tests, including a hearing test. How is this treated? This condition may be treated by:  Using ear drops to soften the earwax.  Having the earwax removed by a health care provider. The health care provider may: ? Flush the ear with water. ? Use an instrument that has a loop on the end (curette). ? Use a suction device.  Surgery to remove the wax buildup. This may be done in severe cases.  Follow these instructions at home:  Take over-the-counter and prescription medicines only as told by your health care provider.  Do not put any objects, including cotton swabs, into your ear. You can clean the opening of your ear canal with a washcloth or facial tissue.  Follow instructions from your health care provider about cleaning your ears. Do not over-clean your ears.  Drink enough fluid to keep your urine clear or pale yellow. This will help to thin the earwax.  Keep all follow-up visits as told by your health care provider. If earwax builds up in your ears often or if you use hearing aids, consider seeing your health care provider for routine, preventive ear cleanings. Ask your health care provider how often you should schedule your cleanings.  If you have hearing aids, clean them according to instructions from the manufacturer and your health care provider. Contact a health care provider if:  You have ear pain.  You develop a fever.  You have blood, pus, or other fluid coming from your ear.  You have hearing loss.  You have ringing in your ears  that does not go away.  Your symptoms do not improve with treatment.  You feel like the room is spinning (vertigo). Summary  Earwax can build up in the ear and cause discomfort or  hearing loss.  The most common symptoms of this condition include reduced or muffled hearing and a feeling of fullness in the ear or feeling that the ear is plugged.  This condition may be diagnosed based on your symptoms, your medical history, and an ear exam.  This condition may be treated by using ear drops to soften the earwax or by having the earwax removed by a health care provider.  Do not put any objects, including cotton swabs, into your ear. You can clean the opening of your ear canal with a washcloth or facial tissue. This information is not intended to replace advice given to you by your health care provider. Make sure you discuss any questions you have with your health care provider. Document Released: 06/12/2004 Document Revised: 07/16/2016 Document Reviewed: 07/16/2016 Elsevier Interactive Patient Education  2018 Elsevier Inc.      Agustina Caroli, MD [facility]

## 2017-03-13 ENCOUNTER — Ambulatory Visit (INDEPENDENT_AMBULATORY_CARE_PROVIDER_SITE_OTHER): Payer: BLUE CROSS/BLUE SHIELD | Admitting: Internal Medicine

## 2017-03-13 ENCOUNTER — Encounter: Payer: Self-pay | Admitting: Internal Medicine

## 2017-03-13 VITALS — BP 120/84 | HR 72 | Ht 66.0 in | Wt 187.8 lb

## 2017-03-13 DIAGNOSIS — Z8774 Personal history of (corrected) congenital malformations of heart and circulatory system: Secondary | ICD-10-CM | POA: Diagnosis not present

## 2017-03-13 DIAGNOSIS — I7121 Aneurysm of the ascending aorta, without rupture: Secondary | ICD-10-CM

## 2017-03-13 DIAGNOSIS — Z952 Presence of prosthetic heart valve: Secondary | ICD-10-CM | POA: Diagnosis not present

## 2017-03-13 DIAGNOSIS — I712 Thoracic aortic aneurysm, without rupture: Secondary | ICD-10-CM

## 2017-03-13 DIAGNOSIS — Z01812 Encounter for preprocedural laboratory examination: Secondary | ICD-10-CM

## 2017-03-13 LAB — BASIC METABOLIC PANEL
BUN / CREAT RATIO: 11 (ref 9–23)
BUN: 10 mg/dL (ref 6–24)
CALCIUM: 9.4 mg/dL (ref 8.7–10.2)
CHLORIDE: 102 mmol/L (ref 96–106)
CO2: 21 mmol/L (ref 20–29)
Creatinine, Ser: 0.89 mg/dL (ref 0.57–1.00)
GFR calc Af Amer: 89 mL/min/{1.73_m2} (ref >59)
GFR calc non Af Amer: 77 mL/min/{1.73_m2} (ref >59)
GLUCOSE: 91 mg/dL (ref 65–99)
POTASSIUM: 4.5 mmol/L (ref 3.5–5.2)
Sodium: 137 mmol/L (ref 134–144)

## 2017-03-13 MED ORDER — ALPRAZOLAM 0.5 MG PO TABS: 0.5000 mg | ORAL_TABLET | ORAL | 0 refills | Status: DC

## 2017-03-13 NOTE — Patient Instructions (Signed)
Your physician recommends that you continue on your current medications as directed. Please refer to the Current Medication list given to you today.  MRA  OF  CHEST   Your physician recommends that you return for lab work in:  [place] physician has requested that you have an echocardiogram. Echocardiography is a painless test that uses sound waves to create images of your heart. It provides your doctor with information about the size and shape of your heart and how well your heart's chambers and valves are working. This procedure takes approximately one hour. There are no restrictions for this procedure. [name] Short wants you to follow-up in: [facility] You will receive a reminder letter in the mail two months in advance. If you don't receive a letter, please call our office to schedule the follow-up appointment.

## 2017-03-13 NOTE — Progress Notes (Signed)
Follow-up Outpatient Visit Date: 03/13/2017  Primary Care Provider: Patient, No Pcp Per No address on file  Chief Complaint: Follow-up AVR and aortic aneurysm  HPI:  [name] Short is a 46 y.o. year-old female with history of bicuspid aortic valve complicated by severe aortic regurgitation and ascending aortic aneurysm status post I will prosthetic aVR and super coronary ascending aorta replacement in 2008, who presents for follow-up of aVR and aortic aneurysm. Bioprosthetic aortic valve was implanted, as the patient wished to have a child. She was previously followed in our office by Dr. Aundra Dubin, having last been seen a year ago.  Today, [name] Short reports feeling well. She denies chest pain, shortness of breath, palpitations, lightheadedness, and edema. She exercises regularly but refrains for lifting heavy objects. She takes prophylactic clindamycin before all dental work. She has not had MRA of the chest this year, as she wanted to discuss the test at today's visit. She has had issues with anxiety during MRIs in the past and states that previously prescribed diazepam did not help much. She has used alprazolam in the past before dental procedures with better relief of her anxiety.  --------------------------------------------------------------------------------------------------  PMH/PSH: 1. H/o goiter with thyroid surgery in 8/13.  2. H/o uterine fibroids 3. Bicuspid aortic valve disorder: Developed severe aortic insufficiency.  She had AVR in 2008 with a 25 mm bioprosthetic aortic valve (bioprosthetic because she wanted to get pregnant).  She additionally had a supracoronary ascending aorta replacement for associated ascending aortic aneurysm.  MRA chest (2/14) proximal ascending aorta with stable repair, distal ascending aorta measured 4.0 cm).  Echo (7/15) with EF 55-60%, bioprosthetic aortic valve with mean gradient 16 mmHg, no significant AI, distal ascending aorta 4.0 cm.  MRA chest 9/16 with  4.4 cm ascending aorta.  - Echo (8/17): EF 60-65%, bioprosthetic aortic valve with mean gradient 16 mmHg, trivial MR, PASP 25 mmHg. - MRA chest (9/17) with stable 4.3 cm dilation distal ascending aorta.  4. LHC (2008, pre-surgery) with no coronary artery disease, EF 60% on LV-gram.  Stress echo 7/15 normal.  5. S/p cholecystectomy.  Current Meds  Medication Sig  . aspirin EC 81 MG tablet Take 1 tablet (81 mg total) by mouth daily.  . diazepam (VALIUM) 5 MG tablet Take a one time dose of 2.5mg  orally prior to procedure  . NP THYROID 15 MG tablet Take 15 mg by mouth daily.    Allergies: Amoxicillin and Bactroban [mupirocin]  Social History   Social History  . Marital status: Married    Spouse name: N/A  . Number of children: N/A  . Years of education: N/A   Occupational History  . Not on file.   Social History Main Topics  . Smoking status: Never Smoker  . Smokeless tobacco: Never Used     Comment: only smoked very infrequently in college  . Alcohol use 3.0 oz/week    5 Standard drinks or equivalent per week     Comment: 1 glass wine per day  . Drug use: No  . Sexual activity: Not on file   Other Topics Concern  . Not on file   Social History Narrative  . No narrative on file    Family History  Problem Relation Age of Onset  . Cancer Mother 59       Gall bladder; found at time gallstone found; smoker  . Hypertension Mother   . Gallstones Mother   . Other Mother        hx of  hysterectomy for unspecified reason  . Prostate cancer Father        dx early 34s; not very aggressive; s/p seed implants  . Skin cancer Father        unspecified type; +sun exposure  . Heart Problems Father        same as pt's; s/p surgery  . Goiter Sister   . Hypothyroidism Sister   . Thyroid disease Sister   . Cancer Maternal Grandmother        Gallbladder dx. 58-59; smoker  . Heart disease Maternal Grandfather   . Heart Problems Maternal Grandfather        heart disease  . Heart  Problems Paternal Uncle        heart disease, d. 78s; +EtOH abuse  . Other Sister        hx of 1 benign breast biopys  . Ovarian cancer Other        maternal great aunt (MGM's sister); lim info    Review of Systems: A 12-system review of systems was performed and was negative except as noted in the HPI.  --------------------------------------------------------------------------------------------------  Physical Exam: BP 120/84   Pulse 72   Ht 5\' 6"  (1.676 m)   Wt 187 lb 12.8 oz (85.2 kg)   LMP 02/19/2017   BMI 30.31 kg/m   General: Sitting comfortably in exam room. No acute distress. HEENT: No conjunctival pallor or scleral icterus. Moist mucous membranes.  OP clear. Neck: Supple without lymphadenopathy, thyromegaly, JVD, or HJR. No carotid bruit. Lungs: Normal work of breathing. Clear to auscultation bilaterally without wheezes or crackles. Heart: Regular rate and rhythm with 2/6 crescendo-decrescendo systolic murmur loudest at the RUSB. No rubs or gallops. Abd: Bowel sounds present. Soft, NT/ND without hepatosplenomegaly Ext: No lower extremity edema. Radial, PT, and DP pulses are 2+ bilaterally. Skin: Warm and dry without rash. Well-healed median sternotomy incision.  EKG:  NSR without abnormalities.  Lab Results  Component Value Date   WBC 4.8 12/13/2015   HGB 13.6 12/13/2015   HCT 40.5 12/13/2015   MCV 90.6 12/13/2015   PLT 171 12/13/2015    Lab Results  Component Value Date   NA 137 03/13/2017   K 4.5 03/13/2017   CL 102 03/13/2017   CO2 21 03/13/2017   BUN 10 03/13/2017   CREATININE 0.89 03/13/2017   GLUCOSE 91 03/13/2017   ALT 8 12/24/2011    Lab Results  Component Value Date   CHOL 208 (H) 03/12/2016   HDL 48 03/12/2016   LDLCALC 130 (H) 03/12/2016   TRIG 151 (H) 03/12/2016   CHOLHDL 4.3 03/12/2016    --------------------------------------------------------------------------------------------------  ASSESSMENT AND PLAN: Thoracic aortic  aneurysm No symptoms. Blood pressure reasonably well controlled. Dilation of the aortic root was stable last year at up to 4.3 cm in the proximal ascending aorta. We have agreed to repeat MRA of the chest to ensure stability. Prescription for alprazolam 0.5 mg to be taken immediately prior to MRI (can be repeated once) was given. Patient advised to bring a driver with her. BMP checked today to assess renal function in anticipation of MRA.  History of bicuspid aortic valve status post bioprosthetic AVR No symptoms to suggest valve dysfunction. We will plan to repeat an echo in 1 year, shortly before [name] Short follows up with me. She should remain on ASA 81 mg daily and continue prophylactic antibiotics before any dental procedure.  Follow-up: Return to clinic in 1 year.  Nelva Bush, MD 03/13/2017 6:28 PM

## 2017-03-17 ENCOUNTER — Ambulatory Visit (HOSPITAL_COMMUNITY): Payer: BLUE CROSS/BLUE SHIELD

## 2017-03-25 ENCOUNTER — Ambulatory Visit (HOSPITAL_COMMUNITY)
Admission: RE | Admit: 2017-03-25 | Discharge: 2017-03-25 | Disposition: A | Discharge status: Home / Self Care | Payer: BLUE CROSS/BLUE SHIELD | Source: Ambulatory Visit | Attending: Internal Medicine | Admitting: Internal Medicine

## 2017-03-25 DIAGNOSIS — I7121 Aneurysm of the ascending aorta, without rupture: Secondary | ICD-10-CM

## 2017-03-25 DIAGNOSIS — I714 Abdominal aortic aneurysm, without rupture: Secondary | ICD-10-CM | POA: Diagnosis not present

## 2017-03-25 DIAGNOSIS — I712 Thoracic aortic aneurysm, without rupture: Secondary | ICD-10-CM

## 2017-03-25 DIAGNOSIS — I350 Nonrheumatic aortic (valve) stenosis: Secondary | ICD-10-CM | POA: Diagnosis not present

## 2017-03-25 MED ORDER — GADOBENATE DIMEGLUMINE 529 MG/ML IV SOLN
20.0000 mL | Freq: Once | INTRAVENOUS | Status: AC
  Administered 2017-03-25: 20 mL via INTRAVENOUS

## 2017-03-26 DIAGNOSIS — D225 Melanocytic nevi of trunk: Secondary | ICD-10-CM | POA: Diagnosis not present

## 2017-03-26 DIAGNOSIS — D2271 Melanocytic nevi of right lower limb, including hip: Secondary | ICD-10-CM | POA: Diagnosis not present

## 2017-03-26 DIAGNOSIS — D2272 Melanocytic nevi of left lower limb, including hip: Secondary | ICD-10-CM | POA: Diagnosis not present

## 2017-03-26 DIAGNOSIS — D2262 Melanocytic nevi of left upper limb, including shoulder: Secondary | ICD-10-CM | POA: Diagnosis not present

## 2017-04-22 IMAGING — CT CT ANGIO CHEST
1 series · 1 of 1 positions shown · IV contrast (omnipaque)
Comparison: [DATE]

CLINICAL DATA: Aortic aneurysm

EXAM:
CT ANGIOGRAPHY CHEST WITH CONTRAST
TECHNIQUE: Multidetector CT imaging of the chest was performed using the
standard protocol during bolus administration of intravenous
contrast. Multiplanar CT image reconstructions and MIPs were
obtained to evaluate the vascular anatomy.
CONTRAST:  80mL OMNIPAQUE IOHEXOL 350 MG/ML SOLN

[Series 1: topogram 1.0 t20s · coronal · 1.0mm · 1.00mm/px · 1 of 1 slices shown]
[im 1/1]
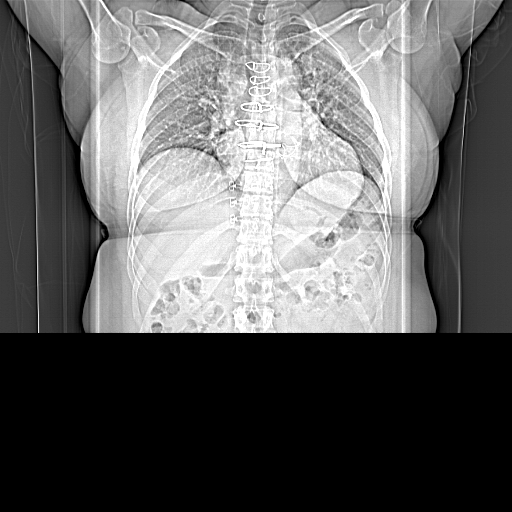

[1 of 1 positions shown; findings below may reference images not displayed]

FINDINGS: Status post aortic valve root replacement is again noted. Maximal
diameters of the ascending aorta at the sinus of Valsalva,
Cheli junction, and ascending aorta are 4.6 cm, 3.9 cm, and
4.4 cm. Previously, maximal diameter of the ascending aorta was
cm, therefore the aorta has somewhat dilated since the prior study.
The metallic valve remain stable in position. There is no evidence
of dissection.

Great vessels are patent within the confines of the study. Vertebral
arteries are also patent.

There is no obvious evidence of acute pulmonary thromboembolism.

There is a soft tissue area in the anterior mediastinum in the
location of the thymus measuring 1.6 x 1.5 x 1.6 cm. In retrospect,
this has not significantly changed since the prior study. On the
prior study, refer to image 14 of series 11 and image 19 of series
0220. It may represent ectopic thyroid tissue. A benign thymic
lesion or lymph node is also a consideration.

There is no other evidence of abnormal adenopathy.

No pneumothorax or pleural effusion.

Clear lungs.

No acute bony deformity.

Review of the MIP images confirms the above findings.
IMPRESSION: Aneurysmal dilatation of the ascending aorta has increased from
cm to 4.4 cm in maximal caliber.

Stable small anterior mediastinal soft tissue mass supporting benign
etiology. Differential diagnosis includes thymic lesion, lymph node,
or ectopic thyroid tissue.

## 2017-08-05 DIAGNOSIS — N938 Other specified abnormal uterine and vaginal bleeding: Secondary | ICD-10-CM | POA: Diagnosis not present

## 2017-08-21 ENCOUNTER — Ambulatory Visit: Payer: BLUE CROSS/BLUE SHIELD | Admitting: Emergency Medicine

## 2017-08-21 ENCOUNTER — Encounter: Payer: Self-pay | Admitting: Urgent Care

## 2017-08-21 ENCOUNTER — Ambulatory Visit: Payer: BLUE CROSS/BLUE SHIELD | Admitting: Urgent Care

## 2017-08-21 VITALS — BP 114/82 | HR 87 | Temp 98.1°F | Resp 18 | Ht 66.0 in | Wt 193.2 lb

## 2017-08-21 DIAGNOSIS — M25532 Pain in left wrist: Secondary | ICD-10-CM | POA: Diagnosis not present

## 2017-08-21 DIAGNOSIS — M67432 Ganglion, left wrist: Secondary | ICD-10-CM | POA: Diagnosis not present

## 2017-08-21 DIAGNOSIS — S60222A Contusion of left hand, initial encounter: Secondary | ICD-10-CM | POA: Diagnosis not present

## 2017-08-21 MED ORDER — CELECOXIB 100 MG PO CAPS: 100.0000 mg | ORAL_CAPSULE | Freq: Two times a day (BID) | ORAL | 1 refills | Status: DC

## 2017-08-21 NOTE — Progress Notes (Addendum)
    MRN: [id number] DOB: 08-18-1970  Subjective:   [name] Short is a 47 y.o. female presenting for left wrist injury. Patient works out at a [employer]. ~1.5 weeks ago, patient made contact with back of her hand up against a metal piece of the punching bag. She subsequently, started using her hand due to her left hand/knuckle pain. She continued to work out without modifying her activities too much. Yesterday, patient tried the ab roller and her left wrist pain worsened. She is left handed and has not really rested. She also has a history of a ganglion cyst of volar surface of her wrist that was excised many years ago. She has not tried any medications for relief.   [name] Short has a current medication list which includes the following prescription(s): alprazolam, aspirin ec, and np thyroid. Also is allergic to amoxicillin and bactroban [mupirocin].  [name] Short  has a past medical history of Breast feeding status of mother, Complication of anesthesia, Congenital heart defect, Family history of adverse reaction to anesthesia, Heart murmur, History of uterine fibroid, cardiac cath, cardiovascular stress test, echocardiogram, Hypothyroidism, Multiple thyroid nodules, and PONV (postoperative nausea and vomiting). Also  has a past surgical history that includes Aortic valve replacement; Cesarean section (2011); Thyroid lobectomy (12/31/2011); open heart surgery; Myomectomy; Cardiac valve replacement; Cardiac catheterization; Cholecystectomy (N/A, 12/20/2015); and Thyroid lobectomy (2003).  Objective:   Vitals: BP 114/82   Pulse 87   Temp 98.1 F (36.7 C) (Oral)   Resp 18   Ht 5\' 6"  (1.676 m)   Wt 193 lb 3.2 oz (87.6 kg)   SpO2 98%   BMI 31.18 kg/m   Physical Exam  Constitutional: She is oriented to person, place, and time. She appears well-developed and well-nourished.  Cardiovascular: Normal rate.  Pulmonary/Chest: Effort normal.  Musculoskeletal:       Left wrist: She exhibits tenderness (over  volar aspect of left wrist). She exhibits normal range of motion, no bony tenderness, no swelling, no effusion, no crepitus, no deformity and no laceration.       Left hand: She exhibits normal range of motion, no tenderness, no bony tenderness, normal capillary refill, no deformity, no laceration and no swelling. Normal sensation noted. Normal strength noted.  Neurological: She is alert and oriented to person, place, and time.   Assessment and Plan :   Left wrist pain  Contusion of left hand, initial encounter  Ganglion cyst of wrist, left  Will start Celebrex and modifications of activities. Counseled patient on potential for adverse effects with medications prescribed today, patient verbalized understanding. Return-to-clinic precautions discussed, patient verbalized understanding.   Jaynee Eagles, PA-C [facility] [phone] 08/21/2017  1:56 PM

## 2017-08-21 NOTE — Addendum Note (Signed)
Addended by: Burnis Kingfisher on: 08/21/2017 02:29 PM   Modules accepted: Orders

## 2017-08-21 NOTE — Patient Instructions (Addendum)
Celecoxib capsules  What is this medicine?  CELECOXIB (sell a KOX ib) is a non-steroidal anti-inflammatory drug (NSAID). This medicine is used to treat arthritis and ankylosing spondylitis. It may be also used for pain or painful monthly periods.  This medicine may be used for other purposes; ask your health care provider or pharmacist if you have questions.  COMMON BRAND NAME(S): Celebrex  What should I tell my health care provider before I take this medicine?  They need to know if you have any of these conditions:  -asthma  -coronary artery bypass graft (CABG) surgery within the past 2 weeks  -drink more than 3 alcohol-containing drinks a day  -heart disease or circulation problems like heart failure or leg edema (fluid retention)  -high blood pressure  -kidney disease  -liver disease  -stomach bleeding or ulcers  -an unusual or allergic reaction to celecoxib, sulfa drugs, aspirin, other NSAIDs, other medicines, foods, dyes, or preservatives  -pregnant or trying to get pregnant  -breast-feeding  How should I use this medicine?  Take this medicine by mouth with a full glass of water. Follow the directions on the prescription label. Take it with food if it upsets your stomach or if you take 400 mg at one time. Try to not lie down for at least 10 minutes after you take the medicine. Take the medicine at the same time each day. Do not take more medicine than you are told to take. Long-term, continuous use may increase the risk of heart attack or stroke.  A special MedGuide will be given to you by the pharmacist with each prescription and refill. Be sure to read this information carefully each time.  Talk to your pediatrician regarding the use of this medicine in children. Special care may be needed.  Overdosage: If you think you have taken too much of this medicine contact a poison control center or emergency room at once.  NOTE: This medicine is only for you. Do not share this medicine with others.  What if I miss  a dose?  If you miss a dose, take it as soon as you can. If it is almost time for your next dose, take only that dose. Do not take double or extra doses.  What may interact with this medicine?  Do not take this medicine with any of the following medications:  -cidofovir  -methotrexate  -other NSAIDs, medicines for pain and inflammation, like ibuprofen or naproxen  -pemetrexed  This medicine may also interact with the following medications:  -alcohol  -aspirin and aspirin-like drugs  -diuretics  -fluconazole  -lithium  -medicines for high blood pressure  -steroid medicines like prednisone or cortisone  -warfarin  This list may not describe all possible interactions. Give your health care provider a list of all the medicines, herbs, non-prescription drugs, or dietary supplements you use. Also tell them if you smoke, drink alcohol, or use illegal drugs. Some items may interact with your medicine.  What should I watch for while using this medicine?  Tell your doctor or health care professional if your pain does not get better. Talk to your doctor before taking another medicine for pain. Do not treat yourself.  This medicine does not prevent heart attack or stroke. In fact, this medicine may increase the chance of a heart attack or stroke. The chance may increase with longer use of this medicine and in people who have heart disease. If you take aspirin to prevent heart attack or stroke, talk with your   medicine. This medicine can cause ulcers and bleeding in the stomach and intestines at any time during treatment. Ulcers and bleeding can happen without warning symptoms and can cause death. What side effects may I notice from receiving this medicine? Side  effects that you should report to your doctor or health care professional as soon as possible: -allergic reactions like skin rash, itching or hives, swelling of the face, lips, or tongue -black or bloody stools, blood in the urine or vomit -blurred vision -breathing problems -chest pain -nausea, vomiting -problems with balance, talking, walking -redness, blistering, peeling or loosening of the skin, including inside the mouth -unexplained weight gain or swelling -unusually weak or tired -yellowing of eyes, skin Side effects that usually do not require medical attention (report to your doctor or health care professional if they continue or are bothersome): -constipation or diarrhea -dizziness -gas or heartburn -upset stomach This list may not describe all possible side effects. Call your doctor for medical advice about side effects. You may report side effects to FDA at 1-800-FDA-1088. Where should I keep my medicine? Keep out of the reach of children. Store at room temperature between 15 and 30 degrees C (59 and 86 degrees F). Keep container tightly closed. Throw away any unused medicine after the expiration date. NOTE: This sheet is a summary. It may not cover all possible information. If you have questions about this medicine, talk to your doctor, pharmacist, or health care provider.  2018 Elsevier/Gold Standard (2009-07-04 10:54:17)     IF you received an x-ray today, you will receive an invoice from [facility]. Please contact [facility] at [phone] with questions or concerns regarding your invoice.   IF you received labwork today, you will receive an invoice from [facility]. Please contact [facility] at [phone] with questions or concerns regarding your invoice.   Our billing staff will not be able to assist you with questions regarding bills from these companies.  You will be contacted with the lab results as soon as they are available. The fastest way  to get your results is to activate your My Chart account. Instructions are located on the last page of this paperwork. If you have not heard from [place] regarding the results in 2 weeks, please contact this office.

## 2017-09-11 DIAGNOSIS — E890 Postprocedural hypothyroidism: Secondary | ICD-10-CM | POA: Diagnosis not present

## 2017-09-17 DIAGNOSIS — E890 Postprocedural hypothyroidism: Secondary | ICD-10-CM | POA: Diagnosis not present

## 2017-09-29 DIAGNOSIS — Z6830 Body mass index (BMI) 30.0-30.9, adult: Secondary | ICD-10-CM | POA: Diagnosis not present

## 2017-09-29 DIAGNOSIS — Z1231 Encounter for screening mammogram for malignant neoplasm of breast: Secondary | ICD-10-CM | POA: Diagnosis not present

## 2017-09-29 DIAGNOSIS — Z01419 Encounter for gynecological examination (general) (routine) without abnormal findings: Secondary | ICD-10-CM | POA: Diagnosis not present

## 2017-09-29 DIAGNOSIS — Z1212 Encounter for screening for malignant neoplasm of rectum: Secondary | ICD-10-CM | POA: Diagnosis not present

## 2017-10-05 DIAGNOSIS — N924 Excessive bleeding in the premenopausal period: Secondary | ICD-10-CM | POA: Diagnosis not present

## 2017-10-05 DIAGNOSIS — D259 Leiomyoma of uterus, unspecified: Secondary | ICD-10-CM | POA: Diagnosis not present

## 2017-10-05 DIAGNOSIS — N882 Stricture and stenosis of cervix uteri: Secondary | ICD-10-CM | POA: Diagnosis not present

## 2017-10-06 DIAGNOSIS — L03011 Cellulitis of right finger: Secondary | ICD-10-CM | POA: Diagnosis not present

## 2017-10-09 ENCOUNTER — Other Ambulatory Visit: Payer: Self-pay

## 2017-10-09 ENCOUNTER — Encounter: Payer: Self-pay | Admitting: Family Medicine

## 2017-10-09 ENCOUNTER — Ambulatory Visit: Payer: BLUE CROSS/BLUE SHIELD | Admitting: Family Medicine

## 2017-10-09 VITALS — BP 116/86 | HR 90 | Temp 98.7°F | Ht 66.54 in | Wt 187.3 lb

## 2017-10-09 DIAGNOSIS — M79644 Pain in right finger(s): Secondary | ICD-10-CM | POA: Diagnosis not present

## 2017-10-09 NOTE — Patient Instructions (Addendum)
1. Continue with clindamycin 2. Ibuprofen 600mg  PO every 6 hours, warm epson salt soaks for 10 minutes about 3 times a day, trial of ice for 10 min 3 times a day, keep finger/hand elevated 3. If not better or worsening please return for re-evaluation    IF you received an x-ray today, you will receive an invoice from [facility]. Please contact [facility] at [phone] with questions or concerns regarding your invoice.   IF you received labwork today, you will receive an invoice from [facility]. Please contact [facility] at [phone] with questions or concerns regarding your invoice.   Our billing staff will not be able to assist you with questions regarding bills from these companies.  You will be contacted with the lab results as soon as they are available. The fastest way to get your results is to activate your My Chart account. Instructions are located on the last page of this paperwork. If you have not heard from [place] regarding the results in 2 weeks, please contact this office.

## 2017-10-09 NOTE — Progress Notes (Signed)
5/24/20198:41 AM  [name] Short Jul 30, 1970, 47 y.o. female [id number]  Chief Complaint  Patient presents with  . Pain    thinks this injury may have happened while gardening. Having finger pain for 1 wk. [facility]    HPI:   Patient is a 47 y.o. female with past medical history significant for heart valve replacement who presents today for right index finger redness and pain  [facility], diagnosed with a cellulitis and started on clindamycin, she reports no improvement, she has not been trying any conservative measures  She reports that she first noticed redness, swelling and warmth of lateral side of index finger on Sunday Only activity that day was gardening but she denies any injures or bites She denies any FB sensation, she denies any numbness or tingling She is able to move finger normally, she states not as painful though still uncomfortable She denies any fever or chills  Fall Risk  10/09/2017 08/21/2017 03/03/2017  Falls in the past year? No No No     Depression screen [facility] 2/9 10/09/2017 08/21/2017 03/03/2017  Decreased Interest 0 0 0  Down, Depressed, Hopeless 0 0 0  PHQ - 2 Score 0 0 0    Allergies  Allergen Reactions  . Amoxicillin Itching  . Bactroban [Mupirocin] Itching, Swelling and Other (See Comments)    Numbness of upper lip    Prior to Admission medications   Medication Sig Start Date End Date Taking? Authorizing Provider  aspirin EC 81 MG tablet Take 1 tablet (81 mg total) by mouth daily. 03/12/16  Yes Larey Dresser, MD  clindamycin (CLEOCIN) 300 MG capsule Take 300 mg by mouth QID. 10/06/17  Yes [provider]  NORLYDA 0.35 MG tablet  10/06/17  Yes [provider]  NP THYROID 15 MG tablet Take 15 mg by mouth daily. 02/12/16  Yes [provider]  celecoxib (CELEBREX) 100 MG capsule Take 1 capsule (100 mg total) by mouth 2 (two) times daily. 08/21/17    Jaynee Eagles, PA-C    Past Medical History:  Diagnosis Date  . Breast feeding status of mother   . Complication of anesthesia   . Congenital heart defect    bicuspid aortic valve, aortic insufficeincy, ascending aortic aneurysm => s/p AVR (bioprosthetic) and ascending aortic aneurysm repair in 2008  . Family history of adverse reaction to anesthesia    Dad who also had CAD (valve disease) "coded" with anesthesia   . Heart murmur   . History of uterine fibroid   . Hx of cardiac cath    [facility] (2008, pre-surgery) with no coronary artery disease, EF 60% on LV-gram.  . Hx of cardiovascular stress test    ETT-Echo (7/15):  normal  . Hx of echocardiogram    Echo (7/15):  Mod LVH, EF 55-60%, no RWMA, + diast dysfunction, AVR ok (mean 16 mmHg), asc aorta 4 cm, trivial MR  . Hypothyroidism   . Multiple thyroid nodules    H/o goiter with thyroid surgery in 8/13  . PONV (postoperative nausea and vomiting)     Past Surgical History:  Procedure Laterality Date  . AORTIC VALVE REPLACEMENT     with replacement of ascending aorta  . CARDIAC CATHETERIZATION    . CARDIAC VALVE REPLACEMENT    . CESAREAN SECTION  2011  . CHOLECYSTECTOMY N/A 12/20/2015   Procedure: LAPAROSCOPIC CHOLECYSTECTOMY WITH INTRAOPERATIVE CHOLANGIOGRAM;  Surgeon: [name],  MD;  Location: [facility];  Service: General;  Laterality: N/A;  . MYOMECTOMY    . open heart surgery    . THYROID LOBECTOMY  12/31/2011   Procedure: THYROID LOBECTOMY;  Surgeon: Izora Gala, MD;  Location: [place];  Service: ENT;  Laterality: Right;  Substernal Goiter Removal  . THYROID LOBECTOMY  2003    Social History   Tobacco Use  . Smoking status: Never Smoker  . Smokeless tobacco: Never Used  . Tobacco comment: only smoked very infrequently in college  Substance Use Topics  . Alcohol use: Yes    Alcohol/week: 3.0 oz    Types: 5 Standard drinks or equivalent per week    Comment: 1 glass wine per day    Family History  Problem Relation Age of  Onset  . Cancer Mother 1       Gall bladder; found at time gallstone found; smoker  . Hypertension Mother   . Gallstones Mother   . Other Mother        hx of hysterectomy for unspecified reason  . Prostate cancer Father        dx early 31s; not very aggressive; s/p seed implants  . Skin cancer Father        unspecified type; +sun exposure  . Heart Problems Father        same as pt's; s/p surgery  . Goiter Sister   . Hypothyroidism Sister   . Thyroid disease Sister   . Cancer Maternal Grandmother        Gallbladder dx. 58-59; smoker  . Heart disease Maternal Grandfather   . Heart Problems Maternal Grandfather        heart disease  . Heart Problems Paternal Uncle        heart disease, d. 66s; +EtOH abuse  . Other Sister        hx of 1 benign breast biopys  . Ovarian cancer Other        maternal great aunt (MGM's sister); lim info    ROS Per hpi  OBJECTIVE:  Blood pressure 116/86, pulse 90, temperature 98.7 F (37.1 C), temperature source Oral, height 5' 6.54" (1.69 m), weight 187 lb 4.8 oz (85 kg), last menstrual period 09/25/2017, SpO2 99 %.  Physical Exam  Constitutional: She appears well-developed and well-nourished.  Nursing note and vitals reviewed. Right index finger with swelling and erythema along a lateral and upper edge of nail bed. No drainage or warmth. FROM. Minimal TTP. No lacerations or bite marks noted. NVI intact.   ASSESSMENT and PLAN  1. Finger pain, right Discussed with patient continuing with abx, start supportive measures such as soaks, NSAIDs (GI Precautions reviewed) and elevation. Also discussed trial of ice if above do not seem to be helping. Patient to be re-evaluated in 24-48 hours if no improvement or worsening. RTC precautions reviewed. - clindamycin (CLEOCIN) 300 MG capsule; Take 300 mg by mouth QID.   Return if symptoms worsen or fail to improve.    Rutherford Guys, MD [facility] [place], [place]  [address] Ph.  [phone] Fax [phone]

## 2017-10-10 ENCOUNTER — Other Ambulatory Visit: Payer: Self-pay

## 2017-10-10 ENCOUNTER — Encounter: Payer: Self-pay | Admitting: Family Medicine

## 2017-10-10 ENCOUNTER — Ambulatory Visit: Payer: BLUE CROSS/BLUE SHIELD | Admitting: Family Medicine

## 2017-10-10 VITALS — BP 130/88 | HR 92 | Temp 98.7°F | Resp 18 | Ht 66.54 in | Wt 189.6 lb

## 2017-10-10 DIAGNOSIS — M79644 Pain in right finger(s): Secondary | ICD-10-CM

## 2017-10-10 MED ORDER — CLINDAMYCIN PHOSPHATE 1 % EX LOTN: TOPICAL_LOTION | Freq: Two times a day (BID) | CUTANEOUS | 0 refills | Status: DC

## 2017-10-10 NOTE — Patient Instructions (Addendum)
IF you received an x-ray today, you will receive an invoice from [facility]. Please contact [facility] at [phone] with questions or concerns regarding your invoice.   IF you received labwork today, you will receive an invoice from [facility]. Please contact [facility] at [phone] with questions or concerns regarding your invoice.   Our billing staff will not be able to assist you with questions regarding bills from these companies.  You will be contacted with the lab results as soon as they are available. The fastest way to get your results is to activate your My Chart account. Instructions are located on the last page of this paperwork. If you have not heard from [place] regarding the results in 2 weeks, please contact this office.    Paronychia Paronychia is an infection of the skin that surrounds a nail. It usually affects the skin around a fingernail, but it may also occur near a toenail. It often causes pain and swelling around the nail. This condition may come on suddenly or develop over a longer period. In some cases, a collection of pus (abscess) can form near or under the nail. Usually, paronychia is not serious and it clears up with treatment. What are the causes? This condition may be caused by bacteria or fungi. It is commonly caused by either Streptococcus or Staphylococcus bacteria. The bacteria or fungi often cause the infection by getting into the affected area through an opening in the skin, such as a cut or a hangnail. What increases the risk? This condition is more likely to develop in:  People who get their hands wet often, such as those who work as Designer, industrial/product, bartenders, or nurses.  People who bite their fingernails or suck their thumbs.  People who trim their nails too short.  People who have hangnails or injured fingertips.  People who get manicures.  People who have diabetes.  What are the signs or symptoms? Symptoms of this condition  include:  Redness and swelling of the skin near the nail.  Tenderness around the nail when you touch the area.  Pus-filled bumps under the cuticle. The cuticle is the skin at the base or sides of the nail.  Fluid or pus under the nail.  Throbbing pain in the area.  How is this diagnosed? This condition is usually diagnosed with a physical exam. In some cases, a sample of pus may be taken from an abscess to be tested in a lab. This can help to determine what type of bacteria or fungi is causing the condition. How is this treated? Treatment for this condition depends on the cause and severity of the condition. If the condition is mild, it may clear up on its own in a few days. Your health care provider may recommend soaking the affected area in warm water a few times a day. When treatment is needed, the options may include:  Antibiotic medicine, if the condition is caused by a bacterial infection.  Antifungal medicine, if the condition is caused by a fungal infection.  Incision and drainage, if an abscess is present. In this procedure, the health care provider will cut open the abscess so the pus can drain out.  Follow these instructions at home:  Soak the affected area in warm water if directed to do so by your health care provider. You may be told to do this for 20 minutes, 2-3 times a day. Keep the area dry in between soakings.  Take medicines only as directed by  your health care provider.  If you were prescribed an antibiotic medicine, finish all of it even if you start to feel better.  Keep the affected area clean.  Do not try to drain a fluid-filled bump yourself.  If you will be washing dishes or performing other tasks that require your hands to get wet, wear rubber gloves. You should also wear gloves if your hands might come in contact with irritating substances, such as cleaners or chemicals.  Follow your health care provider's instructions about: ? Wound care. ? Bandage  (dressing) changes and removal. Contact a health care provider if:  Your symptoms get worse or do not improve with treatment.  You have a fever or chills.  You have redness spreading from the affected area.  You have continued or increased fluid, blood, or pus coming from the affected area.  Your finger or knuckle becomes swollen or is difficult to move. This information is not intended to replace advice given to you by your health care provider. Make sure you discuss any questions you have with your health care provider. Document Released: 10/29/2000 Document Revised: 10/11/2015 Document Reviewed: 04/12/2014 Elsevier Interactive Patient Education  [employer].

## 2017-10-10 NOTE — Progress Notes (Signed)
Chief Complaint  Patient presents with  . right index finger f/u from 10/09/17    seen yesterday at 104 and told to return today if not better.  Per pt took ibuprofen 600 mg q 6 hrs on yesterday w/o relief no ibuprofen today.  Pain level 3/10    HPI  Right index finger with soreness along the lateral edge Still on clindamycin Not worse but not better Wanted to have it looked at No fevers or chills  Past Medical History:  Diagnosis Date  . Breast feeding status of mother   . Complication of anesthesia   . Congenital heart defect    bicuspid aortic valve, aortic insufficeincy, ascending aortic aneurysm => s/p AVR (bioprosthetic) and ascending aortic aneurysm repair in 2008  . Family history of adverse reaction to anesthesia    Dad who also had CAD (valve disease) "coded" with anesthesia   . Heart murmur   . History of uterine fibroid   . Hx of cardiac cath    [facility] (2008, pre-surgery) with no coronary artery disease, EF 60% on LV-gram.  . Hx of cardiovascular stress test    ETT-Echo (7/15):  normal  . Hx of echocardiogram    Echo (7/15):  Mod LVH, EF 55-60%, no RWMA, + diast dysfunction, AVR ok (mean 16 mmHg), asc aorta 4 cm, trivial MR  . Hypothyroidism   . Multiple thyroid nodules    H/o goiter with thyroid surgery in 8/13  . PONV (postoperative nausea and vomiting)     Current Outpatient Medications  Medication Sig Dispense Refill  . aspirin EC 81 MG tablet Take 1 tablet (81 mg total) by mouth daily.    . clindamycin (CLEOCIN) 300 MG capsule Take 300 mg by mouth QID.  0  . NORLYDA 0.35 MG tablet   11  . NP THYROID 15 MG tablet Take 15 mg by mouth daily.  1  . clindamycin (CLEOCIN-T) 1 % lotion Apply topically 2 (two) times daily. 60 mL 0   No current facility-administered medications for this visit.     Allergies:  Allergies  Allergen Reactions  . Amoxicillin Itching  . Bactroban [Mupirocin] Itching, Swelling and Other (See Comments)    Numbness of upper lip     Past Surgical History:  Procedure Laterality Date  . AORTIC VALVE REPLACEMENT     with replacement of ascending aorta  . CARDIAC CATHETERIZATION    . CARDIAC VALVE REPLACEMENT    . CESAREAN SECTION  2011  . CHOLECYSTECTOMY N/A 12/20/2015   Procedure: LAPAROSCOPIC CHOLECYSTECTOMY WITH INTRAOPERATIVE CHOLANGIOGRAM;  Surgeon: [name], MD;  Location: [facility];  Service: General;  Laterality: N/A;  . MYOMECTOMY    . open heart surgery    . THYROID LOBECTOMY  12/31/2011   Procedure: THYROID LOBECTOMY;  Surgeon: Izora Gala, MD;  Location: [facility];  Service: ENT;  Laterality: Right;  Substernal Goiter Removal  . THYROID LOBECTOMY  2003    Social History   Socioeconomic History  . Marital status: Married    Spouse name: Not on file  . Number of children: Not on file  . Years of education: Not on file  . Highest education level: Not on file  Occupational History  . Not on file  Social Needs  . Financial resource strain: Not on file  . Food insecurity:    Worry: Not on file    Inability: Not on file  . Transportation needs:    Medical: Not on file    Non-medical:  Not on file  Tobacco Use  . Smoking status: Never Smoker  . Smokeless tobacco: Never Used  . Tobacco comment: only smoked very infrequently in college  Substance and Sexual Activity  . Alcohol use: Yes    Alcohol/week: 3.0 oz    Types: 5 Standard drinks or equivalent per week    Comment: 1 glass wine per day  . Drug use: No  . Sexual activity: Not on file  Lifestyle  . Physical activity:    Days per week: Not on file    Minutes per session: Not on file  . Stress: Not on file  Relationships  . Social connections:    Talks on phone: Not on file    Gets together: Not on file    Attends religious service: Not on file    Active member of club or organization: Not on file    Attends meetings of clubs or organizations: Not on file    Relationship status: Not on file  Other Topics Concern  . Not on file  Social  History Narrative  . Not on file    Family History  Problem Relation Age of Onset  . Cancer Mother 55       Gall bladder; found at time gallstone found; smoker  . Hypertension Mother   . Gallstones Mother   . Other Mother        hx of hysterectomy for unspecified reason  . Prostate cancer Father        dx early 45s; not very aggressive; s/p seed implants  . Skin cancer Father        unspecified type; +sun exposure  . Heart Problems Father        same as pt's; s/p surgery  . Goiter Sister   . Hypothyroidism Sister   . Thyroid disease Sister   . Cancer Maternal Grandmother        Gallbladder dx. 58-59; smoker  . Heart disease Maternal Grandfather   . Heart Problems Maternal Grandfather        heart disease  . Heart Problems Paternal Uncle        heart disease, d. 77s; +EtOH abuse  . Other Sister        hx of 1 benign breast biopys  . Ovarian cancer Other        maternal great aunt (MGM's sister); lim info     ROS Review of Systems See HPI Constitution: No fevers or chills No malaise No diaphoresis Skin: No rash or itching, see hpi Eyes: no blurry vision, no double vision GU: no dysuria or hematuria Neuro: no dizziness or headaches all others reviewed and negative   Objective: Vitals:   10/10/17 1551  BP: 130/88  Pulse: 92  Resp: 18  Temp: 98.7 F (37.1 C)  TempSrc: Oral  SpO2: 100%  Weight: 189 lb 9.6 oz (86 kg)  Height: 5' 6.54" (1.69 m)    Physical Exam  Constitutional: She is oriented to person, place, and time. She appears well-developed and well-nourished.  HENT:  Head: Normocephalic and atraumatic.  Pulmonary/Chest: Effort normal.  Neurological: She is alert and oriented to person, place, and time.  Skin: Skin is warm. Capillary refill takes less than 2 seconds.  Cuticle edge towards the thumb with erythema No fluctuance Blanches One area of yellow color   Psychiatric: She has a normal mood and affect. Her behavior is normal. Judgment and  thought content normal.    Assessment and Plan Marlowe was seen  today for right index finger f/u from 10/09/17.  Diagnoses and all orders for this visit:  Finger pain, right  Other orders -     clindamycin (CLEOCIN-T) 1 % lotion; Apply topically 2 (two) times daily.   Continue soaks Continue clindamycin oral Added topical clinda Follow up prn    Gaven Eugene A Nolon Rod

## 2017-10-14 DIAGNOSIS — L03011 Cellulitis of right finger: Secondary | ICD-10-CM | POA: Diagnosis not present

## 2017-10-14 DIAGNOSIS — L0889 Other specified local infections of the skin and subcutaneous tissue: Secondary | ICD-10-CM | POA: Diagnosis not present

## 2017-10-23 DIAGNOSIS — M25571 Pain in right ankle and joints of right foot: Secondary | ICD-10-CM | POA: Diagnosis not present

## 2018-02-23 ENCOUNTER — Other Ambulatory Visit (HOSPITAL_COMMUNITY): Payer: BLUE CROSS/BLUE SHIELD

## 2018-02-26 DIAGNOSIS — D2271 Melanocytic nevi of right lower limb, including hip: Secondary | ICD-10-CM | POA: Diagnosis not present

## 2018-02-26 DIAGNOSIS — D225 Melanocytic nevi of trunk: Secondary | ICD-10-CM | POA: Diagnosis not present

## 2018-02-26 DIAGNOSIS — D2261 Melanocytic nevi of right upper limb, including shoulder: Secondary | ICD-10-CM | POA: Diagnosis not present

## 2018-02-26 DIAGNOSIS — D2262 Melanocytic nevi of left upper limb, including shoulder: Secondary | ICD-10-CM | POA: Diagnosis not present

## 2018-02-26 DIAGNOSIS — L281 Prurigo nodularis: Secondary | ICD-10-CM | POA: Diagnosis not present

## 2018-02-26 DIAGNOSIS — D485 Neoplasm of uncertain behavior of skin: Secondary | ICD-10-CM | POA: Diagnosis not present

## 2018-04-02 DIAGNOSIS — M542 Cervicalgia: Secondary | ICD-10-CM | POA: Diagnosis not present

## 2018-04-02 DIAGNOSIS — M546 Pain in thoracic spine: Secondary | ICD-10-CM | POA: Diagnosis not present

## 2018-04-07 DIAGNOSIS — M6281 Muscle weakness (generalized): Secondary | ICD-10-CM | POA: Diagnosis not present

## 2018-04-07 DIAGNOSIS — M62830 Muscle spasm of back: Secondary | ICD-10-CM | POA: Diagnosis not present

## 2018-04-13 DIAGNOSIS — M6281 Muscle weakness (generalized): Secondary | ICD-10-CM | POA: Diagnosis not present

## 2018-04-13 DIAGNOSIS — M62830 Muscle spasm of back: Secondary | ICD-10-CM | POA: Diagnosis not present

## 2018-04-19 DIAGNOSIS — M62830 Muscle spasm of back: Secondary | ICD-10-CM | POA: Diagnosis not present

## 2018-04-19 DIAGNOSIS — M6281 Muscle weakness (generalized): Secondary | ICD-10-CM | POA: Diagnosis not present

## 2018-04-21 DIAGNOSIS — M62830 Muscle spasm of back: Secondary | ICD-10-CM | POA: Diagnosis not present

## 2018-04-21 DIAGNOSIS — M6281 Muscle weakness (generalized): Secondary | ICD-10-CM | POA: Diagnosis not present

## 2018-04-22 ENCOUNTER — Ambulatory Visit: Payer: BLUE CROSS/BLUE SHIELD | Admitting: Cardiovascular Disease

## 2018-04-23 ENCOUNTER — Other Ambulatory Visit: Payer: Self-pay

## 2018-04-23 ENCOUNTER — Ambulatory Visit: Payer: BLUE CROSS/BLUE SHIELD | Admitting: Emergency Medicine

## 2018-04-23 ENCOUNTER — Encounter: Payer: Self-pay | Admitting: Emergency Medicine

## 2018-04-23 VITALS — BP 111/75 | HR 80 | Temp 98.6°F | Ht 66.25 in | Wt 190.8 lb

## 2018-04-23 DIAGNOSIS — H9202 Otalgia, left ear: Secondary | ICD-10-CM | POA: Diagnosis not present

## 2018-04-23 DIAGNOSIS — H60502 Unspecified acute noninfective otitis externa, left ear: Secondary | ICD-10-CM | POA: Diagnosis not present

## 2018-04-23 MED ORDER — CIPROFLOXACIN HCL 500 MG PO TABS: 500.0000 mg | ORAL_TABLET | Freq: Two times a day (BID) | ORAL | 0 refills | Status: AC

## 2018-04-23 MED ORDER — HYDROCORTISONE-ACETIC ACID 1-2 % OT SOLN: 3.0000 [drp] | Freq: Three times a day (TID) | OTIC | 1 refills | Status: DC

## 2018-04-23 NOTE — Patient Instructions (Addendum)
     If you have lab work done today you will be contacted with your lab results within the next 2 weeks.  If you have not heard from [place] then please contact us. The fastest way to get your results is to register for My Chart.   IF you received an x-ray today, you will receive an invoice from [facility]. Please contact [facility] at [phone] with questions or concerns regarding your invoice.   IF you received labwork today, you will receive an invoice from [facility]. Please contact [facility] at [phone] with questions or concerns regarding your invoice.   Our billing staff will not be able to assist you with questions regarding bills from these companies.  You will be contacted with the lab results as soon as they are available. The fastest way to get your results is to activate your My Chart account. Instructions are located on the last page of this paperwork. If you have not heard from [place] regarding the results in 2 weeks, please contact this office.     Otitis Externa Otitis externa is an infection of the outer ear canal. The outer ear canal is the area between the outside of the ear and the eardrum. Otitis externa is sometimes called "swimmer's ear." Follow these instructions at home:  If you were given antibiotic ear drops, use them as told by your doctor. Do not stop using them even if your condition gets better.  Take over-the-counter and prescription medicines only as told by your doctor.  Keep all follow-up visits as told by your doctor. This is important. How is this prevented?  Keep your ear dry. Use the corner of a towel to dry your ear after you swim or bathe.  Try not to scratch or put things in your ear. Doing these things makes it easier for germs to grow in your ear.  Avoid swimming in lakes, dirty water, or pools that may not have the right amount of a chemical called chlorine.  Consider making ear drops and putting 3 or 4 drops in each ear  after you swim. Ask your doctor about how you can make ear drops. Contact a doctor if:  You have a fever.  After 3 days your ear is still red, swollen, or painful.  After 3 days you still have pus coming from your ear.  Your redness, swelling, or pain gets worse.  You have a really bad headache.  You have redness, swelling, pain, or tenderness behind your ear. This information is not intended to replace advice given to you by your health care provider. Make sure you discuss any questions you have with your health care provider. Document Released: 10/22/2007 Document Revised: 05/31/2015 Document Reviewed: 02/12/2015 Elsevier Interactive Patient Education  [employer].

## 2018-04-23 NOTE — Progress Notes (Signed)
[name] Short 47 y.o.   Chief Complaint  Patient presents with  . Ear Pain    LEFT x 2 days    HISTORY OF PRESENT ILLNESS: This is a 47 y.o. female complaining of left ear pain for the last 2 days.  Denies injury.  Denies any other significant symptoms  HPI   Prior to Admission medications   Medication Sig Start Date End Date Taking? Authorizing Provider  aspirin EC 81 MG tablet Take 1 tablet (81 mg total) by mouth daily. 03/12/16  Yes Larey Dresser, MD  NORLYDA 0.35 MG tablet  10/06/17  Yes [provider]  NP THYROID 15 MG tablet Take 15 mg by mouth daily. 02/12/16  Yes [provider]    Allergies  Allergen Reactions  . Amoxicillin Itching  . Bactroban [Mupirocin] Itching, Swelling and Other (See Comments)    Numbness of upper lip    Patient Active Problem List   Diagnosis Date Noted  . History of bicuspid aortic valve 03/13/2017  . Bilateral hearing loss due to cerumen impaction 03/03/2017  . Family history of cancer of gallbladder 12/12/2015  . S/P AVR 06/22/2012  . Ascending aortic aneurysm ([place]) 06/22/2012  . Aneurysm of aortic root ([place]) 01/06/2012  . Nontoxic multinodular goiter 01/06/2012  . 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 Breast feeding status of mother   . Complication of anesthesia   . Congenital heart defect    bicuspid aortic valve, aortic insufficeincy, ascending aortic aneurysm => s/p AVR (bioprosthetic) and ascending aortic aneurysm repair in 2008  . Family history of adverse reaction to anesthesia    Dad who also had CAD (valve disease) "coded" with anesthesia   . Heart murmur   . History of uterine fibroid   . Hx of cardiac cath    [facility] (2008, pre-surgery) with no coronary artery disease, EF 60% on LV-gram.  . Hx of cardiovascular stress test    ETT-Echo (7/15):  normal  . Hx of echocardiogram    Echo (7/15):  Mod LVH, EF 55-60%, no RWMA, + diast dysfunction, AVR ok (mean 16  mmHg), asc aorta 4 cm, trivial MR  . Hypothyroidism   . Multiple thyroid nodules    H/o goiter with thyroid surgery in 8/13  . PONV (postoperative nausea and vomiting)     Past Surgical History:  Procedure Laterality Date  . AORTIC VALVE REPLACEMENT     with replacement of ascending aorta  . CARDIAC CATHETERIZATION    . CARDIAC VALVE REPLACEMENT    . CESAREAN SECTION  2011  . CHOLECYSTECTOMY N/A 12/20/2015   Procedure: LAPAROSCOPIC CHOLECYSTECTOMY WITH INTRAOPERATIVE CHOLANGIOGRAM;  Surgeon: [name], MD;  Location: [place];  Service: General;  Laterality: N/A;  . MYOMECTOMY    . open heart surgery    . THYROID LOBECTOMY  12/31/2011   Procedure: THYROID LOBECTOMY;  Surgeon: Izora Gala, MD;  Location: [place];  Service: ENT;  Laterality: Right;  Substernal Goiter Removal  . THYROID LOBECTOMY  2003    Social History   Socioeconomic History  . Marital status: Married    Spouse name: Not on file  . Number of children: Not on file  . Years of education: Not on file  . Highest education level: Not on file  Occupational History  . Not on file  Social Needs  . Financial resource strain: Not on file  . Food insecurity:    Worry: Not on file  Inability: Not on file  . Transportation needs:    Medical: Not on file    Non-medical: Not on file  Tobacco Use  . Smoking status: Never Smoker  . Smokeless tobacco: Never Used  . Tobacco comment: only smoked very infrequently in college  Substance and Sexual Activity  . Alcohol use: Yes    Alcohol/week: 5.0 standard drinks    Types: 5 Standard drinks or equivalent per week    Comment: 1 glass wine per day  . Drug use: No  . Sexual activity: Not on file  Lifestyle  . Physical activity:    Days per week: Not on file    Minutes per session: Not on file  . Stress: Not on file  Relationships  . Social connections:    Talks on phone: Not on file    Gets together: Not on file    Attends religious service: Not on file    Active  member of club or organization: Not on file    Attends meetings of clubs or organizations: Not on file    Relationship status: Not on file  . Intimate partner violence:    Fear of current or ex partner: Not on file    Emotionally abused: Not on file    Physically abused: Not on file    Forced sexual activity: Not on file  Other Topics Concern  . Not on file  Social History Narrative  . Not on file    Family History  Problem Relation Age of Onset  . Cancer Mother 85       Gall bladder; found at time gallstone found; smoker  . Hypertension Mother   . Gallstones Mother   . Other Mother        hx of hysterectomy for unspecified reason  . Prostate cancer Father        dx early 50s; not very aggressive; s/p seed implants  . Skin cancer Father        unspecified type; +sun exposure  . Heart Problems Father        same as pt's; s/p surgery  . Goiter Sister   . Hypothyroidism Sister   . Thyroid disease Sister   . Cancer Maternal Grandmother        Gallbladder dx. 58-59; smoker  . Heart disease Maternal Grandfather   . Heart Problems Maternal Grandfather        heart disease  . Heart Problems Paternal Uncle        heart disease, d. 53s; +EtOH abuse  . Other Sister        hx of 1 benign breast biopys  . Ovarian cancer Other        maternal great aunt (MGM's sister); lim info     Review of Systems  Constitutional: Negative.  Negative for chills and fever.  HENT: Positive for ear pain. Negative for sore throat.   Eyes: Negative.  Negative for discharge and redness.  Respiratory: Negative.  Negative for cough and shortness of breath.   Cardiovascular: Negative.  Negative for chest pain and palpitations.  Gastrointestinal: Negative.  Negative for abdominal pain, diarrhea, nausea and vomiting.  Genitourinary: Negative.  Negative for flank pain and hematuria.  Musculoskeletal: Negative.  Negative for back pain, joint pain and neck pain.  Skin: Negative for rash.  Neurological:  Negative.  Negative for dizziness and headaches.  Endo/Heme/Allergies: Negative.   All other systems reviewed and are negative.  Vitals:   04/23/18 1558  BP: 111/75  Pulse: 80  Temp: 98.6 F (37 C)     Physical Exam  Constitutional: She is oriented to person, place, and time. She appears well-developed and well-nourished.  HENT:  Head: Normocephalic and atraumatic.  Right Ear: External ear normal.  Left Ear: External ear normal.  Nose: Nose normal.  Mouth/Throat: Oropharynx is clear and moist.  Left ear: Tender external canal with erythema and swelling.  TM normal Right ear: Within normal limits  Eyes: Pupils are equal, round, and reactive to light. Conjunctivae and EOM are normal.  Neck: Normal range of motion. Neck supple.  Cardiovascular: Normal rate.  Pulmonary/Chest: Effort normal.  Lymphadenopathy:    She has no cervical adenopathy.  Neurological: She is alert and oriented to person, place, and time.  Skin: Skin is warm and dry.  Psychiatric: She has a normal mood and affect. Her behavior is normal.  Vitals reviewed.    ASSESSMENT & PLAN: [name] Short was seen today for ear pain.  Diagnoses and all orders for this visit:  Acute otalgia, left -     acetic acid-hydrocortisone (VOSOL-HC) OTIC solution; Place 3 drops into the left ear 3 (three) times daily. -     ciprofloxacin (CIPRO) 500 MG tablet; Take 1 tablet (500 mg total) by mouth 2 (two) times daily for 7 days.  Acute otitis externa of left ear, unspecified type -     acetic acid-hydrocortisone (VOSOL-HC) OTIC solution; Place 3 drops into the left ear 3 (three) times daily. -     ciprofloxacin (CIPRO) 500 MG tablet; Take 1 tablet (500 mg total) by mouth 2 (two) times daily for 7 days.    Patient Instructions       If you have lab work done today you will be contacted with your lab results within the next 2 weeks.  If you have not heard from [place] then please contact us. The fastest way to get your results is  to register for My Chart.   IF you received an x-ray today, you will receive an invoice from [facility]. Please contact [facility] at [phone] with questions or concerns regarding your invoice.   IF you received labwork today, you will receive an invoice from [place]. Please contact [facility] at [phone] with questions or concerns regarding your invoice.   Our billing staff will not be able to assist you with questions regarding bills from these companies.  You will be contacted with the lab results as soon as they are available. The fastest way to get your results is to activate your My Chart account. Instructions are located on the last page of this paperwork. If you have not heard from [place] regarding the results in 2 weeks, please contact this office.     Otitis Externa Otitis externa is an infection of the outer ear canal. The outer ear canal is the area between the outside of the ear and the eardrum. Otitis externa is sometimes called "swimmer's ear." Follow these instructions at home:  If you were given antibiotic ear drops, use them as told by your doctor. Do not stop using them even if your condition gets better.  Take over-the-counter and prescription medicines only as told by your doctor.  Keep all follow-up visits as told by your doctor. This is important. How is this prevented?  Keep your ear dry. Use the corner of a towel to dry your ear after you swim or bathe.  Try not to scratch or put things in your ear. Doing these things makes  it easier for germs to grow in your ear.  Avoid swimming in lakes, dirty water, or pools that may not have the right amount of a chemical called chlorine.  Consider making ear drops and putting 3 or 4 drops in each ear after you swim. Ask your doctor about how you can make ear drops. Contact a doctor if:  You have a fever.  After 3 days your ear is still red, swollen, or painful.  After 3 days you still have pus  coming from your ear.  Your redness, swelling, or pain gets worse.  You have a really bad headache.  You have redness, swelling, pain, or tenderness behind your ear. This information is not intended to replace advice given to you by your health care provider. Make sure you discuss any questions you have with your health care provider. Document Released: 10/22/2007 Document Revised: 05/31/2015 Document Reviewed: 02/12/2015 Elsevier Interactive Patient Education  2018 Elsevier Inc.      Agustina Caroli, MD [facility]

## 2018-04-26 DIAGNOSIS — M62830 Muscle spasm of back: Secondary | ICD-10-CM | POA: Diagnosis not present

## 2018-04-26 DIAGNOSIS — M6281 Muscle weakness (generalized): Secondary | ICD-10-CM | POA: Diagnosis not present

## 2018-04-27 ENCOUNTER — Other Ambulatory Visit: Payer: Self-pay | Admitting: *Deleted

## 2018-04-27 ENCOUNTER — Ambulatory Visit: Payer: Self-pay | Admitting: *Deleted

## 2018-04-27 DIAGNOSIS — H60502 Unspecified acute noninfective otitis externa, left ear: Secondary | ICD-10-CM

## 2018-04-27 DIAGNOSIS — H9202 Otalgia, left ear: Secondary | ICD-10-CM

## 2018-04-27 MED ORDER — HYDROCORTISONE-ACETIC ACID 1-2 % OT SOLN
3.0000 [drp] | Freq: Three times a day (TID) | OTIC | 1 refills | Status: DC
Start: 2018-04-27 — End: 2019-08-01

## 2018-04-27 NOTE — Progress Notes (Signed)
Pt requested acetic acid-hydrocortisone OTIC solution to be sent to [id number]  in [place] instead of [facility]. [facility]

## 2018-04-27 NOTE — Telephone Encounter (Signed)
Unable to reach patient, regarding changing pharmacies for the acetic acid-hydrocortisone OTI solution. Left message that the medication was resent.

## 2018-04-28 DIAGNOSIS — M62830 Muscle spasm of back: Secondary | ICD-10-CM | POA: Diagnosis not present

## 2018-04-28 DIAGNOSIS — M6281 Muscle weakness (generalized): Secondary | ICD-10-CM | POA: Diagnosis not present

## 2018-04-29 ENCOUNTER — Encounter: Payer: Self-pay | Admitting: Cardiovascular Disease

## 2018-04-29 ENCOUNTER — Ambulatory Visit: Payer: BLUE CROSS/BLUE SHIELD | Admitting: Cardiovascular Disease

## 2018-04-29 VITALS — BP 124/64 | HR 87 | Ht 66.25 in | Wt 196.0 lb

## 2018-04-29 DIAGNOSIS — I712 Thoracic aortic aneurysm, without rupture: Secondary | ICD-10-CM

## 2018-04-29 DIAGNOSIS — E785 Hyperlipidemia, unspecified: Secondary | ICD-10-CM | POA: Diagnosis not present

## 2018-04-29 DIAGNOSIS — Z13220 Encounter for screening for lipoid disorders: Secondary | ICD-10-CM | POA: Diagnosis not present

## 2018-04-29 DIAGNOSIS — Z952 Presence of prosthetic heart valve: Secondary | ICD-10-CM | POA: Diagnosis not present

## 2018-04-29 DIAGNOSIS — I7121 Aneurysm of the ascending aorta, without rupture: Secondary | ICD-10-CM

## 2018-04-29 NOTE — Progress Notes (Addendum)
Cardiology Office Note   Date:  05/07/2018   ID:  [name] Short, DOB 1971-04-18, MRN [id number]  PCP:  Patient, No Pcp Per  Cardiologist:   Skeet Latch, MD   No chief complaint on file.    History of Present Illness: [name] Short is a 47 y.o. female with a bicuspid aortic valve and aortic aneysm s/p bioprosthetic AVR who is here for follow up. She was previously a patient of Dr. Aundra Dubin and then transferred to Dr. Saunders Revel.  She will now establish in our office after Dr. Saunders Revel transitioned to [place].  [name] Short had a bicuspid aortic valve and developed severe aortic regurgitation and an ascending aortic aneurysm.  In 2008 she underwent bioprosthetic aortic valve replacement and supracoronary ascending aorta replacement.  Her last MRA 03/2017 showed a mild ascending aneurysm 4.2 cm, unchanged from prior.  She had an echo 12/2015 that showed LVEF 60-65% and normal diastolic function.  Mean gradient across the bioprosthetic valve was 16 mmHg.    [name] Short has been feeling well and works out regularly.  She does kick boxing and other cardio without chest pain or shortness of breath.  She has no lower extremity edema, orthopnea or PND.  She denies chest pain or shortness of breath.  She works as a Print production planner.     Past Medical History:  Diagnosis Date  . Breast feeding status of mother   . Complication of anesthesia   . Congenital heart defect    bicuspid aortic valve, aortic insufficeincy, ascending aortic aneurysm => s/p AVR (bioprosthetic) and ascending aortic aneurysm repair in 2008  . Family history of adverse reaction to anesthesia    Dad who also had CAD (valve disease) "coded" with anesthesia   . Heart murmur   . History of uterine fibroid   . Hx of cardiac cath    [facility] (2008, pre-surgery) with no coronary artery disease, EF 60% on LV-gram.  . Hx of cardiovascular stress test    ETT-Echo (7/15):  normal  . Hx of echocardiogram    Echo (7/15):  Mod LVH, EF 55-60%, no RWMA,  + diast dysfunction, AVR ok (mean 16 mmHg), asc aorta 4 cm, trivial MR  . Hypothyroidism   . Multiple thyroid nodules    H/o goiter with thyroid surgery in 8/13  . PONV (postoperative nausea and vomiting)     Past Surgical History:  Procedure Laterality Date  . AORTIC VALVE REPLACEMENT     with replacement of ascending aorta  . CARDIAC CATHETERIZATION    . CARDIAC VALVE REPLACEMENT    . CESAREAN SECTION  2011  . CHOLECYSTECTOMY N/A 12/20/2015   Procedure: LAPAROSCOPIC CHOLECYSTECTOMY WITH INTRAOPERATIVE CHOLANGIOGRAM;  Surgeon: [name], MD;  Location: [place];  Service: General;  Laterality: N/A;  . MYOMECTOMY    . open heart surgery    . THYROID LOBECTOMY  12/31/2011   Procedure: THYROID LOBECTOMY;  Surgeon: Izora Gala, MD;  Location: [place];  Service: ENT;  Laterality: Right;  Substernal Goiter Removal  . THYROID LOBECTOMY  2003     Current Outpatient Medications  Medication Sig Dispense Refill  . acetic acid-hydrocortisone (VOSOL-HC) OTIC solution Place 3 drops into the left ear 3 (three) times daily. 10 mL 1  . aspirin EC 81 MG tablet Take 1 tablet (81 mg total) by mouth daily.    . NORLYDA 0.35 MG tablet   11  . NP THYROID 15 MG tablet Take 15 mg by mouth daily.  1   No current facility-administered medications for this visit.     Allergies:   Amoxicillin and Mupirocin    Social History:  The patient  reports that she has never smoked. She has never used smokeless tobacco. She reports current alcohol use of about 5.0 standard drinks of alcohol per week. She reports that she does not use drugs.   Family History:  The patient's family history includes Cancer in her maternal grandmother; Cancer (age of onset: 24) in her mother; Gallstones in her mother; Goiter in her sister; Heart Problems in her father, maternal grandfather, and paternal uncle; Heart disease in her maternal grandfather; Hypertension in her mother; Hypothyroidism in her sister; Other in her mother and  sister; Ovarian cancer in an other family member; Prostate cancer in her father; Skin cancer in her father; Thyroid disease in her sister.    ROS:  Please see the history of present illness.   Otherwise, review of systems are positive for menorrhagia.   All other systems are reviewed and negative.    PHYSICAL EXAM: VS:  BP 124/64   Pulse 87   Ht 5' 6.25" (1.683 m)   Wt 196 lb (88.9 kg)   LMP 04/02/2018   SpO2 99%   BMI 31.40 kg/m  , BMI Body mass index is 31.4 kg/m. GENERAL:  Well appearing HEENT:  Pupils equal round and reactive, fundi not visualized, oral mucosa unremarkable NECK:  No jugular venous distention, waveform within normal limits, carotid upstroke brisk and symmetric, no bruits LUNGS:  Clear to auscultation bilaterally HEART:  RRR.  PMI not displaced or sustained,S1 and S2 within normal limits, no S3, no S4, no clicks, no rubs, II/VI systolic murmur at the LUSB ABD:  Flat, positive bowel sounds normal in frequency in pitch, no bruits, no rebound, no guarding, no midline pulsatile mass, no hepatomegaly, no splenomegaly EXT:  2 plus pulses throughout, no edema, no cyanosis no clubbing SKIN:  No rashes no nodules NEURO:  Cranial nerves II through XII grossly intact, motor grossly intact throughout PSYCH:  Cognitively intact, oriented to person place and time   EKG:  EKG is not ordered today.  Echo 12/18/15: Study Conclusions  - Left ventricle: The cavity size was normal. Wall thickness was   increased in a pattern of mild LVH. Systolic function was normal.   The estimated ejection fraction was in the range of 60% to 65%.   Wall motion was normal; there were no regional wall motion   abnormalities. Left ventricular diastolic function parameters   were normal. - Aortic valve: A bioprosthesis was present. There was no   regurgitation. Peak velocity (S): 276 cm/s. Mean gradient (S): 16   mm Hg. - Mitral valve: Transvalvular velocity was within the normal range.   There  was no evidence for stenosis. There was trivial   regurgitation. - Left atrium: The atrium was mildly to moderately dilated. - Right ventricle: The cavity size was normal. Wall thickness was   normal. Systolic function was normal. - Atrial septum: No defect or patent foramen ovale was identified   by color flow Doppler. - Tricuspid valve: There was trivial regurgitation. - Pulmonary arteries: Systolic pressure was within the normal   range. PA peak pressure: 25 mm Hg (S).  Impressions:  - Bioprosthetic aortic valve with mildly elevated gradients but   unchanged from 11/2013.  MRA 03/25/17:  IMPRESSION: VASCULAR  1. Stable postsurgical changes of aortic valve and aortic root replacement without evidence of complication. 2.  Stable mild aneurysmal dilatation of the residual native ascending thoracic aorta with a maximal diameter of 4.2 cm.   Recent Labs: No results found for requested labs within last 8760 hours.    Lipid Panel    Component Value Date/Time   CHOL 208 (H) 03/12/2016 1548   TRIG 151 (H) 03/12/2016 1548   HDL 48 03/12/2016 1548   CHOLHDL 4.3 03/12/2016 1548   VLDL 30 03/12/2016 1548   LDLCALC 130 (H) 03/12/2016 1548      Wt Readings from Last 3 Encounters:  04/29/18 196 lb (88.9 kg)  04/23/18 190 lb 12.8 oz (86.5 kg)  10/10/17 189 lb 9.6 oz (86 kg)      ASSESSMENT AND PLAN:  # Ascending aorta aneurysm: # Bicuspid aortic valve s/p aortic valve replacement: Stable on echo in 2017 and MRA in 2018.  She has no symptoms and is feeling well. No evidence of heart failure on exam.  Repeat echo to assess valve and ascending aorta.  4.2-4.3 cm previously.   # Hyperlipidemia: No recent lipids on file.  She will return for fasting labs.     Current medicines are reviewed at length with the patient today.  The patient does not have concerns regarding medicines.  The following changes have been made:  no change  Labs/ tests ordered today include:    Orders Placed This Encounter  Procedures  . Lipid panel  . Comprehensive metabolic panel  . ECHOCARDIOGRAM COMPLETE     Disposition:   FU with Daija Routson C. Oval Linsey, MD, [facility] in 1 year.     Signed, Kaveri Perras C. Oval Linsey, MD, [facility]  05/07/2018 2:24 PM    [place]

## 2018-04-29 NOTE — Patient Instructions (Addendum)
Medication Instructions:  Your physician recommends that you continue on your current medications as directed. Please refer to the Current Medication list given to you today.  If you need a refill on your cardiac medications before your next appointment, please call your pharmacy.   Lab work: FASTING LP/CMET SOON   If you have labs (blood work) drawn today and your tests are completely normal, you will receive your results only by: Marland Kitchen MyChart Message (if you have MyChart) OR . A paper copy in the mail If you have any lab test that is abnormal or we need to change your treatment, we will call you to review the results.  Testing/Procedures: Your physician has requested that you have an echocardiogram. Echocardiography is a painless test that uses sound waves to create images of your heart. It provides your doctor with information about the size and shape of your heart and how well your heart's chambers and valves are working. This procedure takes approximately one hour. There are no restrictions for this procedure. IN January  [facility] [address]   Follow-Up: At [facility], you and your health needs are our priority.  As part of our continuing mission to provide you with exceptional heart care, we have created designated Provider Care Teams.  These Care Teams include your primary Cardiologist (physician) and Advanced Practice Providers (APPs -  Physician Assistants and Nurse Practitioners) who all work together to provide you with the care you need, when you need it. You will need a follow up appointment in 12 months.  Please call our office 2 months in advance to schedule this appointment.  You may see DR [facility] or one of the following Advanced Practice Providers on your designated Care Team:   Kerin Ransom, PA-C Roby Lofts, [place] . Sande Rives, PA-C

## 2018-05-03 DIAGNOSIS — M6281 Muscle weakness (generalized): Secondary | ICD-10-CM | POA: Diagnosis not present

## 2018-05-03 DIAGNOSIS — M62830 Muscle spasm of back: Secondary | ICD-10-CM | POA: Diagnosis not present

## 2018-05-06 DIAGNOSIS — M62830 Muscle spasm of back: Secondary | ICD-10-CM | POA: Diagnosis not present

## 2018-05-06 DIAGNOSIS — M6281 Muscle weakness (generalized): Secondary | ICD-10-CM | POA: Diagnosis not present

## 2018-05-07 ENCOUNTER — Encounter: Payer: Self-pay | Admitting: Cardiovascular Disease

## 2018-05-24 DIAGNOSIS — Z23 Encounter for immunization: Secondary | ICD-10-CM | POA: Diagnosis not present

## 2018-05-26 ENCOUNTER — Other Ambulatory Visit: Payer: Self-pay

## 2018-05-26 ENCOUNTER — Ambulatory Visit (HOSPITAL_COMMUNITY): Payer: BLUE CROSS/BLUE SHIELD | Attending: Cardiology

## 2018-05-26 DIAGNOSIS — Z952 Presence of prosthetic heart valve: Secondary | ICD-10-CM | POA: Insufficient documentation

## 2018-05-26 DIAGNOSIS — I712 Thoracic aortic aneurysm, without rupture: Secondary | ICD-10-CM | POA: Diagnosis not present

## 2018-05-26 DIAGNOSIS — I7121 Aneurysm of the ascending aorta, without rupture: Secondary | ICD-10-CM

## 2018-06-08 ENCOUNTER — Telehealth: Payer: Self-pay | Admitting: *Deleted

## 2018-06-08 NOTE — Telephone Encounter (Signed)
Spoke with patient and she stated she would return soon for labs

## 2018-06-08 NOTE — Telephone Encounter (Signed)
When patient was seen in December she was to have fasting LP/CMET soon. She has not had, left message to call back

## 2018-06-17 DIAGNOSIS — Z13220 Encounter for screening for lipoid disorders: Secondary | ICD-10-CM | POA: Diagnosis not present

## 2018-06-17 DIAGNOSIS — E785 Hyperlipidemia, unspecified: Secondary | ICD-10-CM | POA: Diagnosis not present

## 2018-06-17 LAB — COMPREHENSIVE METABOLIC PANEL
A/G RATIO: 1.9 (ref 1.2–2.2)
ALK PHOS: 59 IU/L (ref 39–117)
ALT: 13 IU/L (ref 0–32)
AST: 15 IU/L (ref 0–40)
Albumin: 4.4 g/dL (ref 3.8–4.8)
BILIRUBIN TOTAL: 0.4 mg/dL (ref 0.0–1.2)
BUN/Creatinine Ratio: 12 (ref 9–23)
BUN: 11 mg/dL (ref 6–24)
CHLORIDE: 103 mmol/L (ref 96–106)
CO2: 20 mmol/L (ref 20–29)
Calcium: 9.4 mg/dL (ref 8.7–10.2)
Creatinine, Ser: 0.9 mg/dL (ref 0.57–1.00)
GFR calc Af Amer: 87 mL/min/{1.73_m2} (ref >59)
GFR calc non Af Amer: 75 mL/min/{1.73_m2} (ref >59)
Globulin, Total: 2.3 g/dL (ref 1.5–4.5)
Glucose: 86 mg/dL (ref 65–99)
POTASSIUM: 4.9 mmol/L (ref 3.5–5.2)
Sodium: 138 mmol/L (ref 134–144)
Total Protein: 6.7 g/dL (ref 6.0–8.5)

## 2018-06-17 LAB — LIPID PANEL
CHOL/HDL RATIO: 4.2 ratio (ref 0.0–4.4)
Cholesterol, Total: 202 mg/dL — ABNORMAL HIGH (ref 100–199)
HDL: 48 mg/dL (ref >39)
LDL CALC: 136 mg/dL — AB (ref 0–99)
Triglycerides: 91 mg/dL (ref 0–149)
VLDL CHOLESTEROL CAL: 18 mg/dL (ref 5–40)

## 2019-01-25 ENCOUNTER — Other Ambulatory Visit: Payer: Self-pay | Admitting: Obstetrics and Gynecology

## 2019-01-25 DIAGNOSIS — R928 Other abnormal and inconclusive findings on diagnostic imaging of breast: Secondary | ICD-10-CM

## 2019-01-28 ENCOUNTER — Other Ambulatory Visit: Payer: Self-pay | Admitting: Obstetrics and Gynecology

## 2019-01-28 ENCOUNTER — Ambulatory Visit
Admission: RE | Admit: 2019-01-28 | Discharge: 2019-01-28 | Disposition: A | Discharge status: Home / Self Care | Payer: 59 | Source: Ambulatory Visit | Attending: Obstetrics and Gynecology | Admitting: Obstetrics and Gynecology

## 2019-01-28 ENCOUNTER — Other Ambulatory Visit: Payer: Self-pay

## 2019-01-28 DIAGNOSIS — R928 Other abnormal and inconclusive findings on diagnostic imaging of breast: Secondary | ICD-10-CM

## 2019-01-28 DIAGNOSIS — R921 Mammographic calcification found on diagnostic imaging of breast: Secondary | ICD-10-CM

## 2019-06-22 ENCOUNTER — Ambulatory Visit: Payer: 59 | Admitting: Cardiovascular Disease

## 2019-06-28 ENCOUNTER — Ambulatory Visit: Payer: 59 | Admitting: Cardiovascular Disease

## 2019-06-28 ENCOUNTER — Other Ambulatory Visit: Payer: Self-pay

## 2019-06-28 ENCOUNTER — Encounter: Payer: Self-pay | Admitting: Cardiovascular Disease

## 2019-06-28 VITALS — BP 132/88 | HR 78 | Temp 98.1°F | Ht 67.0 in | Wt 206.6 lb

## 2019-06-28 DIAGNOSIS — Z13220 Encounter for screening for lipoid disorders: Secondary | ICD-10-CM

## 2019-06-28 DIAGNOSIS — Z8774 Personal history of (corrected) congenital malformations of heart and circulatory system: Secondary | ICD-10-CM

## 2019-06-28 DIAGNOSIS — I712 Thoracic aortic aneurysm, without rupture, unspecified: Secondary | ICD-10-CM

## 2019-06-28 DIAGNOSIS — E785 Hyperlipidemia, unspecified: Secondary | ICD-10-CM | POA: Diagnosis not present

## 2019-06-28 DIAGNOSIS — I7121 Aneurysm of the ascending aorta, without rupture: Secondary | ICD-10-CM

## 2019-06-28 DIAGNOSIS — Z01812 Encounter for preprocedural laboratory examination: Secondary | ICD-10-CM | POA: Diagnosis not present

## 2019-06-28 NOTE — Progress Notes (Signed)
Cardiology Office Note   Date:  07/15/2019   ID:  [name] Short, [name] Short 03-Nov-1970, MRN [id number]  PCP:  Patient, No Pcp Per  Cardiologist:   [name] Latch, MD   No chief complaint on file.    History of Present Illness: [name] Short is a 49 y.o. female with a bicuspid aortic valve and aortic aneysm s/p bioprosthetic AVR who is here for follow up. She was previously a patient of Dr. Aundra Short and then transferred to Dr. Saunders Short.  She will now establish in our office after Dr. Saunders Short transitioned to [name] Short.  [name] had a bicuspid aortic valve and developed severe aortic regurgitation and an ascending aortic aneurysm.  In 2008 she underwent bioprosthetic aortic valve replacement and supracoronary ascending aorta replacement.  Her last MRA 03/2017 showed a mild ascending aneurysm 4.2 cm, unchanged from prior.  She had an echo 12/2015 that showed LVEF 60-65% and normal diastolic function.  Mean gradient across the bioprosthetic valve was 16 mmHg.    Since her last appointment [name] has been struggling with the pandemic.  She feels well physically but has been stressed.  She is home with her husband and 1-year-old daughter all day.  She continues to exercise by walking 3 days/week for 3 miles.  This takes her about an hour.  She also does kickboxing from home 2 or 3 days/week.  She feels good with exercise and has no exertional chest pain or shortness of breath.  However she still struggles with weight gain and anxiety.  She has not experienced any lower extremity edema, orthopnea, or PND.    Past Medical History:  Diagnosis Date  . Breast feeding status of mother   . Complication of anesthesia   . Congenital heart defect    bicuspid aortic valve, aortic insufficeincy, ascending aortic aneurysm => s/p AVR (bioprosthetic) and ascending aortic aneurysm repair in 2008  . Family history of adverse reaction to anesthesia    Dad who also had CAD (valve disease) "coded" with anesthesia     . Heart murmur   . History of uterine fibroid   . Hx of cardiac cath    [facility] (2008, pre-surgery) with no coronary artery disease, EF 60% on LV-gram.  . Hx of cardiovascular stress test    ETT-Echo (7/15):  normal  . Hx of echocardiogram    Echo (7/15):  Mod LVH, EF 55-60%, no RWMA, + diast dysfunction, AVR ok (mean 16 mmHg), asc aorta 4 cm, trivial MR  . Hypothyroidism   . Multiple thyroid nodules    H/o goiter with thyroid surgery in 8/13  . PONV (postoperative nausea and vomiting)     Past Surgical History:  Procedure Laterality Date  . AORTIC VALVE REPLACEMENT     with replacement of ascending aorta  . CARDIAC CATHETERIZATION    . CARDIAC VALVE REPLACEMENT    . CESAREAN SECTION  2011  . CHOLECYSTECTOMY N/A 12/20/2015   Procedure: LAPAROSCOPIC CHOLECYSTECTOMY WITH INTRAOPERATIVE CHOLANGIOGRAM;  Surgeon: [name], MD;  Location: [place];  Service: General;  Laterality: N/A;  . MYOMECTOMY    . open heart surgery    . THYROID LOBECTOMY  12/31/2011   Procedure: THYROID LOBECTOMY;  Surgeon: Izora Gala, MD;  Location: [place];  Service: ENT;  Laterality: Right;  Substernal Goiter Removal  . THYROID LOBECTOMY  2003     Current Outpatient Medications  Medication Sig Dispense Refill  . acetic acid-hydrocortisone (VOSOL-HC) OTIC solution Place 3 drops into the  left ear 3 (three) times daily. 10 mL 1  . aspirin EC 81 MG tablet Take 1 tablet (81 mg total) by mouth daily.    . NORLYDA 0.35 MG tablet   11  . NP THYROID 15 MG tablet Take 15 mg by mouth daily.  1   No current facility-administered medications for this visit.    Allergies:   Amoxicillin and Mupirocin    Social History:  The patient  reports that she has never smoked. She has never used smokeless tobacco. She reports current alcohol use of about 5.0 standard drinks of alcohol per week. She reports that she does not use drugs.   Family History:  The patient's family history includes Cancer in her maternal grandmother;  Cancer (age of onset: 52) in her mother; Gallstones in her mother; Goiter in her sister; Heart Problems in her father, maternal grandfather, and paternal uncle; Heart disease in her maternal grandfather; Hypertension in her mother; Hypothyroidism in her sister; Other in her mother and sister; Ovarian cancer in an other family member; Prostate cancer in her father; Skin cancer in her father; Thyroid disease in her sister.    ROS:  Please see the history of present illness.   Otherwise, review of systems are positive for menorrhagia.   All other systems are reviewed and negative.    PHYSICAL EXAM: VS:  BP 132/88   Pulse 78   Temp 98.1 F (36.7 C)   Ht 5\' 7"  (1.702 m)   Wt 206 lb 9.6 oz (93.7 kg)   SpO2 99%   BMI 32.36 kg/m  , BMI Body mass index is 32.36 kg/m. GENERAL:  Well appearing HEENT:  Pupils equal round and reactive, fundi not visualized, oral mucosa unremarkable NECK:  No jugular venous distention, waveform within normal limits, carotid upstroke brisk and symmetric, no bruits LUNGS:  Clear to auscultation bilaterally HEART:  RRR.  PMI not displaced or sustained,S1 and S2 within normal limits, no S3, no S4, no clicks, no rubs, II/VI systolic murmur at the LUSB ABD:  Flat, positive bowel sounds normal in frequency in pitch, no bruits, no rebound, no guarding, no midline pulsatile mass, no hepatomegaly, no splenomegaly EXT:  2 plus pulses throughout, no edema, no cyanosis no clubbing SKIN:  No rashes no nodules NEURO:  Cranial nerves II through XII grossly intact, motor grossly intact throughout PSYCH:  Cognitively intact, oriented to person place and time   EKG:  EKG is ordered today. 06/28/19: Sinus rhythm.  Rate 78 bpm  Echo 12/18/15: Study Conclusions  - Left ventricle: The cavity size was normal. Wall thickness was   increased in a pattern of mild LVH. Systolic function was normal.   The estimated ejection fraction was in the range of 60% to 65%.   Wall motion was normal;  there were no regional wall motion   abnormalities. Left ventricular diastolic function parameters   were normal. - Aortic valve: A bioprosthesis was present. There was no   regurgitation. Peak velocity (S): 276 cm/s. Mean gradient (S): 16   mm Hg. - Mitral valve: Transvalvular velocity was within the normal range.   There was no evidence for stenosis. There was trivial   regurgitation. - Left atrium: The atrium was mildly to moderately dilated. - Right ventricle: The cavity size was normal. Wall thickness was   normal. Systolic function was normal. - Atrial septum: No defect or patent foramen ovale was identified   by color flow Doppler. - Tricuspid valve: There was trivial  regurgitation. - Pulmonary arteries: Systolic pressure was within the normal   range. PA peak pressure: 25 mm Hg (S).  Impressions:  - Bioprosthetic aortic valve with mildly elevated gradients but   unchanged from 11/2013.  MRA 03/25/17:  IMPRESSION: VASCULAR  1. Stable postsurgical changes of aortic valve and aortic root replacement without evidence of complication. 2. Stable mild aneurysmal dilatation of the residual native ascending thoracic aorta with a maximal diameter of 4.2 cm.  Echo 05/2018: Study Conclusions   - Left ventricle: The cavity size was normal. There was mild focal  basal hypertrophy of the septum. Systolic function was normal.  The estimated ejection fraction was in the range of 60% to 65%.  Wall motion was normal; there were no regional wall motion  abnormalities. Left ventricular diastolic function parameters  were normal.  - Aortic valve: A prosthesis was present and functioning normally.  The prosthesis had a normal range of motion. The sewing ring  appeared normal, had no rocking motion, and showed no evidence of  dehiscence. Mean gradient (S): 17 mm Hg. Peak gradient (S): 36 mm  Hg. Valve area (VTI): 1.02 cm^2. Valve area (Vmax): 0.94 cm^2.  Valve area  (Vmean): 1.04 cm^2.   Impressions:   - S/P AVR. Gradient similar to prior. Normal LV systolic and  diastolic function.   Recent Labs: No results found for requested labs within last 8760 hours.    Lipid Panel    Component Value Date/Time   CHOL 202 (H) 06/17/2018 0847   TRIG 91 06/17/2018 0847   HDL 48 06/17/2018 0847   CHOLHDL 4.2 06/17/2018 0847   CHOLHDL 4.3 03/12/2016 1548   VLDL 30 03/12/2016 1548   LDLCALC 136 (H) 06/17/2018 0847      Wt Readings from Last 3 Encounters:  06/28/19 206 lb 9.6 oz (93.7 kg)  04/29/18 196 lb (88.9 kg)  04/23/18 190 lb 12.8 oz (86.5 kg)     ASSESSMENT AND PLAN:  # Ascending aorta aneurysm: # Bicuspid aortic valve s/p aortic valve replacement: Stable on echo in 2020 and MRA in 2018.  She has no symptoms and is feeling well. No evidence of heart failure on exam.  Ascending aorta 4.2-4.3 cm previously. We will repeat MRA of the chest 08/2019.  She will need Xanax prior.  # Hyperlipidemia: ASCVD 10 year risk <10%, but lipids were little elevated last year and she has gained weight and is less active.  Repeat lipids and CMP.  # Elevated BP: Blood pressure is mildly elevated today.  It was a little better on repeat.  She attributes this to stress and weight.  She is going to work on weight loss and track her blood pressures at home.   Current medicines are reviewed at length with the patient today.  The patient does not have concerns regarding medicines.  The following changes have been made:  no change  Labs/ tests ordered today include:   Orders Placed This Encounter  Procedures  . MR ANGIO CHEST W WO CONTRAST  . Lipid panel  . Comprehensive metabolic panel  . EKG 12-Lead     Disposition:   FU with Zophia Marrone C. Oval Linsey, MD, [facility] in 1 month.    Signed, Madisyn Mawhinney C. Oval Linsey, MD, [facility]  07/15/2019 4:02 PM    [facility]

## 2019-06-28 NOTE — Patient Instructions (Signed)
Medication Instructions:  Your physician recommends that you continue on your current medications as directed. Please refer to the Current Medication list given to you today.  *If you need a refill on your cardiac medications before your next appointment, please call your pharmacy*  Lab Work: FASTING LP/CMET 1 [place]  If you have labs (blood work) drawn today and your tests are completely normal, you will receive your results only by: Marland Kitchen MyChart Message (if you have MyChart) OR . A paper copy in the mail If you have any lab test that is abnormal or we need to change your treatment, we will call you to review the results.  Testing/Procedures: MRA OF CHEST   Follow-Up: At [facility], you and your health needs are our priority.  As part of our continuing mission to provide you with exceptional heart care, we have created designated Provider Care Teams.  These Care Teams include your primary Cardiologist (physician) and Advanced Practice Providers (APPs -  Physician Assistants and Nurse Practitioners) who all work together to provide you with the care you need, when you need it.  Your next appointment:   1 month(s)  The format for your next appointment:   Virtual Visit   Provider:   You may see DR [facility]  or one of the following Advanced Practice Providers on your designated Care Team:    Kerin Ransom, PA-C  [place], [place]  Coletta Memos, [place]   Other Instructions  MONITOR AND LOG YOUR BLOOD PRESSURE TWICE A DAY. HAVE THE LOG AVAILABLE AT YOUR FOLLOW UP VISIT 1 MONTH

## 2019-07-29 ENCOUNTER — Other Ambulatory Visit: Payer: 59

## 2019-08-01 ENCOUNTER — Telehealth (INDEPENDENT_AMBULATORY_CARE_PROVIDER_SITE_OTHER): Payer: 59 | Admitting: Cardiovascular Disease

## 2019-08-01 ENCOUNTER — Encounter: Payer: Self-pay | Admitting: Cardiovascular Disease

## 2019-08-01 VITALS — BP 108/76 | HR 68 | Ht 67.0 in | Wt 194.0 lb

## 2019-08-01 DIAGNOSIS — I7121 Aneurysm of the ascending aorta, without rupture: Secondary | ICD-10-CM

## 2019-08-01 DIAGNOSIS — Z8774 Personal history of (corrected) congenital malformations of heart and circulatory system: Secondary | ICD-10-CM | POA: Diagnosis not present

## 2019-08-01 DIAGNOSIS — I712 Thoracic aortic aneurysm, without rupture: Secondary | ICD-10-CM

## 2019-08-01 DIAGNOSIS — E785 Hyperlipidemia, unspecified: Secondary | ICD-10-CM

## 2019-08-01 DIAGNOSIS — Z952 Presence of prosthetic heart valve: Secondary | ICD-10-CM

## 2019-08-01 NOTE — Patient Instructions (Addendum)
Medication Instructions:  Your physician recommends that you continue on your current medications as directed. Please refer to the Current Medication list given to you today.  *If you need a refill on your cardiac medications before your next appointment, please call your pharmacy*  Lab Work: FASTING LP/CMET 1 WEEK BEFORE MRA   If you have labs (blood work) drawn today and your tests are completely normal, you will receive your results only by: Marland Kitchen MyChart Message (if you have MyChart) OR . A paper copy in the mail If you have any lab test that is abnormal or we need to change your treatment, we will call you to review the results.   Testing/Procedures: NONE  Follow-Up: At [facility], you and your health needs are our priority.  As part of our continuing mission to provide you with exceptional heart care, we have created designated Provider Care Teams.  These Care Teams include your primary Cardiologist (physician) and Advanced Practice Providers (APPs -  Physician Assistants and Nurse Practitioners) who all work together to provide you with the care you need, when you need it.  We recommend signing up for the patient portal called "MyChart".  Sign up information is provided on this After Visit Summary.  MyChart is used to connect with patients for Virtual Visits (Telemedicine).  Patients are able to view lab/test results, encounter notes, upcoming appointments, etc.  Non-urgent messages can be sent to your provider as well.   To learn more about what you can do with MyChart, go to [email or web address].    Your next appointment:   12 month(s)  You will receive a reminder letter in the mail two months in advance. If you don't receive a letter, please call our office to schedule the follow-up appointment.  The format for your next appointment:   In Person  Provider:   You may see Skeet Latch, MD or one of the following Advanced Practice Providers on your designated Care Team:     Kerin Ransom, PA-C  [place], [place]  Coletta Memos, [place]  Other Instructions  Endocrinologist: Dr. Pecola Lawless

## 2019-08-01 NOTE — Progress Notes (Signed)
Virtual Visit via Telephone Note   This visit type was conducted due to national recommendations for restrictions regarding the COVID-19 Pandemic (e.g. social distancing) in an effort to limit this patient's exposure and mitigate transmission in our community.  Due to her co-morbid illnesses, this patient is at least at moderate risk for complications without adequate follow up.  This format is felt to be most appropriate for this patient at this time.  The patient did not have access to video technology/had technical difficulties with video requiring transitioning to audio format only (telephone).  All issues noted in this document were discussed and addressed.  No physical exam could be performed with this format.  Please refer to the patient's chart for her  consent to telehealth for [facility].   The patient was identified using 2 identifiers.  Date:  08/01/2019   ID:  [name] Short, DOB Feb 10, 1971, MRN [id number]  Patient Location: Home Provider Location: Office  PCP:  Patient, No Pcp Per  Cardiologist:  [name] Latch, MD  Electrophysiologist:  None   Evaluation Performed:  Follow-Up Visit  Chief Complaint:  Hypertension  History of Present Illness:     The patient does not have symptoms concerning for COVID-19 infection (fever, chills, cough, or new shortness of breath).   [name] Short is a 49 y.o. female with a bicuspid aortic valve and aortic aneysm s/p bioprosthetic AVR who is here for follow up. She was previously a patient of Dr. Aundra Dubin and then transferred to Dr. Saunders Revel.  She will now establish in our office after Dr. Saunders Revel transitioned to Maish Vaya.  [name] had a bicuspid aortic valve and developed severe aortic regurgitation and an ascending aortic aneurysm.  In 2008 she underwent bioprosthetic aortic valve replacement and supracoronary ascending aorta replacement.  Her last MRA 03/2017 showed a mild ascending aneurysm 4.2 cm, unchanged from prior.  She  had an echo 12/2015 that showed LVEF 60-65% and normal diastolic function.  Mean gradient across the bioprosthetic valve was 16 mmHg.    At her last appointment [name] BP was mildly elevated.   In retrospect, she thinks that seeing her weight led to her blood pressure being elevated.  She also had a lot of stressors at home.  Since then she has been tracking her blood pressure.  It has been very well-controlled.  It is ranged from the 90s to 110s over 70s to 80s.  She noticed that she was getting very stressed about checking her blood pressure and was not breathing.  Since she started breathing while checking her blood pressure she has not had any more elevated diastolic pressures.  Overall she has been feeling well.  She continues to get regular exercise and has lost 9 pounds since her last appointment.  She has been more conscientious about her diet and avoiding overeating.  Overall she feels well and is without complaint today.   Past Medical History:  Diagnosis Date  . Breast feeding status of mother   . Complication of anesthesia   . Congenital heart defect    bicuspid aortic valve, aortic insufficeincy, ascending aortic aneurysm => s/p AVR (bioprosthetic) and ascending aortic aneurysm repair in 2008  . Family history of adverse reaction to anesthesia    Dad who also had CAD (valve disease) "coded" with anesthesia   . Heart murmur   . History of uterine fibroid   . Hx of cardiac cath    [facility] (2008, pre-surgery) with no coronary artery disease,  EF 60% on LV-gram.  . Hx of cardiovascular stress test    ETT-Echo (7/15):  normal  . Hx of echocardiogram    Echo (7/15):  Mod LVH, EF 55-60%, no RWMA, + diast dysfunction, AVR ok (mean 16 mmHg), asc aorta 4 cm, trivial MR  . Hypothyroidism   . Multiple thyroid nodules    H/o goiter with thyroid surgery in 8/13  . PONV (postoperative nausea and vomiting)     Past Surgical History:  Procedure Laterality Date  . AORTIC VALVE REPLACEMENT      with replacement of ascending aorta  . CARDIAC CATHETERIZATION    . CARDIAC VALVE REPLACEMENT    . CESAREAN SECTION  2011  . CHOLECYSTECTOMY N/A 12/20/2015   Procedure: LAPAROSCOPIC CHOLECYSTECTOMY WITH INTRAOPERATIVE CHOLANGIOGRAM;  Surgeon: [name], MD;  Location: [place];  Service: General;  Laterality: N/A;  . MYOMECTOMY    . open heart surgery    . THYROID LOBECTOMY  12/31/2011   Procedure: THYROID LOBECTOMY;  Surgeon: Izora Gala, MD;  Location: [place];  Service: ENT;  Laterality: Right;  Substernal Goiter Removal  . THYROID LOBECTOMY  2003     Current Outpatient Medications  Medication Sig Dispense Refill  . aspirin EC 81 MG tablet Take 1 tablet (81 mg total) by mouth daily.    . NORLYDA 0.35 MG tablet Take by mouth daily.   11   No current facility-administered medications for this visit.    Allergies:   Amoxicillin and Mupirocin    Social History:  The patient  reports that she has never smoked. She has never used smokeless tobacco. She reports current alcohol use of about 5.0 standard drinks of alcohol per week. She reports that she does not use drugs.   Family History:  The patient's family history includes Cancer in her maternal grandmother; Cancer (age of onset: 31) in her mother; Gallstones in her mother; Goiter in her sister; Heart Problems in her father, maternal grandfather, and paternal uncle; Heart disease in her maternal grandfather; Hypertension in her mother; Hypothyroidism in her sister; Other in her mother and sister; Ovarian cancer in an other family member; Prostate cancer in her father; Skin cancer in her father; Thyroid disease in her sister.    ROS:  Please see the history of present illness.   Otherwise, review of systems are positive for menorrhagia.   All other systems are reviewed and negative.    PHYSICAL EXAM: VS:  BP 108/76   Pulse 68   Ht 5\' 7"  (1.702 m)   Wt 194 lb (88 kg)   BMI 30.38 kg/m  , BMI Body mass index is 30.38 kg/m. GENERAL:  Sounds well LUNGS: Respirations unlabored NEURO: Speech fluent PSYCH:  Cognitively intact, oriented to person place and time   EKG:  EKG is not ordered today. 06/28/19: Sinus rhythm.  Rate 78 bpm  Echo 12/18/15: Study Conclusions  - Left ventricle: The cavity size was normal. Wall thickness was   increased in a pattern of mild LVH. Systolic function was normal.   The estimated ejection fraction was in the range of 60% to 65%.   Wall motion was normal; there were no regional wall motion   abnormalities. Left ventricular diastolic function parameters   were normal. - Aortic valve: A bioprosthesis was present. There was no   regurgitation. Peak velocity (S): 276 cm/s. Mean gradient (S): 16   mm Hg. - Mitral valve: Transvalvular velocity was within the normal range.   There was no  evidence for stenosis. There was trivial   regurgitation. - Left atrium: The atrium was mildly to moderately dilated. - Right ventricle: The cavity size was normal. Wall thickness was   normal. Systolic function was normal. - Atrial septum: No defect or patent foramen ovale was identified   by color flow Doppler. - Tricuspid valve: There was trivial regurgitation. - Pulmonary arteries: Systolic pressure was within the normal   range. PA peak pressure: 25 mm Hg (S).  Impressions:  - Bioprosthetic aortic valve with mildly elevated gradients but   unchanged from 11/2013.  MRA 03/25/17:  IMPRESSION: VASCULAR  1. Stable postsurgical changes of aortic valve and aortic root replacement without evidence of complication. 2. Stable mild aneurysmal dilatation of the residual native ascending thoracic aorta with a maximal diameter of 4.2 cm.  Echo 05/2018: Study Conclusions   - Left ventricle: The cavity size was normal. There was mild focal  basal hypertrophy of the septum. Systolic function was normal.  The estimated ejection fraction was in the range of 60% to 65%.  Wall motion was normal; there  were no regional wall motion  abnormalities. Left ventricular diastolic function parameters  were normal.  - Aortic valve: A prosthesis was present and functioning normally.  The prosthesis had a normal range of motion. The sewing ring  appeared normal, had no rocking motion, and showed no evidence of  dehiscence. Mean gradient (S): 17 mm Hg. Peak gradient (S): 36 mm  Hg. Valve area (VTI): 1.02 cm^2. Valve area (Vmax): 0.94 cm^2.  Valve area (Vmean): 1.04 cm^2.   Impressions:   - S/P AVR. Gradient similar to prior. Normal LV systolic and  diastolic function.   Recent Labs: No results found for requested labs within last 8760 hours.    Lipid Panel    Component Value Date/Time   CHOL 202 (H) 06/17/2018 0847   TRIG 91 06/17/2018 0847   HDL 48 06/17/2018 0847   CHOLHDL 4.2 06/17/2018 0847   CHOLHDL 4.3 03/12/2016 1548   VLDL 30 03/12/2016 1548   LDLCALC 136 (H) 06/17/2018 0847      Wt Readings from Last 3 Encounters:  08/01/19 194 lb (88 kg)  06/28/19 206 lb 9.6 oz (93.7 kg)  04/29/18 196 lb (88.9 kg)     ASSESSMENT AND PLAN:  # Ascending aorta aneurysm: # Bicuspid aortic valve s/p aortic valve replacement: Stable on echo in 2020 and MRA in 2018.  She has no symptoms and is feeling well. Ascending aorta 4.2-4.3 cm previously. We will repeat MRA of the chest 08/2019.  She will need Xanax prior.  # Hyperlipidemia: ASCVD 10 year risk <10%, but lipids were little elevated last year and she has gained weight and is less active.  She will have lipids and CMP checked prior to her MRA.  # Elevated BP: Blood pressure was elevated in clinic but has been well-controlled at home.  Continue with exercise and dietary changes.  Goal blood pressure less than 130/80.   COVID-19 Education: The signs and symptoms of COVID-19 were discussed with the patient and how to seek care for testing (follow up with PCP or arrange E-visit).  The importance of social distancing was  discussed today.  Time:   Today, I have spent 15 minutes with the patient with telehealth technology discussing the above problems.    Current medicines are reviewed at length with the patient today.  The patient does not have concerns regarding medicines.  The following changes have been made:  no  change  Labs/ tests ordered today include:   Orders Placed This Encounter  Procedures  . Lipid panel  . Comprehensive metabolic panel     Disposition:   FU with Christo Hain C. Oval Linsey, MD, [facility] in 1 year    Signed, Mindy Gali C. Oval Linsey, MD, [facility]  08/01/2019 10:24 AM    [place]

## 2019-08-02 ENCOUNTER — Other Ambulatory Visit: Payer: Self-pay

## 2019-08-02 ENCOUNTER — Ambulatory Visit: Payer: 59

## 2019-08-02 ENCOUNTER — Ambulatory Visit
Admission: RE | Admit: 2019-08-02 | Discharge: 2019-08-02 | Disposition: A | Discharge status: Home / Self Care | Payer: 59 | Source: Ambulatory Visit | Attending: Obstetrics and Gynecology | Admitting: Obstetrics and Gynecology

## 2019-08-02 ENCOUNTER — Other Ambulatory Visit: Payer: Self-pay | Admitting: Obstetrics and Gynecology

## 2019-08-02 DIAGNOSIS — R921 Mammographic calcification found on diagnostic imaging of breast: Secondary | ICD-10-CM

## 2019-08-23 LAB — COMPREHENSIVE METABOLIC PANEL
ALT: 12 IU/L (ref 0–32)
AST: 19 IU/L (ref 0–40)
Albumin/Globulin Ratio: 2 (ref 1.2–2.2)
Albumin: 4.4 g/dL (ref 3.8–4.8)
Alkaline Phosphatase: 66 IU/L (ref 39–117)
BUN/Creatinine Ratio: 11 (ref 9–23)
BUN: 9 mg/dL (ref 6–24)
Bilirubin Total: 0.3 mg/dL (ref 0.0–1.2)
CO2: 23 mmol/L (ref 20–29)
Calcium: 9.4 mg/dL (ref 8.7–10.2)
Chloride: 104 mmol/L (ref 96–106)
Creatinine, Ser: 0.79 mg/dL (ref 0.57–1.00)
GFR calc Af Amer: 101 mL/min/{1.73_m2} (ref >59)
GFR calc non Af Amer: 88 mL/min/{1.73_m2} (ref >59)
Globulin, Total: 2.2 g/dL (ref 1.5–4.5)
Glucose: 86 mg/dL (ref 65–99)
Potassium: 4.5 mmol/L (ref 3.5–5.2)
Sodium: 142 mmol/L (ref 134–144)
Total Protein: 6.6 g/dL (ref 6.0–8.5)

## 2019-08-23 LAB — LIPID PANEL
Chol/HDL Ratio: 4.9 ratio — ABNORMAL HIGH (ref 0.0–4.4)
Cholesterol, Total: 187 mg/dL (ref 100–199)
HDL: 38 mg/dL — ABNORMAL LOW (ref >39)
LDL Chol Calc (NIH): 123 mg/dL — ABNORMAL HIGH (ref 0–99)
Triglycerides: 146 mg/dL (ref 0–149)
VLDL Cholesterol Cal: 26 mg/dL (ref 5–40)

## 2019-08-26 ENCOUNTER — Other Ambulatory Visit: Payer: Self-pay | Admitting: *Deleted

## 2019-08-26 MED ORDER — ALPRAZOLAM 0.25 MG PO TABS: ORAL_TABLET | ORAL | 0 refills | Status: DC

## 2019-08-26 NOTE — Telephone Encounter (Signed)
Patient called in requesting Rx for Xanax prior to MRI next week Per Dr Oval Linsey ok for Xanax 0.25 mg prior to procedure  Called to [facility], mychart message to patient

## 2019-08-30 ENCOUNTER — Ambulatory Visit (HOSPITAL_COMMUNITY)
Admission: RE | Admit: 2019-08-30 | Discharge: 2019-08-30 | Disposition: A | Discharge status: Home / Self Care | Payer: 59 | Source: Ambulatory Visit | Attending: Cardiovascular Disease | Admitting: Cardiovascular Disease

## 2019-08-30 ENCOUNTER — Other Ambulatory Visit: Payer: Self-pay

## 2019-08-30 DIAGNOSIS — I712 Thoracic aortic aneurysm, without rupture: Secondary | ICD-10-CM | POA: Diagnosis present

## 2019-08-30 DIAGNOSIS — I7121 Aneurysm of the ascending aorta, without rupture: Secondary | ICD-10-CM

## 2019-08-30 MED ORDER — GADOBUTROL 1 MMOL/ML IV SOLN
8.5000 mL | Freq: Once | INTRAVENOUS | Status: AC | PRN
  Administered 2019-08-30: 8.5 mL via INTRAVENOUS

## 2019-10-13 ENCOUNTER — Telehealth: Payer: Self-pay

## 2019-10-13 ENCOUNTER — Other Ambulatory Visit: Payer: Self-pay | Admitting: Gastroenterology

## 2019-10-13 NOTE — Telephone Encounter (Signed)
   [facility] Pre-operative Risk Assessment    HEARTCARE STAFF: - Please ensure there is not already an duplicate clearance open for this procedure. - Under Visit Info/Reason for Call, type in Other and utilize the format Clearance MM/DD/YY or Clearance TBD. Do not use dashes or single digits. - If request is for dental extraction, please clarify the # of teeth to be extracted.  Request for surgical clearance:  1. What type of surgery is being performed? COLONOSCOPY   2. When is this surgery scheduled? 11-17-2019   3. What type of clearance is required (medical clearance vs. Pharmacy clearance to hold med vs. Both)? BOTH  4. Are there any medications that need to be held prior to surgery and how long? ASA   5. Practice name and name of physician performing surgery? [facility] ATTN:Liliya   6. What is the office phone number? [phone]   7.   What is the office fax number? [phone]  8.   Anesthesia type (None, local, MAC, general) ? PROPOFOL

## 2019-10-16 NOTE — Telephone Encounter (Signed)
OK to hold aspirin.  

## 2019-10-18 NOTE — Telephone Encounter (Signed)
Left message with patient to call back and speak to the on call preop APP

## 2019-10-19 NOTE — Telephone Encounter (Signed)
Unable to reach the patient today

## 2019-10-26 NOTE — Telephone Encounter (Signed)
Left message to call back and ask to speak with the pre-op team. 

## 2019-10-27 ENCOUNTER — Telehealth: Payer: Self-pay | Admitting: General Practice

## 2019-10-27 NOTE — Telephone Encounter (Signed)
Follow up   Patient is returning call from preop. Please call.

## 2019-10-27 NOTE — Telephone Encounter (Signed)
Doing well at this time.  No cardiac complaints.  Patient asked about SBE prophylaxis prior to colonoscopy.  Message left with valve team for further clarification.

## 2019-10-27 NOTE — Telephone Encounter (Signed)
° °  Primary Cardiologist: Skeet Latch, MD  Chart reviewed as part of pre-operative protocol coverage. Given past medical history and time since last visit, based on ACC/AHA guidelines, [name] Short would be at acceptable risk for the planned procedure without further cardiovascular testing.   She may hold her aspirin for 7 days prior to her procedure.  Please resume as soon as hemostasis is achieved.  I will route this recommendation to the requesting party via Epic fax function and remove from pre-op pool.  Please call with questions.  Jossie Ng. Jill Stopka NP-C    10/27/2019, 12:18 PM [place] [address] Office [phone] Fax [phone]

## 2019-11-14 ENCOUNTER — Other Ambulatory Visit (HOSPITAL_COMMUNITY)
Admission: RE | Admit: 2019-11-14 | Discharge: 2019-11-14 | Disposition: A | Discharge status: Home / Self Care | Payer: 59 | Source: Ambulatory Visit | Attending: Gastroenterology | Admitting: Gastroenterology

## 2019-11-14 DIAGNOSIS — Z20822 Contact with and (suspected) exposure to covid-19: Secondary | ICD-10-CM | POA: Insufficient documentation

## 2019-11-14 DIAGNOSIS — Z01812 Encounter for preprocedural laboratory examination: Secondary | ICD-10-CM | POA: Insufficient documentation

## 2019-11-14 LAB — SARS CORONAVIRUS 2 (TAT 6-24 HRS): SARS Coronavirus 2: NEGATIVE

## 2019-11-16 ENCOUNTER — Encounter (HOSPITAL_COMMUNITY): Payer: Self-pay | Admitting: Gastroenterology

## 2019-11-16 NOTE — Progress Notes (Signed)
Pre call made, pt aware to remain quarantined or wear mask until after procedure completed, informed to be at hospital at 0630, and will need a ride home from hospital post procedure

## 2019-11-16 NOTE — Anesthesia Preprocedure Evaluation (Addendum)
Anesthesia Evaluation  Patient identified by MRN, date of birth, ID band Patient awake    Reviewed: Allergy & Precautions, NPO status , Patient's Chart, lab work & pertinent test results, reviewed documented beta blocker date and time   History of Anesthesia Complications (+) PONV, Family history of anesthesia reaction and history of anesthetic complications  Airway Mallampati: II  TM Distance: >3 FB Neck ROM: Full    Dental no notable dental hx. (+) Teeth Intact   Pulmonary neg pulmonary ROS,    Pulmonary exam normal breath sounds clear to auscultation       Cardiovascular negative cardio ROS Normal cardiovascular exam Rhythm:Regular Rate:Normal  Hx/o Bicuspid Aortic Valve with Aortic arch aneurysm S/P AVR with conduit   Neuro/Psych negative neurological ROS  negative psych ROS   GI/Hepatic Neg liver ROS, Family Hx/o GB Ca Screening colonoscopy   Endo/Other  Hypothyroidism   Renal/GU negative Renal ROS  negative genitourinary   Musculoskeletal negative musculoskeletal ROS (+)   Abdominal   Peds  Hematology negative hematology ROS (+)   Anesthesia Other Findings   Reproductive/Obstetrics                            Anesthesia Physical Anesthesia Plan  ASA: III  Anesthesia Plan: MAC   Post-op Pain Management:    Induction:   PONV Risk Score and Plan: 3 and Ondansetron, Propofol infusion and Treatment may vary due to age or medical condition  Airway Management Planned: Natural Airway, Simple Face Mask and Nasal Cannula  Additional Equipment:   Intra-op Plan:   Post-operative Plan:   Informed Consent: I have reviewed the patients History and Physical, chart, labs and discussed the procedure including the risks, benefits and alternatives for the proposed anesthesia with the patient or authorized representative who has indicated his/her understanding and acceptance.      Dental advisory given  Plan Discussed with: CRNA  Anesthesia Plan Comments:        Anesthesia Quick Evaluation

## 2019-11-17 ENCOUNTER — Ambulatory Visit (HOSPITAL_COMMUNITY): Payer: 59 | Admitting: Anesthesiology

## 2019-11-17 ENCOUNTER — Ambulatory Visit (HOSPITAL_COMMUNITY)
Admission: RE | Admit: 2019-11-17 | Discharge: 2019-11-17 | Disposition: A | Discharge status: Home / Self Care | Payer: 59 | Attending: Gastroenterology | Admitting: Gastroenterology

## 2019-11-17 ENCOUNTER — Encounter (HOSPITAL_COMMUNITY): Payer: Self-pay | Admitting: Gastroenterology

## 2019-11-17 ENCOUNTER — Other Ambulatory Visit: Payer: Self-pay

## 2019-11-17 ENCOUNTER — Encounter (HOSPITAL_COMMUNITY)
Admission: RE | Disposition: A | Discharge status: Home / Self Care | Payer: Self-pay | Source: Home / Self Care | Attending: Gastroenterology

## 2019-11-17 DIAGNOSIS — Z79899 Other long term (current) drug therapy: Secondary | ICD-10-CM | POA: Insufficient documentation

## 2019-11-17 DIAGNOSIS — Z1211 Encounter for screening for malignant neoplasm of colon: Secondary | ICD-10-CM | POA: Diagnosis not present

## 2019-11-17 DIAGNOSIS — Z953 Presence of xenogenic heart valve: Secondary | ICD-10-CM | POA: Insufficient documentation

## 2019-11-17 DIAGNOSIS — K648 Other hemorrhoids: Secondary | ICD-10-CM | POA: Diagnosis not present

## 2019-11-17 DIAGNOSIS — E039 Hypothyroidism, unspecified: Secondary | ICD-10-CM | POA: Insufficient documentation

## 2019-11-17 DIAGNOSIS — Z7982 Long term (current) use of aspirin: Secondary | ICD-10-CM | POA: Diagnosis not present

## 2019-11-17 DIAGNOSIS — K644 Residual hemorrhoidal skin tags: Secondary | ICD-10-CM | POA: Diagnosis not present

## 2019-11-17 DIAGNOSIS — D125 Benign neoplasm of sigmoid colon: Secondary | ICD-10-CM | POA: Diagnosis not present

## 2019-11-17 HISTORY — PX: COLONOSCOPY WITH PROPOFOL: SHX5780

## 2019-11-17 HISTORY — PX: BIOPSY: SHX5522

## 2019-11-17 SURGERY — COLONOSCOPY WITH PROPOFOL
Anesthesia: Monitor Anesthesia Care

## 2019-11-17 MED ORDER — ONDANSETRON HCL 4 MG/2ML IJ SOLN
INTRAMUSCULAR | Status: DC | PRN
  Administered 2019-11-17: 4 mg via INTRAVENOUS

## 2019-11-17 MED ORDER — SODIUM CHLORIDE 0.9 % IV SOLN: INTRAVENOUS | Status: DC

## 2019-11-17 MED ORDER — LIDOCAINE 2% (20 MG/ML) 5 ML SYRINGE
INTRAMUSCULAR | Status: DC | PRN
  Administered 2019-11-17: 40 mg via INTRAVENOUS

## 2019-11-17 MED ORDER — PROPOFOL 10 MG/ML IV BOLUS
INTRAVENOUS | Status: DC | PRN
  Administered 2019-11-17: 20 mg via INTRAVENOUS
  Administered 2019-11-17: 50 mg via INTRAVENOUS
  Administered 2019-11-17: 10 mg via INTRAVENOUS
  Administered 2019-11-17: 50 mg via INTRAVENOUS
  Administered 2019-11-17 (×3): 20 mg via INTRAVENOUS
  Administered 2019-11-17: 50 mg via INTRAVENOUS
  Administered 2019-11-17 (×5): 20 mg via INTRAVENOUS
  Administered 2019-11-17: 50 mg via INTRAVENOUS

## 2019-11-17 MED ORDER — LACTATED RINGERS IV SOLN
INTRAVENOUS | Status: AC | PRN
  Administered 2019-11-17: 1000 mL via INTRAVENOUS

## 2019-11-17 SURGICAL SUPPLY — 22 items

## 2019-11-17 NOTE — Anesthesia Procedure Notes (Signed)
Procedure Name: MAC Date/Time: 11/17/2019 7:28 AM Performed by: Cynda Familia, CRNA Pre-anesthesia Checklist: Patient identified, Emergency Drugs available, Suction available, Patient being monitored and Timeout performed Patient Re-evaluated:Patient Re-evaluated prior to induction Oxygen Delivery Method: Simple face mask Placement Confirmation: positive ETCO2 and breath sounds checked- equal and bilateral Dental Injury: Teeth and Oropharynx as per pre-operative assessment

## 2019-11-17 NOTE — Discharge Instructions (Signed)

## 2019-11-17 NOTE — H&P (Addendum)
History & physical: Referring Physician: Dian Queen, MD [name] Short is an 49 y.o. female.  HPI: [name] Short is a 49 year old white female who presents to [facility] for screening colonoscopy. See office notes for details.  Past Medical History:  Diagnosis Date  . Breast feeding status of mother   . Complication of anesthesia   . Congenital heart defect    bicuspid aortic valve, aortic insufficeincy, ascending aortic aneurysm => s/p AVR (bioprosthetic) and ascending aortic aneurysm repair in 2008  . Family history of adverse reaction to anesthesia    Dad who also had CAD (valve disease) "coded" with anesthesia   . Heart murmur   . History of uterine fibroid   . Hx of cardiac cath    [facility] (2008, pre-surgery) with no coronary artery disease, EF 60% on LV-gram.  . Hx of cardiovascular stress test    ETT-Echo (7/15):  normal  . Hx of echocardiogram    Echo (7/15):  Mod LVH, EF 55-60%, no RWMA, + diast dysfunction, AVR ok (mean 16 mmHg), asc aorta 4 cm, trivial MR  . Hypothyroidism   . Multiple thyroid nodules    H/o goiter with thyroid surgery in 8/13  . PONV (postoperative nausea and vomiting)    Past Surgical History:  Procedure Laterality Date  . AORTIC VALVE REPLACEMENT     with replacement of ascending aorta  . CARDIAC CATHETERIZATION    . CARDIAC VALVE REPLACEMENT    . CESAREAN SECTION  2011  . CHOLECYSTECTOMY N/A 12/20/2015   Procedure: LAPAROSCOPIC CHOLECYSTECTOMY WITH INTRAOPERATIVE CHOLANGIOGRAM;  Surgeon: [name], MD;  Location: [place];  Service: General;  Laterality: N/A;  . MYOMECTOMY    . open heart surgery    . THYROID LOBECTOMY  12/31/2011   Procedure: THYROID LOBECTOMY;  Surgeon: Izora Gala, MD;  Location: [place];  Service: ENT;  Laterality: Right;  Substernal Goiter Removal  . THYROID LOBECTOMY  2003   Family History  Problem Relation Age of Onset  . Cancer Mother 63       Gall bladder; found at time gallstone found; smoker  .  Hypertension Mother   . Gallstones Mother   . Other Mother        hx of hysterectomy for unspecified reason  . Prostate cancer Father        dx early 35s; not very aggressive; s/p seed implants  . Skin cancer Father        unspecified type; +sun exposure  . Heart Problems Father        same as pt's; s/p surgery  . Goiter Sister   . Hypothyroidism Sister   . Thyroid disease Sister   . Cancer Maternal Grandmother        Gallbladder dx. 58-59; smoker  . Heart disease Maternal Grandfather   . Heart Problems Maternal Grandfather        heart disease  . Heart Problems Paternal Uncle        heart disease, d. 12s; +EtOH abuse  . Other Sister        hx of 1 benign breast biopys  . Ovarian cancer Other        maternal great aunt (MGM's sister); lim info   Social History:  reports that she has never smoked. She has never used smokeless tobacco. She reports current alcohol use of about 5.0 standard drinks of alcohol per week. She reports that she does not use drugs.  Allergies:  Allergies  Allergen Reactions  .  Amoxicillin Itching  . Mupirocin Itching, Swelling and Other (See Comments)    Numbness of upper lip    Medications: I have reviewed the patient's current medications.  No results found for this or any previous visit (from the past 48 hour(s)).  No results found.  ROS:  As stated above in the HPI otherwise negative.  Blood pressure 120/90, pulse (!) 103, temperature 98.5 F (36.9 C), temperature source Oral, resp. rate 17, height 5\' 7"  (1.702 m), weight 83.9 kg, last menstrual period 11/17/2019, SpO2 100 %.  PE: Gen: NAD, Alert and Oriented HEENT:  [place]/AT, EOMI Neck: Supple, no LAD Lungs: CTA Bilaterally, midline scar present from previous surgery CV: RRR without M/G/R ABM: Soft, NTND, +BS Ext: No C/C/E  Assessment/Plan: Colorectal cancer screening-proceed with a colonoscopy at this time.  Juanita Craver 11/17/2019, 7:08 AM

## 2019-11-17 NOTE — Op Note (Signed)
[facility] Patient Name: [name] Short Procedure Date: 11/17/2019 MRN: [id number] Attending MD: Juanita Craver , MD Date of Birth: [birth year] CSN: [id number] Age: 49 Admit Type: Outpatient Procedure:                Colonoscopy with cold biopsies. Indications:              CRC screening for colorectal malignant neoplasm. Providers:                Juanita Craver, MD, Cleda Daub, RN, Theodora Blow,                            Technician, Glenis Smoker, CRNA Referring MD:             Dian Queen MD Medicines:                Monitored Anesthesia Care Complications:            No immediate complications. Estimated Blood Loss:     Estimated blood loss was minimal. Procedure:                Pre-Anesthesia Assessment: - Prior to the                            procedure, a history and physical was performed,                            and patient medications and allergies were                            reviewed. The patient's tolerance of previous                            anesthesia was also reviewed. The risks and                            benefits of the procedure and the sedation options                            and risks were discussed with the patient. All                            questions were answered, and informed consent was                            obtained. Prior Anticoagulants: The patient has                            taken no previous anticoagulant or antiplatelet                            agents except for aspirin. ASA Grade Assessment: II                            - A patient with mild systemic disease. After  reviewing the risks and benefits, the patient was                            deemed in satisfactory condition to undergo the                            procedure. After obtaining informed consent, the                            colonoscope was passed under direct vision.                            Throughout the  procedure, the patient's blood                            pressure, pulse, and oxygen saturations were                            monitored continuously. The CF-HQ190L ([id number])                            Olympus colonoscope was introduced through the anus                            and advanced to the the cecum, identified by                            appendiceal orifice and ileocecal valve. The                            colonoscopy was performed without difficulty. The                            patient tolerated the procedure well. The quality                            of the bowel preparation was good. The ileocecal                            valve, the appendiceal orifice and the rectum were                            photographed. The bowel preparation used was                            Miralax via split dose instruction. Scope In: 7:34:07 AM Scope Out: 7:52:02 AM Scope Withdrawal Time: 0 hours 5 minutes 58 seconds  Total Procedure Duration: 0 hours 17 minutes 55 seconds  Findings:      External hemorrhoids were noted on perianal exam.      Two small sessile polyps were found in the distal sigmoid colon-these       were removed by cold biopsies.      Small internal hemorrhoid was noted on retroflexion.      The exam was otherwise  without abnormality. Impression:               - External hemorrhoids found on perianal exam.                           - Two small sessile polyps in the distal sigmoid                            colon-removed by cold biopsies.                           - Small internal hemorrhoid. Moderate Sedation:      MAC used. Recommendation:           - High fiber diet with augmented water consumption                            daily.                           - Continue present medications.                           - Await pathology results.                           - Repeat colonoscopy in 7-10 years for surveillance.                           - Return to  GI office PRN.                           - If the patient has any abnormal GI symptoms in                            the interim, she has been advised to call the                            office ASAP for further recommendations. Procedure Code(s):        --- Professional ---                           [phone], Colonoscopy, flexible; with biopsy, single                            or multiple Diagnosis Code(s):        --- Professional ---                           Z12.11, Encounter for screening for malignant                            neoplasm of colon                           D12.5, Benign neoplasm of sigmoid colon  K64.8, Other hemorrhoids CPT copyright 2019 American Medical Association. All rights reserved. The codes documented in this report are preliminary and upon coder review may  be revised to meet current compliance requirements. Juanita Craver, MD Juanita Craver, MD 11/17/2019 8:05:30 AM This report has been signed electronically. Number of Addenda: 0

## 2019-11-17 NOTE — Anesthesia Postprocedure Evaluation (Signed)
Anesthesia Post Note  Patient: [name] Short  Procedure(s) Performed: COLONOSCOPY WITH PROPOFOL (N/A ) BIOPSY     Patient location during evaluation: PACU Anesthesia Type: MAC Level of consciousness: awake and alert and oriented Pain management: pain level controlled Vital Signs Assessment: post-procedure vital signs reviewed and stable Respiratory status: spontaneous breathing, nonlabored ventilation and respiratory function stable Cardiovascular status: stable and blood pressure returned to baseline Postop Assessment: no apparent nausea or vomiting Anesthetic complications: no   No complications documented.  Last Vitals:  Vitals:   11/17/19 0800 11/17/19 0810  BP: 113/78 104/74  Pulse: 70 66  Resp: 15 11  Temp: 36.9 C   SpO2: 100% 100%    Last Pain:  Vitals:   11/17/19 0810  TempSrc:   PainSc: 0-No pain                 Elimelech Houseman A.

## 2019-11-17 NOTE — Transfer of Care (Signed)
Immediate Anesthesia Transfer of Care Note  Patient: [name]  Procedure(s) Performed: COLONOSCOPY WITH PROPOFOL (N/A ) BIOPSY  Patient Location: PACU and Endoscopy Unit  Anesthesia Type:MAC  Level of Consciousness: awake and alert   Airway & Oxygen Therapy: Patient Spontanous Breathing and Patient connected to face mask oxygen  Post-op Assessment: Report given to RN and Post -op Vital signs reviewed and stable  Post vital signs: Reviewed and stable  Last Vitals:  Vitals Value Taken Time  BP 113/78 11/17/19 0757  Temp    Pulse 72 11/17/19 0758  Resp 19 11/17/19 0758  SpO2 100 % 11/17/19 0758  Vitals shown include unvalidated device data.  Last Pain:  Vitals:   11/17/19 0657  TempSrc: Oral  PainSc: 0-No pain         Complications: No complications documented.

## 2019-11-18 LAB — SURGICAL PATHOLOGY

## 2019-11-22 ENCOUNTER — Encounter (HOSPITAL_COMMUNITY): Payer: Self-pay | Admitting: Gastroenterology

## 2020-02-03 ENCOUNTER — Ambulatory Visit
Admission: RE | Admit: 2020-02-03 | Discharge: 2020-02-03 | Disposition: A | Discharge status: Home / Self Care | Payer: 59 | Source: Ambulatory Visit | Attending: Obstetrics and Gynecology | Admitting: Obstetrics and Gynecology

## 2020-02-03 ENCOUNTER — Other Ambulatory Visit: Payer: Self-pay

## 2020-02-03 DIAGNOSIS — R921 Mammographic calcification found on diagnostic imaging of breast: Secondary | ICD-10-CM

## 2020-04-19 DIAGNOSIS — E890 Postprocedural hypothyroidism: Secondary | ICD-10-CM | POA: Diagnosis not present

## 2020-06-28 DIAGNOSIS — D225 Melanocytic nevi of trunk: Secondary | ICD-10-CM | POA: Diagnosis not present

## 2020-06-28 DIAGNOSIS — D2261 Melanocytic nevi of right upper limb, including shoulder: Secondary | ICD-10-CM | POA: Diagnosis not present

## 2020-06-28 DIAGNOSIS — L821 Other seborrheic keratosis: Secondary | ICD-10-CM | POA: Diagnosis not present

## 2020-06-28 DIAGNOSIS — D2271 Melanocytic nevi of right lower limb, including hip: Secondary | ICD-10-CM | POA: Diagnosis not present

## 2020-06-28 DIAGNOSIS — L570 Actinic keratosis: Secondary | ICD-10-CM | POA: Diagnosis not present

## 2020-07-31 DIAGNOSIS — E890 Postprocedural hypothyroidism: Secondary | ICD-10-CM | POA: Diagnosis not present

## 2020-08-02 ENCOUNTER — Other Ambulatory Visit (HOSPITAL_COMMUNITY): Payer: Self-pay | Admitting: Endocrinology

## 2020-08-02 DIAGNOSIS — E890 Postprocedural hypothyroidism: Secondary | ICD-10-CM

## 2020-08-06 ENCOUNTER — Other Ambulatory Visit: Payer: Self-pay

## 2020-08-06 ENCOUNTER — Encounter (HOSPITAL_COMMUNITY): Admission: RE | Admit: 2020-08-06 | Payer: BC Managed Care – PPO | Source: Ambulatory Visit

## 2020-08-06 ENCOUNTER — Ambulatory Visit (HOSPITAL_COMMUNITY)
Admission: RE | Admit: 2020-08-06 | Discharge: 2020-08-06 | Disposition: A | Discharge status: Home / Self Care | Payer: BC Managed Care – PPO | Source: Ambulatory Visit | Attending: Endocrinology | Admitting: Endocrinology

## 2020-08-06 ENCOUNTER — Encounter (HOSPITAL_COMMUNITY): Payer: Self-pay

## 2020-08-06 ENCOUNTER — Encounter (HOSPITAL_COMMUNITY): Payer: BC Managed Care – PPO

## 2020-08-06 DIAGNOSIS — E890 Postprocedural hypothyroidism: Secondary | ICD-10-CM

## 2020-08-06 MED ORDER — SODIUM IODIDE I-123 7.4 MBQ CAPS
488.0000 | ORAL_CAPSULE | Freq: Once | ORAL | Status: AC
  Administered 2020-08-06: 488 via ORAL

## 2020-08-07 ENCOUNTER — Encounter (HOSPITAL_COMMUNITY): Payer: BC Managed Care – PPO

## 2020-08-07 ENCOUNTER — Ambulatory Visit (HOSPITAL_COMMUNITY)
Admission: RE | Admit: 2020-08-07 | Discharge: 2020-08-07 | Disposition: A | Discharge status: Home / Self Care | Payer: BC Managed Care – PPO | Source: Ambulatory Visit | Attending: Endocrinology | Admitting: Endocrinology

## 2020-08-07 DIAGNOSIS — E041 Nontoxic single thyroid nodule: Secondary | ICD-10-CM | POA: Diagnosis not present

## 2020-08-21 ENCOUNTER — Other Ambulatory Visit: Payer: Self-pay

## 2020-08-21 ENCOUNTER — Ambulatory Visit: Payer: BC Managed Care – PPO | Admitting: Cardiovascular Disease

## 2020-08-21 ENCOUNTER — Encounter: Payer: Self-pay | Admitting: Cardiovascular Disease

## 2020-08-21 VITALS — BP 122/80 | HR 76 | Ht 67.0 in | Wt 189.0 lb

## 2020-08-21 DIAGNOSIS — Z8774 Personal history of (corrected) congenital malformations of heart and circulatory system: Secondary | ICD-10-CM | POA: Diagnosis not present

## 2020-08-21 DIAGNOSIS — Z13220 Encounter for screening for lipoid disorders: Secondary | ICD-10-CM | POA: Diagnosis not present

## 2020-08-21 DIAGNOSIS — I7121 Aneurysm of the ascending aorta, without rupture: Secondary | ICD-10-CM

## 2020-08-21 DIAGNOSIS — R002 Palpitations: Secondary | ICD-10-CM

## 2020-08-21 DIAGNOSIS — I712 Thoracic aortic aneurysm, without rupture: Secondary | ICD-10-CM | POA: Diagnosis not present

## 2020-08-21 DIAGNOSIS — E042 Nontoxic multinodular goiter: Secondary | ICD-10-CM

## 2020-08-21 DIAGNOSIS — Z952 Presence of prosthetic heart valve: Secondary | ICD-10-CM | POA: Diagnosis not present

## 2020-08-21 HISTORY — DX: Palpitations: R00.2

## 2020-08-21 LAB — COMPREHENSIVE METABOLIC PANEL
ALT: 14 IU/L (ref 0–32)
AST: 21 IU/L (ref 0–40)
Albumin/Globulin Ratio: 2 (ref 1.2–2.2)
Albumin: 4.7 g/dL (ref 3.8–4.9)
Alkaline Phosphatase: 58 IU/L (ref 44–121)
BUN/Creatinine Ratio: 14 (ref 9–23)
BUN: 12 mg/dL (ref 6–24)
Bilirubin Total: 0.5 mg/dL (ref 0.0–1.2)
CO2: 22 mmol/L (ref 20–29)
Calcium: 9.3 mg/dL (ref 8.7–10.2)
Chloride: 103 mmol/L (ref 96–106)
Creatinine, Ser: 0.87 mg/dL (ref 0.57–1.00)
Globulin, Total: 2.3 g/dL (ref 1.5–4.5)
Glucose: 88 mg/dL (ref 65–99)
Potassium: 4.1 mmol/L (ref 3.5–5.2)
Sodium: 139 mmol/L (ref 134–144)
Total Protein: 7 g/dL (ref 6.0–8.5)
eGFR: 81 mL/min/{1.73_m2} (ref >59)

## 2020-08-21 LAB — LIPID PANEL
Chol/HDL Ratio: 4.8 ratio — ABNORMAL HIGH (ref 0.0–4.4)
Cholesterol, Total: 233 mg/dL — ABNORMAL HIGH (ref 100–199)
HDL: 49 mg/dL (ref >39)
LDL Chol Calc (NIH): 159 mg/dL — ABNORMAL HIGH (ref 0–99)
Triglycerides: 139 mg/dL (ref 0–149)
VLDL Cholesterol Cal: 25 mg/dL (ref 5–40)

## 2020-08-21 NOTE — Assessment & Plan Note (Deleted)
Check echo.  Clinically stable.

## 2020-08-21 NOTE — Patient Instructions (Addendum)
Medication Instructions:  Your physician recommends that you continue on your current medications as directed. Please refer to the Current Medication list given to you today.   *If you need a refill on your cardiac medications before your next appointment, please call your pharmacy*  Lab Work: LP/CMET TODAY   If you have labs (blood work) drawn today and your tests are completely normal, you will receive your results only by: Marland Kitchen MyChart Message (if you have MyChart) OR . A paper copy in the mail If you have any lab test that is abnormal or we need to change your treatment, we will call you to review the results.  Testing/Procedures: Your physician has requested that you have an echocardiogram. Echocardiography is a painless test that uses sound waves to create images of your heart. It provides your doctor with information about the size and shape of your heart and how well your heart's chambers and valves are working. This procedure takes approximately one hour. There are no restrictions for this procedure. [facility] [address]   WILL GET MRA OF CHEST IN 1 YEAR TO FOLLOW UP  Follow-Up: At [facility], you and your health needs are our priority.  As part of our continuing mission to provide you with exceptional heart care, we have created designated Provider Care Teams.  These Care Teams include your primary Cardiologist (physician) and Advanced Practice Providers (APPs -  Physician Assistants and Nurse Practitioners) who all work together to provide you with the care you need, when you need it.  We recommend signing up for the patient portal called "MyChart".  Sign up information is provided on this After Visit Summary.  MyChart is used to connect with patients for Virtual Visits (Telemedicine).  Patients are able to view lab/test results, encounter notes, upcoming appointments, etc.  Non-urgent messages can be sent to your provider as well.   To learn more about what  you can do with MyChart, go to [email or web address].    Your next appointment:   12 month(s)  The format for your next appointment:   In Person  Provider:   DR [place]   Other Instructions  Consider getting a Kardia mobile device to track your palpitations.

## 2020-08-21 NOTE — Progress Notes (Signed)
Cardiology Office Note   Date:  08/21/2020   ID:  [name] Short, DOB 04-22-1971, MRN [id number]  PCP:  Patient, No Pcp Per (Inactive)  Cardiologist:  [name] Latch, MD  Electrophysiologist:  None   Evaluation Performed:  Follow-Up Visit  Chief Complaint:  Hypertension  History of Present Illness:     The patient does not have symptoms concerning for COVID-19 infection (fever, chills, cough, or new shortness of breath).   [name] Short is a 50 y.o. female with a bicuspid aortic valve and aortic aneysm s/p bioprosthetic AVR who is here for follow up. She was previously a patient of Dr. Aundra Short and then transferred to Dr. Saunders Short.  She will now establish in our office after Dr. Saunders Short transitioned to [place].  [name] had a bicuspid aortic valve and developed severe aortic regurgitation and an ascending aortic aneurysm.  In 2008 she underwent bioprosthetic aortic valve replacement and supracoronary ascending aorta replacement.  Her last MRA 03/2017 showed a mild ascending aneurysm 4.2 cm, unchanged from prior.  She had an echo 12/2015 that showed LVEF 60-65% and normal diastolic function.  Mean gradient across the bioprosthetic valve was 16 mmHg.    She had a MR-A on 09/06/19 that revealed a stable ascending aortic aneurysm at 4.4 cm.  She notes that her T4 was elevated recently, and she has a SHX of partial thyroidectomy. She is not having any severe symptoms, but she has felt her pulse in her ear and back which she thinks is unusal. She questions if she is having heart palpitations, but the episode last for about 5 minutes just before she lies down at night and stands up in the morning. She denies having shortness of breath, lightheadedness, or dizziness during this episodes. She has been taking MP Thyroid medication but was  recommended by her PCP to stop this medication for monitoring. She has loss weight intentionally by walking 20 miles per week and she also participates in  kickboxing once per week. During her walks she has mild chest pain occasionally while she is breathing but she is not concerned about this. She has no SOB when lying down. She denies any PND, orthopnea, or LE edema.    Past Medical History:  Diagnosis Date  . Breast feeding status of mother   . Complication of anesthesia   . Congenital heart defect    bicuspid aortic valve, aortic insufficeincy, ascending aortic aneurysm => s/p AVR (bioprosthetic) and ascending aortic aneurysm repair in 2008  . Family history of adverse reaction to anesthesia    Dad who also had CAD (valve disease) "coded" with anesthesia   . Heart murmur   . History of uterine fibroid   . Hx of cardiac cath    [facility] (2008, pre-surgery) with no coronary artery disease, EF 60% on LV-gram.  . Hx of cardiovascular stress test    ETT-Echo (7/15):  normal  . Hx of echocardiogram    Echo (7/15):  Mod LVH, EF 55-60%, no RWMA, + diast dysfunction, AVR ok (mean 16 mmHg), asc aorta 4 cm, trivial MR  . Hypothyroidism   . Multiple thyroid nodules    H/o goiter with thyroid surgery in 8/13  . Palpitations 08/21/2020  . PONV (postoperative nausea and vomiting)     Past Surgical History:  Procedure Laterality Date  . AORTIC VALVE REPLACEMENT     with replacement of ascending aorta  . BIOPSY  11/17/2019   Procedure: BIOPSY;  Surgeon: Juanita Craver, MD;  Location: [facility];  Service: Endoscopy;;  . CARDIAC CATHETERIZATION    . CARDIAC VALVE REPLACEMENT    . CESAREAN SECTION  2011  . CHOLECYSTECTOMY N/A 12/20/2015   Procedure: LAPAROSCOPIC CHOLECYSTECTOMY WITH INTRAOPERATIVE CHOLANGIOGRAM;  Surgeon: [name], MD;  Location: [place];  Service: General;  Laterality: N/A;  . COLONOSCOPY WITH PROPOFOL N/A 11/17/2019   Procedure: COLONOSCOPY WITH PROPOFOL;  Surgeon: Juanita Craver, MD;  [facility];  Service: Endoscopy;  Laterality: N/A;  . MYOMECTOMY    . open heart surgery    . THYROID LOBECTOMY  12/31/2011   Procedure:  THYROID LOBECTOMY;  Surgeon: Izora Gala, MD;  Location: [place];  Service: ENT;  Laterality: Right;  Substernal Goiter Removal  . THYROID LOBECTOMY  2003     Current Outpatient Medications  Medication Sig Dispense Refill  . ALPRAZolam (XANAX) 0.25 MG tablet Take 1 tablet prior to MRI 1 tablet 0  . aspirin EC 81 MG tablet Take 1 tablet (81 mg total) by mouth daily.    . Drospirenone (SLYND) 4 MG TABS     . VITAMIN D, CHOLECALCIFEROL, PO Take by mouth.     No current facility-administered medications for this visit.    Allergies:   Amoxicillin and Mupirocin    Social History:  The patient  reports that she has never smoked. She has never used smokeless tobacco. She reports current alcohol use of about 5.0 standard drinks of alcohol per week. She reports that she does not use drugs.   Family History:  The patient's family history includes Cancer in her maternal grandmother; Cancer (age of onset: 81) in her mother; Gallstones in her mother; Goiter in her sister; Heart Problems in her father, maternal grandfather, and paternal uncle; Heart disease in her maternal grandfather; Hypertension in her mother; Hypothyroidism in her sister; Other in her mother and sister; Ovarian cancer in an other family member; Prostate cancer in her father; Skin cancer in her father; Thyroid disease in her sister.    ROS:  Please see the history of present illness.   Otherwise, review of systems are positive for menorrhagia.   All other systems are reviewed and negative.    PHYSICAL EXAM: VS:  BP 122/80   Pulse 76   Ht 5\' 7"  (1.702 m)   Wt 189 lb (85.7 kg)   SpO2 98%   BMI 29.60 kg/m  , BMI Body mass index is 29.6 kg/m.  GENERAL:  Well appearing HEENT: Pupils equal round and reactive, fundi not visualized, oral mucosa unremarkable NECK:  No jugular venous distention, waveform within normal limits, carotid upstroke brisk and symmetric, no bruits, no thyromegaly LYMPHATICS:  No cervical adenopathy LUNGS:   Clear to auscultation bilaterally HEART:  RRR.  PMI not displaced or sustained,S1 and S2 within normal limits, no S3, no S4, no clicks, no rubs, 2/6 systolic murmur at the LUSB ABD:  Flat, positive bowel sounds normal in frequency in pitch, no bruits, no rebound, no guarding, no midline pulsatile mass, no hepatomegaly, no splenomegaly EXT:  2 plus pulses throughout, no edema, no cyanosis no clubbing SKIN:  No rashes no nodules NEURO:  Cranial nerves II through XII grossly intact, motor grossly intact throughout PSYCH:  Cognitively intact, oriented to person place and time  EKG:   4/22: Sinus rhythm, Eate 76 bpm, LAB 06/28/19: Sinus rhythm.  Rate 78 bpm  Echo 12/18/15: Study Conclusions  - Left ventricle: The cavity size was normal. Wall thickness was   increased in a pattern  of mild LVH. Systolic function was normal.   The estimated ejection fraction was in the range of 60% to 65%.   Wall motion was normal; there were no regional wall motion   abnormalities. Left ventricular diastolic function parameters   were normal. - Aortic valve: A bioprosthesis was present. There was no   regurgitation. Peak velocity (S): 276 cm/s. Mean gradient (S): 16   mm Hg. - Mitral valve: Transvalvular velocity was within the normal range.   There was no evidence for stenosis. There was trivial   regurgitation. - Left atrium: The atrium was mildly to moderately dilated. - Right ventricle: The cavity size was normal. Wall thickness was   normal. Systolic function was normal. - Atrial septum: No defect or patent foramen ovale was identified   by color flow Doppler. - Tricuspid valve: There was trivial regurgitation. - Pulmonary arteries: Systolic pressure was within the normal   range. PA peak pressure: 25 mm Hg (S).  Impressions:  - Bioprosthetic aortic valve with mildly elevated gradients but   unchanged from 11/2013.  MRA 03/25/17:  IMPRESSION: VASCULAR  1. Stable postsurgical changes of  aortic valve and aortic root replacement without evidence of complication. 2. Stable mild aneurysmal dilatation of the residual native ascending thoracic aorta with a maximal diameter of 4.2 cm.  Echo 05/2018: Study Conclusions   - Left ventricle: The cavity size was normal. There was mild focal  basal hypertrophy of the septum. Systolic function was normal.  The estimated ejection fraction was in the range of 60% to 65%.  Wall motion was normal; there were no regional wall motion  abnormalities. Left ventricular diastolic function parameters  were normal.  - Aortic valve: A prosthesis was present and functioning normally.  The prosthesis had a normal range of motion. The sewing ring  appeared normal, had no rocking motion, and showed no evidence of  dehiscence. Mean gradient (S): 17 mm Hg. Peak gradient (S): 36 mm  Hg. Valve area (VTI): 1.02 cm^2. Valve area (Vmax): 0.94 cm^2.  Valve area (Vmean): 1.04 cm^2.   Impressions:   - S/P AVR. Gradient similar to prior. Normal LV systolic and  diastolic function.   Recent Labs: 08/23/2019: ALT 12; BUN 9; Creatinine, Ser 0.79; Potassium 4.5; Sodium 142    Lipid Panel    Component Value Date/Time   CHOL 187 08/23/2019 0834   TRIG 146 08/23/2019 0834   HDL 38 (L) 08/23/2019 0834   CHOLHDL 4.9 (H) 08/23/2019 0834   CHOLHDL 4.3 03/12/2016 1548   VLDL 30 03/12/2016 1548   LDLCALC 123 (H) 08/23/2019 0834      Wt Readings from Last 3 Encounters:  08/21/20 189 lb (85.7 kg)  11/17/19 185 lb (83.9 kg)  08/01/19 194 lb (88 kg)     ASSESSMENT AND PLAN:  Ascending aortic aneurysm Stable on MRI 08/2019.  Will repeat 08/2021.  Blood pressure well-controlled.  Nontoxic multinodular goiter Followed by PCP for elevated free T4.  Palpitations May be related to elevated free T4.  Symptoms are sporadic.  She will consider getting a cardia mobile device.   Current medicines are reviewed at length with the patient  today.  The patient does not have concerns regarding medicines.  The following changes have been made:  no change  Labs/ tests ordered today include:   Orders Placed This Encounter  Procedures  . Lipid panel  . Comprehensive metabolic panel  . EKG 12-Lead  . ECHOCARDIOGRAM COMPLETE     Disposition: FU with  Macgregor Aeschliman C. Oval Linsey, MD, [facility] in 1 year   I,Alexis Bryant,acting as a scribe for [name] Latch, MD.,have documented all relevant documentation on the behalf of [name] Latch, MD,as directed by  [name] Latch, MD while in the presence of [name] Latch, MD.   Signed, [place]. Oval Linsey, MD, [facility]  08/21/2020 9:56 AM    [place]

## 2020-08-21 NOTE — Assessment & Plan Note (Signed)
Followed by PCP for elevated free T4.

## 2020-08-21 NOTE — Assessment & Plan Note (Signed)
Stable on MRI 08/2019.  Will repeat 08/2021.  Blood pressure well-controlled.

## 2020-08-21 NOTE — Assessment & Plan Note (Signed)
May be related to elevated free T4.  Symptoms are sporadic.  She will consider getting a cardia mobile device.

## 2020-08-22 ENCOUNTER — Other Ambulatory Visit: Payer: Self-pay | Admitting: *Deleted

## 2020-08-22 DIAGNOSIS — E785 Hyperlipidemia, unspecified: Secondary | ICD-10-CM

## 2020-09-26 ENCOUNTER — Other Ambulatory Visit: Payer: Self-pay

## 2020-09-26 ENCOUNTER — Ambulatory Visit (HOSPITAL_COMMUNITY): Payer: BC Managed Care – PPO | Attending: Cardiology

## 2020-09-26 DIAGNOSIS — I7121 Aneurysm of the ascending aorta, without rupture: Secondary | ICD-10-CM

## 2020-09-26 DIAGNOSIS — I712 Thoracic aortic aneurysm, without rupture: Secondary | ICD-10-CM | POA: Insufficient documentation

## 2020-09-26 DIAGNOSIS — Z952 Presence of prosthetic heart valve: Secondary | ICD-10-CM | POA: Diagnosis not present

## 2020-09-26 LAB — ECHOCARDIOGRAM COMPLETE
AR max vel: 1.41 cm2
AV Area VTI: 1.47 cm2
AV Area mean vel: 1.49 cm2
AV Mean grad: 12.8 mmHg
AV Peak grad: 24.1 mmHg
Ao pk vel: 2.45 m/s
Area-P 1/2: 3.06 cm2
S' Lateral: 3.2 cm

## 2020-12-11 DIAGNOSIS — L720 Epidermal cyst: Secondary | ICD-10-CM | POA: Diagnosis not present

## 2020-12-27 ENCOUNTER — Other Ambulatory Visit: Payer: Self-pay | Admitting: Obstetrics and Gynecology

## 2020-12-27 DIAGNOSIS — R921 Mammographic calcification found on diagnostic imaging of breast: Secondary | ICD-10-CM

## 2021-02-01 ENCOUNTER — Ambulatory Visit
Admission: RE | Admit: 2021-02-01 | Discharge: 2021-02-01 | Disposition: A | Discharge status: Home / Self Care | Payer: BC Managed Care – PPO | Source: Ambulatory Visit | Attending: Obstetrics and Gynecology | Admitting: Obstetrics and Gynecology

## 2021-02-01 ENCOUNTER — Other Ambulatory Visit: Payer: Self-pay

## 2021-02-01 DIAGNOSIS — R922 Inconclusive mammogram: Secondary | ICD-10-CM | POA: Diagnosis not present

## 2021-02-01 DIAGNOSIS — R921 Mammographic calcification found on diagnostic imaging of breast: Secondary | ICD-10-CM | POA: Diagnosis not present

## 2021-02-22 DIAGNOSIS — E785 Hyperlipidemia, unspecified: Secondary | ICD-10-CM | POA: Diagnosis not present

## 2021-02-22 LAB — COMPREHENSIVE METABOLIC PANEL
ALT: 13 IU/L (ref 0–32)
AST: 17 IU/L (ref 0–40)
Albumin/Globulin Ratio: 2.4 — ABNORMAL HIGH (ref 1.2–2.2)
Albumin: 4.7 g/dL (ref 3.8–4.9)
Alkaline Phosphatase: 64 IU/L (ref 44–121)
BUN/Creatinine Ratio: 15 (ref 9–23)
BUN: 12 mg/dL (ref 6–24)
Bilirubin Total: 0.5 mg/dL (ref 0.0–1.2)
CO2: 22 mmol/L (ref 20–29)
Calcium: 9.3 mg/dL (ref 8.7–10.2)
Chloride: 105 mmol/L (ref 96–106)
Creatinine, Ser: 0.81 mg/dL (ref 0.57–1.00)
Globulin, Total: 2 g/dL (ref 1.5–4.5)
Glucose: 95 mg/dL (ref 70–99)
Potassium: 4.5 mmol/L (ref 3.5–5.2)
Sodium: 140 mmol/L (ref 134–144)
Total Protein: 6.7 g/dL (ref 6.0–8.5)
eGFR: 88 mL/min/{1.73_m2} (ref >59)

## 2021-02-22 LAB — LIPID PANEL
Chol/HDL Ratio: 4.5 ratio — ABNORMAL HIGH (ref 0.0–4.4)
Cholesterol, Total: 203 mg/dL — ABNORMAL HIGH (ref 100–199)
HDL: 45 mg/dL (ref >39)
LDL Chol Calc (NIH): 137 mg/dL — ABNORMAL HIGH (ref 0–99)
Triglycerides: 118 mg/dL (ref 0–149)
VLDL Cholesterol Cal: 21 mg/dL (ref 5–40)

## 2021-03-05 DIAGNOSIS — Z6831 Body mass index (BMI) 31.0-31.9, adult: Secondary | ICD-10-CM | POA: Diagnosis not present

## 2021-03-05 DIAGNOSIS — Z01419 Encounter for gynecological examination (general) (routine) without abnormal findings: Secondary | ICD-10-CM | POA: Diagnosis not present

## 2021-07-01 DIAGNOSIS — D225 Melanocytic nevi of trunk: Secondary | ICD-10-CM | POA: Diagnosis not present

## 2021-07-01 DIAGNOSIS — D2262 Melanocytic nevi of left upper limb, including shoulder: Secondary | ICD-10-CM | POA: Diagnosis not present

## 2021-07-01 DIAGNOSIS — L814 Other melanin hyperpigmentation: Secondary | ICD-10-CM | POA: Diagnosis not present

## 2021-07-01 DIAGNOSIS — D1801 Hemangioma of skin and subcutaneous tissue: Secondary | ICD-10-CM | POA: Diagnosis not present

## 2021-07-17 ENCOUNTER — Encounter (HOSPITAL_BASED_OUTPATIENT_CLINIC_OR_DEPARTMENT_OTHER): Payer: Self-pay | Admitting: Cardiovascular Disease

## 2021-07-30 DIAGNOSIS — M7061 Trochanteric bursitis, right hip: Secondary | ICD-10-CM | POA: Diagnosis not present

## 2021-07-30 DIAGNOSIS — M7062 Trochanteric bursitis, left hip: Secondary | ICD-10-CM | POA: Diagnosis not present

## 2021-09-16 DIAGNOSIS — E0520 Thyrotoxicosis with toxic multinodular goiter without thyrotoxic crisis or storm: Secondary | ICD-10-CM | POA: Diagnosis not present

## 2021-09-16 DIAGNOSIS — E890 Postprocedural hypothyroidism: Secondary | ICD-10-CM | POA: Diagnosis not present

## 2021-09-17 DIAGNOSIS — E0520 Thyrotoxicosis with toxic multinodular goiter without thyrotoxic crisis or storm: Secondary | ICD-10-CM | POA: Diagnosis not present

## 2021-10-09 ENCOUNTER — Ambulatory Visit (HOSPITAL_BASED_OUTPATIENT_CLINIC_OR_DEPARTMENT_OTHER): Payer: BC Managed Care – PPO | Admitting: Cardiovascular Disease

## 2021-10-16 ENCOUNTER — Other Ambulatory Visit: Payer: Self-pay | Admitting: Cardiovascular Disease

## 2021-10-16 ENCOUNTER — Encounter (HOSPITAL_BASED_OUTPATIENT_CLINIC_OR_DEPARTMENT_OTHER): Payer: Self-pay | Admitting: Cardiovascular Disease

## 2021-10-16 ENCOUNTER — Ambulatory Visit (HOSPITAL_BASED_OUTPATIENT_CLINIC_OR_DEPARTMENT_OTHER): Payer: BC Managed Care – PPO | Admitting: Cardiovascular Disease

## 2021-10-16 VITALS — BP 118/76 | HR 85 | Ht 67.0 in | Wt 198.1 lb

## 2021-10-16 DIAGNOSIS — R002 Palpitations: Secondary | ICD-10-CM

## 2021-10-16 DIAGNOSIS — Z952 Presence of prosthetic heart valve: Secondary | ICD-10-CM | POA: Diagnosis not present

## 2021-10-16 DIAGNOSIS — I7121 Aneurysm of the ascending aorta, without rupture: Secondary | ICD-10-CM

## 2021-10-16 DIAGNOSIS — Z01812 Encounter for preprocedural laboratory examination: Secondary | ICD-10-CM | POA: Diagnosis not present

## 2021-10-16 NOTE — Progress Notes (Unsigned)
Cardiology Office Note   Date:  10/17/2021   ID:  [name] Short, DOB 1971-03-06, MRN [id number]  PCP:  Patient, No Pcp Per (Inactive)  Cardiologist:  Skeet Latch, MD  Electrophysiologist:  None   Evaluation Performed:  Follow-Up Visit  Chief Complaint:  Hypertension  History of Present Illness:     [name] Short is a 51 y.o. female with a bicuspid aortic valve and aortic aneysm s/p bioprosthetic AVR who is here for follow up. She was previously a patient of Dr. Aundra Dubin and then transferred to Dr. Saunders Revel.  She will now establish in our office after Dr. Saunders Revel transitioned to [place].  Ms. Tietje had a bicuspid aortic valve and developed severe aortic regurgitation and an ascending aortic aneurysm.  In 2008 she underwent bioprosthetic aortic valve replacement and supracoronary ascending aorta replacement.  Her last MRA 03/2017 showed a mild ascending aneurysm 4.2 cm, unchanged from prior.  She had an echo 12/2015 that showed LVEF 60-65% and normal diastolic function.  Mean gradient across the bioprosthetic valve was 16 mmHg.    She had a MR-A on 09/06/19 that revealed a stable ascending aortic aneurysm at 4.4 cm.  She notes that her T4 was elevated recently, and she has a SHX of partial thyroidectomy.  She had a repeat echo 09/2020 with LVEF 60 to 65% and grade 1 diastolic dysfunction.  Her bioprosthetic valve was functioning well.  Aortic root was 3.9 cm.  Lately she has been feeling well for the most part.  Last night she had a sharp pain in the middle of the night.  It was worse when she turned over.  It was also worse with taking deep breath.  She exercises regularly and has no exertional symptoms.  She does kickboxing and walks several days per week.  It is a little bit harder than it used to be, but she took a long time off during the pandemic and just started back in January.  She has not had any lower extremity edema, orthopnea, or PND.   Past Medical History:  Diagnosis Date    Breast feeding status of mother    Complication of anesthesia    Congenital heart defect    bicuspid aortic valve, aortic insufficeincy, ascending aortic aneurysm => s/p AVR (bioprosthetic) and ascending aortic aneurysm repair in 2008   Family history of adverse reaction to anesthesia    Dad who also had CAD (valve disease) "coded" with anesthesia    Heart murmur    History of uterine fibroid    Hx of cardiac cath    [facility] (2008, pre-surgery) with no coronary artery disease, EF 60% on LV-gram.   Hx of cardiovascular stress test    ETT-Echo (7/15):  normal   Hx of echocardiogram    Echo (7/15):  Mod LVH, EF 55-60%, no RWMA, + diast dysfunction, AVR ok (mean 16 mmHg), asc aorta 4 cm, trivial MR   Hypothyroidism    Multiple thyroid nodules    H/o goiter with thyroid surgery in 8/13   Palpitations 08/21/2020   PONV (postoperative nausea and vomiting)     Past Surgical History:  Procedure Laterality Date   AORTIC VALVE REPLACEMENT     with replacement of ascending aorta   BIOPSY  11/17/2019   Procedure: BIOPSY;  Surgeon: Juanita Craver, MD;  [facility];  Service: Endoscopy;;   CARDIAC CATHETERIZATION     CARDIAC VALVE REPLACEMENT     CESAREAN SECTION  2011   CHOLECYSTECTOMY  N/A 12/20/2015   Procedure: LAPAROSCOPIC CHOLECYSTECTOMY WITH INTRAOPERATIVE CHOLANGIOGRAM;  Surgeon: [name], MD;  Location: [place];  Service: General;  Laterality: N/A;   COLONOSCOPY WITH PROPOFOL N/A 11/17/2019   Procedure: COLONOSCOPY WITH PROPOFOL;  Surgeon: Juanita Craver, MD;  [facility];  Service: Endoscopy;  Laterality: N/A;   MYOMECTOMY     open heart surgery     THYROID LOBECTOMY  12/31/2011   Procedure: THYROID LOBECTOMY;  Surgeon: Izora Gala, MD;  Location: [place];  Service: ENT;  Laterality: Right;  Substernal Goiter Removal   THYROID LOBECTOMY  2003     Current Outpatient Medications  Medication Sig Dispense Refill   ALPRAZolam (XANAX) 0.25 MG tablet Take 1 tablet prior to  MRI 1 tablet 0   aspirin EC 81 MG tablet Take 1 tablet (81 mg total) by mouth daily.     Drospirenone (SLYND) 4 MG TABS      meloxicam (MOBIC) 15 MG tablet Take 15 mg by mouth daily.     VITAMIN D, CHOLECALCIFEROL, PO Take by mouth.     No current facility-administered medications for this visit.    Allergies:   Amoxicillin and Mupirocin    Social History:  The patient  reports that she has never smoked. She has never used smokeless tobacco. She reports current alcohol use of about 5.0 standard drinks per week. She reports that she does not use drugs.   Family History:  The patient's family history includes Cancer in her maternal grandmother; Cancer (age of onset: 43) in her mother; Gallstones in her mother; Goiter in her sister; Heart Problems in her father, maternal grandfather, and paternal uncle; Heart disease in her maternal grandfather; Hypertension in her mother; Hypothyroidism in her sister; Other in her mother and sister; Ovarian cancer in an other family member; Prostate cancer in her father; Skin cancer in her father; Thyroid disease in her sister.    ROS:  Please see the history of present illness.   Otherwise, review of systems are positive for menorrhagia.   All other systems are reviewed and negative.    PHYSICAL EXAM: VS:  BP 118/76 (BP Location: Right Arm, Patient Position: Sitting, Cuff Size: Normal)   Pulse 85   Ht '5\' 7"'$  (1.702 m)   Wt 198 lb 1.6 oz (89.9 kg)   BMI 31.03 kg/m  , BMI Body mass index is 31.03 kg/m.  GENERAL:  Well appearing HEENT: Pupils equal round and reactive, fundi not visualized, oral mucosa unremarkable NECK:  No jugular venous distention, waveform within normal limits, carotid upstroke brisk and symmetric, no bruits, no thyromegaly LYMPHATICS:  No cervical adenopathy LUNGS:  Clear to auscultation bilaterally HEART:  RRR.  PMI not displaced or sustained,S1 and S2 within normal limits, no S3, no S4, no clicks, no rubs, 2/6 systolic murmur at  the LUSB ABD:  Flat, positive bowel sounds normal in frequency in pitch, no bruits, no rebound, no guarding, no midline pulsatile mass, no hepatomegaly, no splenomegaly EXT:  2 plus pulses throughout, no edema, no cyanosis no clubbing SKIN:  No rashes no nodules NEURO:  Cranial nerves II through XII grossly intact, motor grossly intact throughout PSYCH:  Cognitively intact, oriented to person place and time  EKG:   10/16/21: Sinus rhythm.  Rate 85 bpm.  LAFB 4/22: Sinus rhythm, Eate 76 bpm, LAFB 06/28/19: Sinus rhythm.  Rate 78 bpm  Echo 12/18/15: Study Conclusions   - Left ventricle: The cavity size was normal. Wall thickness was   increased  in a pattern of mild LVH. Systolic function was normal.   The estimated ejection fraction was in the range of 60% to 65%.   Wall motion was normal; there were no regional wall motion   abnormalities. Left ventricular diastolic function parameters   were normal. - Aortic valve: A bioprosthesis was present. There was no   regurgitation. Peak velocity (S): 276 cm/s. Mean gradient (S): 16   mm Hg. - Mitral valve: Transvalvular velocity was within the normal range.   There was no evidence for stenosis. There was trivial   regurgitation. - Left atrium: The atrium was mildly to moderately dilated. - Right ventricle: The cavity size was normal. Wall thickness was   normal. Systolic function was normal. - Atrial septum: No defect or patent foramen ovale was identified   by color flow Doppler. - Tricuspid valve: There was trivial regurgitation. - Pulmonary arteries: Systolic pressure was within the normal   range. PA peak pressure: 25 mm Hg (S).   Impressions:   - Bioprosthetic aortic valve with mildly elevated gradients but   unchanged from 11/2013.  MRA 03/25/17:   IMPRESSION: VASCULAR   1. Stable postsurgical changes of aortic valve and aortic root replacement without evidence of complication. 2. Stable mild aneurysmal dilatation of the  residual native ascending thoracic aorta with a maximal diameter of 4.2 cm.  Echo 05/2018: Study Conclusions   - Left ventricle: The cavity size was normal. There was mild focal    basal hypertrophy of the septum. Systolic function was normal.    The estimated ejection fraction was in the range of 60% to 65%.    Wall motion was normal; there were no regional wall motion    abnormalities. Left ventricular diastolic function parameters    were normal.  - Aortic valve: A prosthesis was present and functioning normally.    The prosthesis had a normal range of motion. The sewing ring    appeared normal, had no rocking motion, and showed no evidence of    dehiscence. Mean gradient (S): 17 mm Hg. Peak gradient (S): 36 mm    Hg. Valve area (VTI): 1.02 cm^2. Valve area (Vmax): 0.94 cm^2.    Valve area (Vmean): 1.04 cm^2.   Impressions:   - S/P AVR. Gradient similar to prior. Normal LV systolic and    diastolic function.   Echo 09/2020: 1. Left ventricular ejection fraction, by estimation, is 60 to 65%. The  left ventricle has normal function. The left ventricle has no regional  wall motion abnormalities. Left ventricular diastolic parameters are  consistent with Grade I diastolic  dysfunction (impaired relaxation).   2. Right ventricular systolic function is normal. The right ventricular  size is normal.   3. The mitral valve is normal in structure. No evidence of mitral valve  regurgitation. No evidence of mitral stenosis.   4. The aortic valve has been repaired/replaced. Aortic valve  regurgitation is not visualized. No aortic stenosis is present. There is a  bioprosthetic valve present in the aortic position. Echo findings are  consistent with normal structure and function  of the aortic valve prosthesis. Aortic valve area, by VTI measures 1.47  cm. Aortic valve mean gradient measures 12.8 mmHg. Aortic valve Vmax  measures 2.45 m/s.   5. Aortic root repair. There is borderline  dilatation of the aortic root,  measuring 39 mm.   6. The inferior vena cava is normal in size with greater than 50%  respiratory variability, suggesting right atrial pressure of  3 mmHg.   Recent Labs: 02/22/2021: ALT 13; BUN 12; Creatinine, Ser 0.81; Potassium 4.5; Sodium 140    Lipid Panel    Component Value Date/Time   CHOL 203 (H) 02/22/2021 0837   TRIG 118 02/22/2021 0837   HDL 45 02/22/2021 0837   CHOLHDL 4.5 (H) 02/22/2021 0837   CHOLHDL 4.3 03/12/2016 1548   VLDL 30 03/12/2016 1548   LDLCALC 137 (H) 02/22/2021 0837      Wt Readings from Last 3 Encounters:  10/16/21 198 lb 1.6 oz (89.9 kg)  08/21/20 189 lb (85.7 kg)  11/17/19 185 lb (83.9 kg)     ASSESSMENT AND PLAN:  No problem-specific Assessment & Plan notes found for this encounter. Ascending aortic aneurysm Stable on MRI in 2021.  We will repeat an MRI/MRA to ensure it is stable.  She needs Xanax for the MRI scanner.   S/p AVR:  She has a bioprosthetic AVR.  She has not had any heart failure symptoms.  Valve is functioning normally with a mean gradient of 12.8 mmHg 09/2020.  Continue aspirin and endocarditis prophylaxis prior to dental procedures.  Palpitations Resolved.  Chest pain: Symptoms are very atypical.  She exercises regularly and has no exertional symptoms.  I suspect it is either musculoskeletal or was related to GERD.  If she has any symptoms with exertion we will consider further testing.  Current medicines are reviewed at length with the patient today.  The patient does not have concerns regarding medicines.  The following changes have been made:  no change  Labs/ tests ordered today include:   Orders Placed This Encounter  Procedures   MR Angiogram Chest W Wo Contrast   Basic metabolic panel   EKG [id number]     Disposition: FU with Hendel Gatliff C. Oval Linsey, MD, [facility] in 1 year     Signed, Lelia Jons C. Oval Linsey, MD, [facility]  10/17/2021 7:50 AM    [place]

## 2021-10-16 NOTE — Patient Instructions (Signed)
Medication Instructions:  Your physician recommends that you continue on your current medications as directed. Please refer to the Current Medication list given to you today.   *If you need a refill on your cardiac medications before your next appointment, please call your pharmacy*  Lab Work: BMET 1 WEEK PRIOR TO MRI/MRA  If you have labs (blood work) drawn today and your tests are completely normal, you will receive your results only by: [place] (if you have MyChart) OR A paper copy in the mail If you have any lab test that is abnormal or we need to change your treatment, we will call you to review the results.  Testing/Procedures: MRI/MRA OF CHEST   Follow-Up: At [facility], you and your health needs are our priority.  As part of our continuing mission to provide you with exceptional heart care, we have created designated Provider Care Teams.  These Care Teams include your primary Cardiologist (physician) and Advanced Practice Providers (APPs -  Physician Assistants and Nurse Practitioners) who all work together to provide you with the care you need, when you need it.  We recommend signing up for the patient portal called "MyChart".  Sign up information is provided on this After Visit Summary.  MyChart is used to connect with patients for Virtual Visits (Telemedicine).  Patients are able to view lab/test results, encounter notes, upcoming appointments, etc.  Non-urgent messages can be sent to your provider as well.   To learn more about what you can do with MyChart, go to [email or web address].    Your next appointment:   12 month(s)  The format for your next appointment:   In Person  Provider:   Skeet Latch, MD   Other Instructions [place] [phone]

## 2021-10-17 ENCOUNTER — Encounter (HOSPITAL_BASED_OUTPATIENT_CLINIC_OR_DEPARTMENT_OTHER): Payer: Self-pay | Admitting: Cardiovascular Disease

## 2021-10-17 LAB — BASIC METABOLIC PANEL
BUN/Creatinine Ratio: 14 (ref 9–23)
BUN: 13 mg/dL (ref 6–24)
CO2: 20 mmol/L (ref 20–29)
Calcium: 9.8 mg/dL (ref 8.7–10.2)
Chloride: 102 mmol/L (ref 96–106)
Creatinine, Ser: 0.9 mg/dL (ref 0.57–1.00)
Glucose: 100 mg/dL — ABNORMAL HIGH (ref 70–99)
Potassium: 4.4 mmol/L (ref 3.5–5.2)
Sodium: 137 mmol/L (ref 134–144)
eGFR: 77 mL/min/{1.73_m2} (ref >59)

## 2021-10-17 MED ORDER — ALPRAZOLAM 0.25 MG PO TABS: ORAL_TABLET | ORAL | 0 refills | Status: DC

## 2021-10-17 NOTE — Telephone Encounter (Signed)
Per Dr Oval Linsey ok x 1 prior to [place] [facility] to [facility]

## 2021-10-18 MED ORDER — ALPRAZOLAM 0.25 MG PO TABS: ORAL_TABLET | ORAL | 0 refills | Status: DC

## 2021-10-25 ENCOUNTER — Ambulatory Visit (HOSPITAL_BASED_OUTPATIENT_CLINIC_OR_DEPARTMENT_OTHER): Payer: BC Managed Care – PPO

## 2021-10-30 ENCOUNTER — Ambulatory Visit (HOSPITAL_COMMUNITY)
Admission: RE | Admit: 2021-10-30 | Discharge: 2021-10-30 | Disposition: A | Discharge status: Home / Self Care | Payer: BC Managed Care – PPO | Source: Ambulatory Visit | Attending: Cardiovascular Disease | Admitting: Cardiovascular Disease

## 2021-10-30 DIAGNOSIS — I7121 Aneurysm of the ascending aorta, without rupture: Secondary | ICD-10-CM | POA: Diagnosis not present

## 2021-10-30 DIAGNOSIS — Z952 Presence of prosthetic heart valve: Secondary | ICD-10-CM | POA: Diagnosis not present

## 2021-10-30 DIAGNOSIS — I712 Thoracic aortic aneurysm, without rupture, unspecified: Secondary | ICD-10-CM | POA: Diagnosis not present

## 2021-10-30 MED ORDER — GADOBUTROL 1 MMOL/ML IV SOLN
10.0000 mL | Freq: Once | INTRAVENOUS | Status: AC | PRN
  Administered 2021-10-30: 10 mL via INTRAVENOUS

## 2021-11-08 ENCOUNTER — Encounter (HOSPITAL_BASED_OUTPATIENT_CLINIC_OR_DEPARTMENT_OTHER): Payer: Self-pay | Admitting: Cardiovascular Disease

## 2021-11-15 ENCOUNTER — Ambulatory Visit: Payer: BC Managed Care – PPO | Admitting: Family Medicine

## 2021-11-15 ENCOUNTER — Encounter: Payer: Self-pay | Admitting: Family Medicine

## 2021-11-15 ENCOUNTER — Encounter: Payer: Self-pay | Admitting: *Deleted

## 2021-11-15 VITALS — BP 116/80 | HR 84 | Temp 98.2°F | Ht 67.0 in | Wt 199.1 lb

## 2021-11-15 DIAGNOSIS — Z0000 Encounter for general adult medical examination without abnormal findings: Secondary | ICD-10-CM | POA: Diagnosis not present

## 2021-11-15 NOTE — Patient Instructions (Signed)
Welcome to [facility] at [place]! It was a pleasure meeting you today. ° °As discussed, Please schedule a 12 month follow up visit today. ° °PLEASE NOTE: ° °If you had any LAB tests please let us know if you have not heard back within a few days. You may see your results on MyChart before we have a chance to review them but we will give you a call once they are reviewed by us. If we ordered any REFERRALS today, please let us know if you have not heard from their office within the next week.  °Let us know through MyChart if you are needing REFILLS, or have your pharmacy send us the request. You can also use MyChart to communicate with me or any office staff. ° °Please try these tips to maintain a healthy lifestyle: ° °Eat most of your calories during the day when you are active. Eliminate processed foods including packaged sweets (pies, cakes, cookies), reduce intake of potatoes, white bread, white pasta, and white rice. Look for whole grain options, oat flour or almond flour. ° °Each meal should contain half fruits/vegetables, one quarter protein, and one quarter carbs (no bigger than a computer mouse). ° °Cut down on sweet beverages. This includes juice, soda, and sweet tea. Also watch fruit intake, though this is a healthier sweet option, it still contains natural sugar! Limit to 3 servings daily. ° °Drink at least 1 glass of water with each meal and aim for at least 8 glasses per day ° °Exercise at least 150 minutes every week.   °

## 2021-11-15 NOTE — Progress Notes (Signed)
Phone [phone]   Subjective:   Patient is a 51 y.o. female presenting for annual physical.    Chief Complaint  Patient presents with   Establish Care    Need new pcp Not fasting Need general check-up    Cpx-and needs PCP-exercises 5-6x/wk.  Preprocedural anxiety so xanax for that. Heart-MRA alt w/echo.  Sees card Heavy menses-controlled on Slynd.  Attempt at ablation.  See problem oriented charting- ROS- ROS: Gen: no fever, chills  Skin: no rash, itching ENT: no ear pain, ear drainage, nasal congestion, rhinorrhea, sinus pressure, sore throat Eyes: no blurry vision, double vision Resp: no cough, wheeze,SOB CV: no CP, palpitations, LE edema,  GI: no heartburn, n/v/d/c, abd pain GU: no dysuria, urgency, frequency, hematuria MSK: bursitis in hips-meloxicam helps but has to help.  Saw ortho. Neuro: no dizziness, headache, weakness, vertigo Psych: no depression, anxiety, insomnia, SI   The following were reviewed and entered/updated in epic: Past Medical History:  Diagnosis Date   Breast feeding status of mother    Complication of anesthesia    Congenital heart defect    bicuspid aortic valve, aortic insufficeincy, ascending aortic aneurysm => s/p AVR (bioprosthetic) and ascending aortic aneurysm repair in 2008   Family history of adverse reaction to anesthesia    Dad who also had CAD (valve disease) "coded" with anesthesia    Heart murmur    History of uterine fibroid    Hx of cardiac cath    [facility] (2008, pre-surgery) with no coronary artery disease, EF 60% on LV-gram.   Hx of cardiovascular stress test    ETT-Echo (7/15):  normal   Hx of echocardiogram    Echo (7/15):  Mod LVH, EF 55-60%, no RWMA, + diast dysfunction, AVR ok (mean 16 mmHg), asc aorta 4 cm, trivial MR   Hypothyroidism    Multiple thyroid nodules    H/o goiter with thyroid surgery in 8/13   Palpitations 08/21/2020   PONV (postoperative nausea and vomiting)    Patient Active Problem List    Diagnosis Date Noted   Palpitations 08/21/2020   Acute otalgia, left 04/23/2018   Acute otitis externa of left ear 04/23/2018   History of bicuspid aortic valve 03/13/2017   Bilateral hearing loss due to cerumen impaction 03/03/2017   Family history of cancer of gallbladder 12/12/2015   S/P AVR 06/22/2012   Ascending aortic aneurysm ([place]) 06/22/2012   Aneurysm of aortic root ([place]) 01/06/2012   Nontoxic multinodular goiter 01/06/2012   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   Past Surgical History:  Procedure Laterality Date   AORTIC VALVE REPLACEMENT     with replacement of ascending aorta   BIOPSY  11/17/2019   Procedure: BIOPSY;  Surgeon: Juanita Craver, MD;  Location: [facility];  Service: Endoscopy;;   CARDIAC CATHETERIZATION     CARDIAC VALVE REPLACEMENT     CESAREAN SECTION  2011   CHOLECYSTECTOMY N/A 12/20/2015   Procedure: LAPAROSCOPIC CHOLECYSTECTOMY WITH INTRAOPERATIVE CHOLANGIOGRAM;  Surgeon: [name], MD;  Location: [place];  Service: General;  Laterality: N/A;   COLONOSCOPY WITH PROPOFOL N/A 11/17/2019   Procedure: COLONOSCOPY WITH PROPOFOL;  Surgeon: Juanita Craver, MD;  [facility];  Service: Endoscopy;  Laterality: N/A;   MYOMECTOMY     open heart surgery     THYROID LOBECTOMY  12/31/2011   Procedure: THYROID LOBECTOMY;  Surgeon: Izora Gala, MD;  Location: [place];  Service: ENT;  Laterality: Right;  Substernal Goiter Removal   THYROID LOBECTOMY  2003  Family History  Problem Relation Age of Onset   Cancer Mother 6       Gall bladder; found at time gallstone found; smoker   Hypertension Mother    Gallstones Mother    Other Mother        hx of hysterectomy for unspecified reason   Prostate cancer Father        dx early 81s; not very aggressive; s/p seed implants   Skin cancer Father        unspecified type; +sun exposure   Heart Problems Father        same as pt's; s/p surgery   Goiter Sister    Hypothyroidism Sister    Thyroid disease Sister    Cancer  Maternal Grandmother        Gallbladder dx. 58-59; smoker   Heart disease Maternal Grandfather    Heart Problems Maternal Grandfather        heart disease   Heart Problems Paternal Uncle        heart disease, d. 65s; +EtOH abuse   Other Sister        hx of 1 benign breast biopys   Ovarian cancer Other        maternal great aunt (MGM's sister); lim info    Medications- reviewed and updated Current Outpatient Medications  Medication Sig Dispense Refill   ALPRAZolam (XANAX) 0.25 MG tablet Take 1 tablet prior to MRI 1 tablet 0   aspirin EC 81 MG tablet Take 1 tablet (81 mg total) by mouth daily.     Betamethasone Valerate 0.12 % foam APPLY TO RASH ON BACK OF SCALP ONE TO TWO TIMES DAILY     clindamycin (CLEOCIN) 300 MG capsule TAKE 2 CAPSULES BY MOUTH PRIOR TO ALL DENTAL APPOINTMENTS     Drospirenone (SLYND) 4 MG TABS      meloxicam (MOBIC) 15 MG tablet Take 15 mg by mouth daily.     VITAMIN D, CHOLECALCIFEROL, PO Take by mouth.     No current facility-administered medications for this visit.    Allergies-reviewed and updated Allergies  Allergen Reactions   Amoxicillin Itching    Other reaction(s): Unknown   Mupirocin Itching, Swelling and Other (See Comments)    Numbness of upper lip     Social History   Social History Narrative   Retired Pharmacist, hospital   Objective  Objective:  BP 116/80   Pulse 84   Temp 98.2 F (36.8 C) (Temporal)   Ht '5\' 7"'$  (1.702 m)   Wt 199 lb 2 oz (90.3 kg)   SpO2 99%   BMI 31.19 kg/m  Physical: Gen: WDWN NAD HEENT: NCAT, conjunctiva not injected, sclera nonicteric TM WNL B, OP moist, no exudates  NECK:  supple, no thyromegaly, no nodes, no carotid bruits CARDIAC: RRR, S1S2+,+2/6 murmur. DP 2+B LUNGS: CTAB. No wheezes ABDOMEN:  BS+, soft, NTND, No HSM, no masses EXT:  no edema MSK: no gross abnormalities. Ms 5/5 all 4 NEURO: A&O x3.  CN II-XII intact.  PSYCH: normal mood. Good eye contact       Assessment and Plan   Health Maintenance  counseling: 1. Anticipatory guidance: Patient counseled regarding regular dental exams q6 months, eye exams,  avoiding smoking and second hand smoke, limiting alcohol to 1 beverage per day, no illicit drugs.   2. Risk factor reduction:  Advised patient of need for regular exercise and diet rich and fruits and vegetables to reduce risk of heart attack and stroke. Exercise- regularly.  Wt Readings from Last 3 Encounters:  11/15/21 199 lb 2 oz (90.3 kg)  10/16/21 198 lb 1.6 oz (89.9 kg)  08/21/20 189 lb (85.7 kg)   3. Immunizations/screenings/ancillary studies Immunization History  Administered Date(s) Administered   Influenza,inj,Quad PF,6+ Mos 04/13/2017   PFIZER(Purple Top)SARS-COV-2 Vaccination 07/31/2019, 08/20/2019, 03/01/2020, 10/29/2020   Pfizer Covid-19 Vaccine Bivalent Booster 55yr & up 02/18/2021   Tdap 04/08/2014   Health Maintenance Due  Topic Date Due   HIV Screening  Never done   Hepatitis C Screening  Never done   Zoster Vaccines- Shingrix (1 of 2) Never done    4. Cervical cancer screening- utd 5. Breast cancer screening-  mammogram utd 6. Colon cancer screening - utd 7. Skin cancer screening- advised regular sunscreen use. Denies worrisome, changing, or new skin lesions.  8. Birth control/STD check- on ocp 9. Osteoporosis screening- n/a 10. Smoking associated screening - non smoker  Problem List Items Addressed This Visit   None Visit Diagnoses     Wellness examination    -  Primary      Wellness-RHM UTD.  Antic guidance. Cont TLC.  Will get shingrix in future.   Recommended follow up: annualReturn in about 1 year (around 11/16/2022) for annual. No future appointments.  Lab/Order associations:+ fasting   ICD-10-CM   1. Wellness examination  Z00.00       No orders of the defined types were placed in this encounter.   AWellington Hampshire MD

## 2021-12-05 ENCOUNTER — Other Ambulatory Visit (HOSPITAL_BASED_OUTPATIENT_CLINIC_OR_DEPARTMENT_OTHER): Payer: Self-pay | Admitting: *Deleted

## 2021-12-05 DIAGNOSIS — I7121 Aneurysm of the ascending aorta, without rupture: Secondary | ICD-10-CM

## 2022-02-10 ENCOUNTER — Encounter: Payer: Self-pay | Admitting: *Deleted

## 2022-03-06 DIAGNOSIS — Z6831 Body mass index (BMI) 31.0-31.9, adult: Secondary | ICD-10-CM | POA: Diagnosis not present

## 2022-03-06 DIAGNOSIS — Z01419 Encounter for gynecological examination (general) (routine) without abnormal findings: Secondary | ICD-10-CM | POA: Diagnosis not present

## 2022-03-06 DIAGNOSIS — Z124 Encounter for screening for malignant neoplasm of cervix: Secondary | ICD-10-CM | POA: Diagnosis not present

## 2022-03-06 DIAGNOSIS — Z1231 Encounter for screening mammogram for malignant neoplasm of breast: Secondary | ICD-10-CM | POA: Diagnosis not present

## 2022-04-25 ENCOUNTER — Encounter: Payer: Self-pay | Admitting: Family Medicine

## 2022-04-28 ENCOUNTER — Encounter: Payer: Self-pay | Admitting: Family Medicine

## 2022-04-28 ENCOUNTER — Telehealth (INDEPENDENT_AMBULATORY_CARE_PROVIDER_SITE_OTHER): Payer: BC Managed Care – PPO | Admitting: Family Medicine

## 2022-04-28 DIAGNOSIS — U071 COVID-19: Secondary | ICD-10-CM | POA: Diagnosis not present

## 2022-04-28 NOTE — Progress Notes (Signed)
MyChart Video Visit    Virtual Visit via Video Note   This visit type was conducted due to national recommendations for restrictions regarding the COVID-19 Pandemic (e.g. social distancing) in an effort to limit this patient's exposure and mitigate transmission in our community. This patient is at least at moderate risk for complications without adequate follow up. This format is felt to be most appropriate for this patient at this time. Physical exam was limited by quality of the video and audio technology used for the visit. CMA was able to get the patient set up on a video visit.  Patient location: Home. Patient and provider in visit Provider location: Office  I discussed the limitations of evaluation and management by telemedicine and the availability of in person appointments. The patient expressed understanding and agreed to proceed.  Visit Date: 04/28/2022  Today's healthcare provider: Wellington Hampshire, MD     Subjective:    Patient ID: [name] Short, female    DOB: 1971-05-15, 51 y.o.   MRN: [id number]  Chief Complaint  Patient presents with   Covid Positive    Tested positive on 04/25/22 Sx started Friday, had a fever so took a Covid test   Nasal Congestion   Cough    Dry cough   Generalized Body Aches   Loss of taste    HPI Felt a little sick on 12/8 so checked and covid came up + right away.  On 12/9 started w/congesion, cough, achey, loss of taste.  No others ill.  Some fever as well.  Not doing much so not really sob.   Did have vomiting Monday before Thanksgiving.  Yesterday, took tylenol.  Feeling better today. Is self-isolating.   Past Medical History:  Diagnosis Date   Breast feeding status of mother    Complication of anesthesia    Congenital heart defect    bicuspid aortic valve, aortic insufficeincy, ascending aortic aneurysm => s/p AVR (bioprosthetic) and ascending aortic aneurysm repair in 2008   Family history of adverse reaction to anesthesia     Dad who also had CAD (valve disease) "coded" with anesthesia    Heart murmur    History of uterine fibroid    Hx of cardiac cath    [facility] (2008, pre-surgery) with no coronary artery disease, EF 60% on LV-gram.   Hx of cardiovascular stress test    ETT-Echo (7/15):  normal   Hx of echocardiogram    Echo (7/15):  Mod LVH, EF 55-60%, no RWMA, + diast dysfunction, AVR ok (mean 16 mmHg), asc aorta 4 cm, trivial MR   Hypothyroidism    Multiple thyroid nodules    H/o goiter with thyroid surgery in 8/13   Palpitations 08/21/2020   PONV (postoperative nausea and vomiting)     Past Surgical History:  Procedure Laterality Date   AORTIC VALVE REPLACEMENT     with replacement of ascending aorta   BIOPSY  11/17/2019   Procedure: BIOPSY;  Surgeon: Juanita Craver, MD;  [facility];  Service: Endoscopy;;   CARDIAC CATHETERIZATION     CARDIAC VALVE REPLACEMENT     CESAREAN SECTION  2011   CHOLECYSTECTOMY N/A 12/20/2015   Procedure: LAPAROSCOPIC CHOLECYSTECTOMY WITH INTRAOPERATIVE CHOLANGIOGRAM;  Surgeon: [name], MD;  Location: [place];  Service: General;  Laterality: N/A;   COLONOSCOPY WITH PROPOFOL N/A 11/17/2019   Procedure: COLONOSCOPY WITH PROPOFOL;  Surgeon: Juanita Craver, MD;  [facility];  Service: Endoscopy;  Laterality: N/A;   MYOMECTOMY  open heart surgery     THYROID LOBECTOMY  12/31/2011   Procedure: THYROID LOBECTOMY;  Surgeon: Izora Gala, MD;  Location: [place];  Service: ENT;  Laterality: Right;  Substernal Goiter Removal   THYROID LOBECTOMY  2003    Outpatient Medications Prior to Visit  Medication Sig Dispense Refill   ALPRAZolam (XANAX) 0.25 MG tablet Take 1 tablet prior to MRI 1 tablet 0   aspirin EC 81 MG tablet Take 1 tablet (81 mg total) by mouth daily.     Betamethasone Valerate 0.12 % foam APPLY TO RASH ON BACK OF SCALP ONE TO TWO TIMES DAILY     clindamycin (CLEOCIN) 300 MG capsule TAKE 2 CAPSULES BY MOUTH PRIOR TO ALL DENTAL APPOINTMENTS      Drospirenone (SLYND) 4 MG TABS      meloxicam (MOBIC) 15 MG tablet Take 15 mg by mouth daily.     VITAMIN D, CHOLECALCIFEROL, PO Take by mouth.     No facility-administered medications prior to visit.    Allergies  Allergen Reactions   Amoxicillin Itching    Other reaction(s): Unknown   Mupirocin Itching, Swelling and Other (See Comments)    Numbness of upper lip         Objective:     Physical Exam  Vitals and nursing note reviewed.  Constitutional:      General:  is not in acute distress.    Appearance: Normal appearance.  HENT:     Head: Normocephalic.  Some congestion Pulmonary:     Effort: No respiratory distress.  Skin:    General: Skin is dry.     Coloration: Skin is not pale.  Neurological:     Mental Status: Pt is alert and oriented to person, place, and time.  Psychiatric:        Mood and Affect: Mood normal.   There were no vitals taken for this visit.  Wt Readings from Last 3 Encounters:  11/15/21 199 lb 2 oz (90.3 kg)  10/16/21 198 lb 1.6 oz (89.9 kg)  08/21/20 189 lb (85.7 kg)       Assessment & Plan:   Problem List Items Addressed This Visit   None Visit Diagnoses     COVID-19    -  Primary     1  covid-doing some better today.  Discussed poss paxlovid but given some better and not really high risk, will hold on rx.  If worse next 2 days, will call and start.    No orders of the defined types were placed in this encounter.   I discussed the assessment and treatment plan with the patient. The patient was provided an opportunity to ask questions and all were answered. The patient agreed with the plan and demonstrated an understanding of the instructions.   The patient was advised to call back or seek an in-person evaluation if the symptoms worsen or if the condition fails to improve as anticipated.  I provided 15 minutes of face-to-face time during this encounter.   Wellington Hampshire, MD [place] [phone]  (phone) [phone] (fax)  [place]

## 2022-04-28 NOTE — Patient Instructions (Signed)
Happy Holidays  Let us know if getting worse and will do paxlovid.

## 2022-09-04 ENCOUNTER — Ambulatory Visit: Payer: BC Managed Care – PPO | Admitting: Family

## 2022-09-04 NOTE — Progress Notes (Deleted)
Patient ID: [name] Short, female    DOB: [birth year], 52 y.o.   MRN: [id number]  No chief complaint on file.   HPI:           Assessment & Plan:     Subjective:    Outpatient Medications Prior to Visit  Medication Sig Dispense Refill   ALPRAZolam (XANAX) 0.25 MG tablet Take 1 tablet prior to MRI 1 tablet 0   aspirin EC 81 MG tablet Take 1 tablet (81 mg total) by mouth daily.     Betamethasone Valerate 0.12 % foam APPLY TO RASH ON BACK OF SCALP ONE TO TWO TIMES DAILY     clindamycin (CLEOCIN) 300 MG capsule TAKE 2 CAPSULES BY MOUTH PRIOR TO ALL DENTAL APPOINTMENTS     Drospirenone (SLYND) 4 MG TABS      meloxicam (MOBIC) 15 MG tablet Take 15 mg by mouth daily.     VITAMIN D, CHOLECALCIFEROL, PO Take by mouth.     No facility-administered medications prior to visit.   Past Medical History:  Diagnosis Date   Breast feeding status of mother    Complication of anesthesia    Congenital heart defect    bicuspid aortic valve, aortic insufficeincy, ascending aortic aneurysm => s/p AVR (bioprosthetic) and ascending aortic aneurysm repair in 2008   Family history of adverse reaction to anesthesia    Dad who also had CAD (valve disease) "coded" with anesthesia    Heart murmur    History of uterine fibroid    Hx of cardiac cath    [facility] (2008, pre-surgery) with no coronary artery disease, EF 60% on LV-gram.   Hx of cardiovascular stress test    ETT-Echo (7/15):  normal   Hx of echocardiogram    Echo (7/15):  Mod LVH, EF 55-60%, no RWMA, + diast dysfunction, AVR ok (mean 16 mmHg), asc aorta 4 cm, trivial MR   Hypothyroidism    Multiple thyroid nodules    H/o goiter with thyroid surgery in 8/13   Palpitations 08/21/2020   PONV (postoperative nausea and vomiting)    Past Surgical History:  Procedure Laterality Date   AORTIC VALVE REPLACEMENT     with replacement of ascending aorta   BIOPSY  11/17/2019   Procedure: BIOPSY;  Surgeon: Charna Elizabeth, MD;  [facility];   Service: Endoscopy;;   CARDIAC CATHETERIZATION     CARDIAC VALVE REPLACEMENT     CESAREAN SECTION  2011   CHOLECYSTECTOMY N/A 12/20/2015   Procedure: LAPAROSCOPIC CHOLECYSTECTOMY WITH INTRAOPERATIVE CHOLANGIOGRAM;  Surgeon: Harriette Bouillon, MD;  [facility];  Service: General;  Laterality: N/A;   COLONOSCOPY WITH PROPOFOL N/A 11/17/2019   Procedure: COLONOSCOPY WITH PROPOFOL;  Surgeon: Charna Elizabeth, MD;  [facility];  Service: Endoscopy;  Laterality: N/A;   MYOMECTOMY     open heart surgery     THYROID LOBECTOMY  12/31/2011   Procedure: THYROID LOBECTOMY;  Surgeon: Serena Colonel, MD;  [facility]: [facility];  Service: ENT;  Laterality: Right;  Substernal Goiter Removal   THYROID LOBECTOMY  2003   Allergies  Allergen Reactions   Amoxicillin Itching    Other reaction(s): Unknown   Mupirocin Itching, Swelling and Other (See Comments)    Numbness of upper lip       Objective:    Physical Exam Vitals and nursing note reviewed.  Constitutional:      Appearance: Normal appearance.  Cardiovascular:     Rate and Rhythm: Normal rate and regular rhythm.  Pulmonary:  Effort: Pulmonary effort is normal.     Breath sounds: Normal breath sounds.  Musculoskeletal:        General: Normal range of motion.  Skin:    General: Skin is warm and dry.  Neurological:     Mental Status: She is alert.  Psychiatric:        Mood and Affect: Mood normal.        Behavior: Behavior normal.    There were no vitals taken for this visit. Wt Readings from Last 3 Encounters:  11/15/21 199 lb 2 oz (90.3 kg)  10/16/21 198 lb 1.6 oz (89.9 kg)  08/21/20 189 lb (85.7 kg)       Dulce Sellar, NP

## 2022-10-16 ENCOUNTER — Other Ambulatory Visit (HOSPITAL_BASED_OUTPATIENT_CLINIC_OR_DEPARTMENT_OTHER): Payer: Self-pay | Admitting: *Deleted

## 2022-10-16 ENCOUNTER — Encounter: Payer: Self-pay | Admitting: Physician Assistant

## 2022-10-16 ENCOUNTER — Ambulatory Visit: Payer: No Typology Code available for payment source | Admitting: Physician Assistant

## 2022-10-16 VITALS — BP 100/70 | HR 94 | Temp 97.5°F | Ht 67.0 in | Wt 193.5 lb

## 2022-10-16 DIAGNOSIS — R300 Dysuria: Secondary | ICD-10-CM | POA: Diagnosis not present

## 2022-10-16 DIAGNOSIS — K5900 Constipation, unspecified: Secondary | ICD-10-CM | POA: Diagnosis not present

## 2022-10-16 LAB — POCT URINALYSIS DIPSTICK
Bilirubin, UA: NEGATIVE
Blood, UA: NEGATIVE
Glucose, UA: NEGATIVE
Ketones, UA: POSITIVE
Nitrite, UA: NEGATIVE
Protein, UA: NEGATIVE
Spec Grav, UA: 1.015 (ref 1.010–1.025)
Urobilinogen, UA: 0.2 E.U./dL
pH, UA: 6 (ref 5.0–8.0)

## 2022-10-16 MED ORDER — CEPHALEXIN 500 MG PO CAPS: 500.0000 mg | ORAL_CAPSULE | Freq: Two times a day (BID) | ORAL | 0 refills | Status: DC

## 2022-10-16 NOTE — Progress Notes (Signed)
[name] Short is a 52 y.o. female here for a new problem.  History of Present Illness:   Chief Complaint  Patient presents with   Abdominal Pain    Pt c/o right side abdominal pain started yesterday, pt said this happened last month same symptom started and then turned into a UTI.    Urinary tract infection A month ago had similar symptom(s) -- developed side pain, burning with urination, fever Went to urgent care and was given cipro for ten days Had a urine culture done which is available to me through the [facility] reports She had E. coli and beta-hemolytic Streptococcus group B which was susceptible to Cipro Symptoms resolved with treatment  Yesterday, developed similar symptoms She has not had fever, chills but is trying to get ahead of her symptoms this time  Denies any significant changes with her lifestyle except she has noticed that she is having increasing constipation, she does not take anything for this    Past Medical History:  Diagnosis Date   Breast feeding status of mother    Complication of anesthesia    Congenital heart defect    bicuspid aortic valve, aortic insufficeincy, ascending aortic aneurysm => s/p AVR (bioprosthetic) and ascending aortic aneurysm repair in 2008   Family history of adverse reaction to anesthesia    Dad who also had CAD (valve disease) "coded" with anesthesia    Heart murmur    History of uterine fibroid    Hx of cardiac cath    [facility] (2008, pre-surgery) with no coronary artery disease, EF 60% on LV-gram.   Hx of cardiovascular stress test    ETT-Echo (7/15):  normal   Hx of echocardiogram    Echo (7/15):  Mod LVH, EF 55-60%, no RWMA, + diast dysfunction, AVR ok (mean 16 mmHg), asc aorta 4 cm, trivial MR   Hypothyroidism    Multiple thyroid nodules    H/o goiter with thyroid surgery in 8/13   Palpitations 08/21/2020   PONV (postoperative nausea and vomiting)      Social History   Tobacco Use   Smoking status: Never    Smokeless tobacco: Never   Tobacco comments:    only smoked very infrequently in college  Substance Use Topics   Alcohol use: Yes    Alcohol/week: 5.0 standard drinks of alcohol    Types: 5 Standard drinks or equivalent per week    Comment: 1 glass wine per day   Drug use: No    Past Surgical History:  Procedure Laterality Date   AORTIC VALVE REPLACEMENT     with replacement of ascending aorta   BIOPSY  11/17/2019   Procedure: BIOPSY;  Surgeon: Charna Elizabeth, MD;  Location: [facility];  Service: Endoscopy;;   CARDIAC CATHETERIZATION     CARDIAC VALVE REPLACEMENT     CESAREAN SECTION  2011   CHOLECYSTECTOMY N/A 12/20/2015   Procedure: LAPAROSCOPIC CHOLECYSTECTOMY WITH INTRAOPERATIVE CHOLANGIOGRAM;  Surgeon: Harriette Bouillon, MD;  Location: [facility] OR;  Service: General;  Laterality: N/A;   COLONOSCOPY WITH PROPOFOL N/A 11/17/2019   Procedure: COLONOSCOPY WITH PROPOFOL;  Surgeon: Charna Elizabeth, MD;  Location: [facility];  Service: Endoscopy;  Laterality: N/A;   MYOMECTOMY     open heart surgery     THYROID LOBECTOMY  12/31/2011   Procedure: THYROID LOBECTOMY;  Surgeon: Serena Colonel, MD;  Location: [facility] OR;  Service: ENT;  Laterality: Right;  Substernal Goiter Removal   THYROID LOBECTOMY  2003    Family History  Problem  Relation Age of Onset   Cancer Mother 25       Gall bladder; found at time gallstone found; smoker   Hypertension Mother    Gallstones Mother    Other Mother        hx of hysterectomy for unspecified reason   Prostate cancer Father        dx early 47s; not very aggressive; s/p seed implants   Skin cancer Father        unspecified type; +sun exposure   Heart Problems Father        same as pt's; s/p surgery   Goiter Sister    Hypothyroidism Sister    Thyroid disease Sister    Cancer Maternal Grandmother        Gallbladder dx. 58-59; smoker   Heart disease Maternal Grandfather    Heart Problems Maternal Grandfather        heart disease   Heart Problems Paternal Uncle         heart disease, d. 51s; +EtOH abuse   Other Sister        hx of 1 benign breast biopys   Ovarian cancer Other        maternal great aunt (MGM's sister); lim info    Allergies  Allergen Reactions   Amoxicillin Itching    Other reaction(s): Unknown   Mupirocin Itching, Swelling and Other (See Comments)    Numbness of upper lip     Current Medications:   Current Outpatient Medications:    aspirin EC 81 MG tablet, Take 1 tablet (81 mg total) by mouth daily., Disp: , Rfl:    clindamycin (CLEOCIN) 300 MG capsule, TAKE 2 CAPSULES BY MOUTH PRIOR TO ALL DENTAL APPOINTMENTS, Disp: , Rfl:    Drospirenone (SLYND) 4 MG TABS, , Disp: , Rfl:    VITAMIN D, CHOLECALCIFEROL, PO, Take by mouth., Disp: , Rfl:    meloxicam (MOBIC) 15 MG tablet, Take 15 mg by mouth daily. (Patient not taking: Reported on 10/16/2022), Disp: , Rfl:    Review of Systems:   Review of Systems  Gastrointestinal:  Positive for abdominal pain.   Negative unless otherwise specified per HPI.  Vitals:   Vitals:   10/16/22 1000  BP: 100/70  Pulse: 94  Temp: (!) 97.5 F (36.4 C)  TempSrc: Temporal  SpO2: 99%  Weight: 193 lb 8 oz (87.8 kg)  Height: 5\' 7"  (1.702 m)     Body mass index is 30.31 kg/m.  Physical Exam:   Physical Exam Vitals and nursing note reviewed.  Constitutional:      General: She is not in acute distress.    Appearance: She is well-developed. She is not ill-appearing or toxic-appearing.  Cardiovascular:     Rate and Rhythm: Normal rate and regular rhythm.     Pulses: Normal pulses.     Heart sounds: S1 normal and S2 normal. Murmur heard.  Pulmonary:     Effort: Pulmonary effort is normal.     Breath sounds: Normal breath sounds.  Abdominal:     Tenderness: There is no right CVA tenderness or left CVA tenderness.  Skin:    General: Skin is warm and dry.  Neurological:     Mental Status: She is alert.     GCS: GCS eye subscore is 4. GCS verbal subscore is 5. GCS motor subscore is  6.  Psychiatric:        Speech: Speech normal.        Behavior: Behavior normal.  Behavior is cooperative.    Results for orders placed or performed in visit on 10/16/22  POCT urinalysis dipstick  Result Value Ref Range   Color, UA yellow    Clarity, UA clear    Glucose, UA Negative Negative   Bilirubin, UA negative    Ketones, UA positive    Spec Grav, UA 1.015 1.010 - 1.025   Blood, UA negative    pH, UA 6.0 5.0 - 8.0   Protein, UA Negative Negative   Urobilinogen, UA 0.2 0.2 or 1.0 E.U./dL   Nitrite, UA negative    Leukocytes, UA Small (1+) (A) Negative   Appearance     Odor      Assessment and Plan:   Dysuria No red flags on my exam I do not feel that she has significant systemic symptoms at this time Given recent UTI and possible pyelo we will go ahead and empirically start treatment with cephalexin 500 mg twice daily for 7 days Urine culture pending She does have a possible penicillin allergy where she had itching on hands and feet from when she was much younger  --she is agreeable to trialing a cephalosporin and is aware that there could be cross-reactivity and to look out for any sort of allergic reaction If any symptoms or side effects occur with cephalexin, she was told to discontinue this and seek urgent treatment or emergency treatment if indicated  Constipation, unspecified constipation type Discussed trialing MiraLAX to help with regular bowel movements If symptoms persist recommend close follow-up with PCP  Jarold Motto, PA-C

## 2022-10-16 NOTE — Addendum Note (Signed)
Addended by: Regis Bill B on: 10/16/2022 05:59 PM   Modules accepted: Orders

## 2022-10-16 NOTE — Telephone Encounter (Signed)
Patient scheduled for MRA and asked if Dr Duke Salvia could call in something for her because she is claustrophobic   Chilton Si, MD  You1 hour ago (3:39 PM)    OK for her to have Xanax 0.5mg  30 min before.  OK to have an additional 0.5mg  as needed at the time of the scan.  TCR   Rx called to [facility]

## 2022-10-16 NOTE — Patient Instructions (Signed)
It was great to see you!  Please start keflex for your suspected urinary tract infection I think you will be able to tolerate this well, however, if any allergic reaction symptoms develop -- stop antibiotic(s) and call our office ASAP  To treat your constipation today: -Take 100 mg of docusate sodium (also known as Colace, however generic is fine!) by mouth twice daily to soften stools -Drink at least 64 oz of water daily -Add in 1 capful of polyethylene glycol (also known as Miralax, however generic is fine!) to beverage of choice daily  Goal is to have a formed, soft bowel movement regularly   -After a few days if no success, may increase to a total of 2 capfuls of polyethylene glycol/ Miralax daily  If still no results, please call the office   General instructions Make sure you: Pee until your bladder is empty. Do not hold pee for a long time. Empty your bladder after sex. Wipe from front to back after pooping if you are a female. Use each tissue one time when you wipe. Drink enough fluid to keep your pee pale yellow. Keep all follow-up visits as told by your doctor. This is important. Contact a doctor if: You do not get better after 1-2 days. Your symptoms go away and then come back. Get help right away if: You have very bad back pain. You have very bad pain in your lower belly. You have a fever. You are sick to your stomach (nauseous). You are throwing up.   Take care,  Jarold Motto PA-C

## 2022-10-17 LAB — URINE CULTURE
MICRO NUMBER:: 15020369
Result:: NO GROWTH
SPECIMEN QUALITY:: ADEQUATE

## 2022-10-18 ENCOUNTER — Encounter (HOSPITAL_COMMUNITY): Payer: Self-pay

## 2022-10-18 ENCOUNTER — Ambulatory Visit (HOSPITAL_COMMUNITY)
Admission: RE | Admit: 2022-10-18 | Discharge: 2022-10-18 | Disposition: A | Discharge status: Home / Self Care | Payer: No Typology Code available for payment source | Source: Ambulatory Visit | Attending: Physician Assistant | Admitting: Physician Assistant

## 2022-10-18 VITALS — BP 131/91 | HR 98 | Temp 98.9°F | Resp 16 | Ht 66.0 in | Wt 190.0 lb

## 2022-10-18 DIAGNOSIS — B029 Zoster without complications: Secondary | ICD-10-CM | POA: Diagnosis not present

## 2022-10-18 MED ORDER — GABAPENTIN 300 MG PO CAPS: 300.0000 mg | ORAL_CAPSULE | Freq: Every day | ORAL | 0 refills | Status: DC

## 2022-10-18 MED ORDER — VALACYCLOVIR HCL 1 G PO TABS: 1000.0000 mg | ORAL_TABLET | Freq: Three times a day (TID) | ORAL | 0 refills | Status: DC

## 2022-10-18 MED ORDER — PREDNISONE 10 MG (21) PO TBPK: ORAL_TABLET | ORAL | 0 refills | Status: DC

## 2022-10-18 NOTE — Discharge Instructions (Signed)
You have shingles.  Please start valacyclovir 1000 mg 3 times daily for 1 week.  This will help your body manage the virus.  Start prednisone taper.  Do not take NSAIDs with this medication including aspirin, ibuprofen/Advil, naproxen/Aleve.  You can use Tylenol/acetaminophen for additional symptom relief.  Take gabapentin at night.  This make you sleepy so do not drive or drink alcohol with taking it.  Keep the area clean to prevent infection.  You are contagious until the lesions have healed.  Avoid contact with anyone with a weakened immune system including pregnant visuals, anyone on chemotherapy.  If you have any worsening or changing symptoms please return for reevaluation.

## 2022-10-18 NOTE — ED Provider Notes (Addendum)
[facility]    CSN: [id number] Arrival date & time: 10/18/22  1145      History   Chief Complaint Chief Complaint  Patient presents with   Flank Pain    Entered by patient    HPI [name] Short is a 52 y.o. female.   Patient presents today with a several day history of right-sided flank pain.  She reports that the pain is rated 6 on a 0-10 pain scale, described as a burning/stinging sensation.  She went to see her primary care when symptoms began and they initially thought symptoms related to UTI as she has had UTI/pyelonephritis in the past with similar initial symptoms.  UA did have trace leuks and urine culture was sent that ultimately did not have any growth.  She was empirically treated with cephalexin but told to stop this once the culture result returned.  Over the past several days she has had persistent and worsening pain and developed a rash.  She has not had the shingles vaccine.  She has been taking ibuprofen without improvement of symptoms.  She denies any associated fever, chest pain, shortness of breath, nausea, vomiting.    Past Medical History:  Diagnosis Date   Breast feeding status of mother    Complication of anesthesia    Congenital heart defect    bicuspid aortic valve, aortic insufficeincy, ascending aortic aneurysm => s/p AVR (bioprosthetic) and ascending aortic aneurysm repair in 2008   Family history of adverse reaction to anesthesia    Dad who also had CAD (valve disease) "coded" with anesthesia    Heart murmur    History of uterine fibroid    Hx of cardiac cath    [facility] (2008, pre-surgery) with no coronary artery disease, EF 60% on LV-gram.   Hx of cardiovascular stress test    ETT-Echo (7/15):  normal   Hx of echocardiogram    Echo (7/15):  Mod LVH, EF 55-60%, no RWMA, + diast dysfunction, AVR ok (mean 16 mmHg), asc aorta 4 cm, trivial MR   Hypothyroidism    Multiple thyroid nodules    H/o goiter with thyroid surgery in 8/13    Palpitations 08/21/2020   PONV (postoperative nausea and vomiting)     Patient Active Problem List   Diagnosis Date Noted   Palpitations 08/21/2020   Acute otalgia, left 04/23/2018   Acute otitis externa of left ear 04/23/2018   History of bicuspid aortic valve 03/13/2017   Bilateral hearing loss due to cerumen impaction 03/03/2017   Family history of cancer of gallbladder 12/12/2015   S/P AVR 06/22/2012   Ascending aortic aneurysm (HCC) 06/22/2012   Aneurysm of aortic root (HCC) 01/06/2012   Nontoxic multinodular goiter 01/06/2012   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    Past Surgical History:  Procedure Laterality Date   AORTIC VALVE REPLACEMENT     with replacement of ascending aorta   BIOPSY  11/17/2019   Procedure: BIOPSY;  Surgeon: Charna Elizabeth, MD;  Location: [facility];  Service: Endoscopy;;   CARDIAC CATHETERIZATION     CARDIAC VALVE REPLACEMENT     CESAREAN SECTION  2011   CHOLECYSTECTOMY N/A 12/20/2015   Procedure: LAPAROSCOPIC CHOLECYSTECTOMY WITH INTRAOPERATIVE CHOLANGIOGRAM;  Surgeon: Harriette Bouillon, MD;  Location: [facility] OR;  Service: General;  Laterality: N/A;   COLONOSCOPY WITH PROPOFOL N/A 11/17/2019   Procedure: COLONOSCOPY WITH PROPOFOL;  Surgeon: Charna Elizabeth, MD;  Location: [facility];  Service: Endoscopy;  Laterality: N/A;   MYOMECTOMY  open heart surgery     THYROID LOBECTOMY  12/31/2011   Procedure: THYROID LOBECTOMY;  Surgeon: Serena Colonel, MD;  Location: [facility] OR;  Service: ENT;  Laterality: Right;  Substernal Goiter Removal   THYROID LOBECTOMY  2003    OB History   No obstetric history on file.      Home Medications    Prior to Admission medications   Medication Sig Start Date End Date Taking? Authorizing Provider  aspirin EC 81 MG tablet Take 1 tablet (81 mg total) by mouth daily. 03/12/16  Yes Laurey Morale, MD  Drospirenone (SLYND) 4 MG TABS  03/19/20  Yes [provider]  VITAMIN D, CHOLECALCIFEROL, PO Take by mouth.   Yes [provider]  clindamycin (CLEOCIN) 300 MG capsule TAKE 2 CAPSULES BY MOUTH PRIOR TO ALL DENTAL APPOINTMENTS    [provider]  gabapentin (NEURONTIN) 300 MG capsule Take 1 capsule (300 mg total) by mouth at bedtime. 10/18/22   Gayanne Prescott, Noberto Retort, PA-C  predniSONE (STERAPRED UNI-PAK 21 TAB) 10 MG (21) TBPK tablet As directed 10/18/22   Griffen Frayne K, PA-C  valACYclovir (VALTREX) 1000 MG tablet Take 1 tablet (1,000 mg total) by mouth 3 (three) times daily. 10/18/22   Yiannis Tulloch, Noberto Retort, PA-C    Family History Family History  Problem Relation Age of Onset   Cancer Mother 27       Gall bladder; found at time gallstone found; smoker   Hypertension Mother    Gallstones Mother    Other Mother        hx of hysterectomy for unspecified reason   Prostate cancer Father        dx early 65s; not very aggressive; s/p seed implants   Skin cancer Father        unspecified type; +sun exposure   Heart Problems Father        same as pt's; s/p surgery   Goiter Sister    Hypothyroidism Sister    Thyroid disease Sister    Cancer Maternal Grandmother        Gallbladder dx. 58-59; smoker   Heart disease Maternal Grandfather    Heart Problems Maternal Grandfather        heart disease   Heart Problems Paternal Uncle        heart disease, d. 52s; +EtOH abuse   Other Sister        hx of 1 benign breast biopys   Ovarian cancer Other        maternal great aunt (MGM's sister); lim info    Social History Social History   Tobacco Use   Smoking status: Never   Smokeless tobacco: Never   Tobacco comments:    only smoked very infrequently in college  Substance Use Topics   Alcohol use: Yes    Alcohol/week: 5.0 standard drinks of alcohol    Types: 5 Standard drinks or equivalent per week    Comment: 1 glass wine per day   Drug use: No     Allergies   Amoxicillin and Mupirocin   Review of Systems Review of Systems  Constitutional:  Positive for activity change. Negative for appetite change,  fatigue and fever.  Respiratory:  Negative for cough and shortness of breath.   Cardiovascular:  Negative for chest pain.  Gastrointestinal:  Negative for abdominal pain, diarrhea, nausea and vomiting.  Genitourinary:  Positive for flank pain. Negative for dysuria and urgency.  Skin:  Positive for rash.  Physical Exam Triage Vital Signs ED Triage Vitals  Enc Vitals Group     BP 10/18/22 1210 (!) 131/91     Pulse Rate 10/18/22 1210 98     Resp 10/18/22 1210 16     Temp 10/18/22 1210 98.9 F (37.2 C)     Temp Source 10/18/22 1210 Oral     SpO2 10/18/22 1210 98 %     Weight 10/18/22 1209 190 lb (86.2 kg)     Height 10/18/22 1209 5\' 6"  (1.676 m)     Head Circumference --      Peak Flow --      Pain Score 10/18/22 1209 6     Pain Loc --      Pain Edu? --      Excl. in GC? --    No data found.  Updated Vital Signs BP (!) 131/91 (BP Location: Left Arm)   Pulse 98   Temp 98.9 F (37.2 C) (Oral)   Resp 16   Ht 5\' 6"  (1.676 m)   Wt 190 lb (86.2 kg)   SpO2 98%   BMI 30.67 kg/m   Visual Acuity Right Eye Distance:   Left Eye Distance:   Bilateral Distance:    Right Eye Near:   Left Eye Near:    Bilateral Near:     Physical Exam Vitals reviewed.  Constitutional:      General: She is awake. She is not in acute distress.    Appearance: Normal appearance. She is well-developed. She is not ill-appearing.     Comments: Very pleasant female appears stated age in no acute distress sitting comfortably in exam room  HENT:     Head: Normocephalic and atraumatic.  Cardiovascular:     Rate and Rhythm: Normal rate and regular rhythm.     Heart sounds: S1 normal and S2 normal. Murmur heard.  Pulmonary:     Effort: Pulmonary effort is normal.     Breath sounds: Normal breath sounds. No wheezing, rhonchi or rales.     Comments: Clear to auscultation bilaterally Abdominal:     General: Bowel sounds are normal.     Palpations: Abdomen is soft.     Tenderness: There is no  abdominal tenderness. There is no right CVA tenderness, left CVA tenderness, guarding or rebound.  Skin:    Findings: Rash present. Rash is vesicular.          Comments: Vesicular rash noted to discrete areas on right back and thoracic and lumbar regions.  Psychiatric:        Behavior: Behavior is cooperative.      UC Treatments / Results  Labs (all labs ordered are listed, but only abnormal results are displayed) Labs Reviewed - No data to display  EKG   Radiology No results found.  Procedures Procedures (including critical care time)  Medications Ordered in UC Medications - No data to display  Initial Impression / Assessment and Plan / UC Course  I have reviewed the triage vital signs and the nursing notes.  Pertinent labs & imaging results that were available during my care of the patient were reviewed by me and considered in my medical decision making (see chart for details).     Symptoms are consistent with shingles.  Patient was started on valacyclovir 1000 mg 3 times daily for 1 week.  Given her significant neuropathic pain we also discussed starting prednisone taper for additional symptom relief.  Patient does have a history of aortic aneurysm related  to congenital heart defect but does have this repaired.  We did discuss that generally this medication is not recommended in individuals with aneurysm but she has safely taken this before and so we will try a short course.  She was also given gabapentin 300 mg to be taken at night to help manage symptoms.  Recommended that she avoid NSAIDs while on prednisone but can use Tylenol for breakthrough pain.  Discussed that she is contagious until rash heals she should avoid contact with anyone with a compromised immune system.  Recommend follow-up with her primary care if her symptoms are improving.  Discussed that if she has any worsening symptoms she should return for reevaluation.  Strict return precautions given.  Excuse note  provided.  Final Clinical Impressions(s) / UC Diagnoses   Final diagnoses:  Herpes zoster without complication     Discharge Instructions      You have shingles.  Please start valacyclovir 1000 mg 3 times daily for 1 week.  This will help your body manage the virus.  Start prednisone taper.  Do not take NSAIDs with this medication including aspirin, ibuprofen/Advil, naproxen/Aleve.  You can use Tylenol/acetaminophen for additional symptom relief.  Take gabapentin at night.  This make you sleepy so do not drive or drink alcohol with taking it.  Keep the area clean to prevent infection.  You are contagious until the lesions have healed.  Avoid contact with anyone with a weakened immune system including pregnant visuals, anyone on chemotherapy.  If you have any worsening or changing symptoms please return for reevaluation.    ED Prescriptions     Medication Sig Dispense Auth. Provider   valACYclovir (VALTREX) 1000 MG tablet Take 1 tablet (1,000 mg total) by mouth 3 (three) times daily. 21 tablet Chistine Dematteo K, PA-C   predniSONE (STERAPRED UNI-PAK 21 TAB) 10 MG (21) TBPK tablet As directed 21 tablet Lamin Chandley K, PA-C   gabapentin (NEURONTIN) 300 MG capsule Take 1 capsule (300 mg total) by mouth at bedtime. 14 capsule Abbigael Detlefsen K, PA-C      PDMP not reviewed this encounter.   Jeani Hawking, PA-C 10/18/22 1249    RaspetNoberto Retort, PA-C 10/18/22 1325

## 2022-10-18 NOTE — ED Triage Notes (Signed)
Patient here today with c/o right side flank pain with small spots on back that started on Wednesday. Patient thinks she may have shingles. She has not had Shingles before and has not gotten the vaccine. On Thursday, she went to her PCP and had her urine tested which was negative for UTI.

## 2022-11-03 ENCOUNTER — Ambulatory Visit: Payer: No Typology Code available for payment source | Admitting: Family Medicine

## 2022-11-03 ENCOUNTER — Encounter: Payer: Self-pay | Admitting: Family Medicine

## 2022-11-03 VITALS — BP 126/89 | HR 86 | Temp 98.3°F | Resp 18 | Ht 67.0 in | Wt 195.0 lb

## 2022-11-03 DIAGNOSIS — B028 Zoster with other complications: Secondary | ICD-10-CM | POA: Diagnosis not present

## 2022-11-03 MED ORDER — GABAPENTIN 300 MG PO CAPS: 300.0000 mg | ORAL_CAPSULE | Freq: Three times a day (TID) | ORAL | 1 refills | Status: DC

## 2022-11-03 NOTE — Patient Instructions (Signed)
Take tylenol and ibuprofen as well.

## 2022-11-03 NOTE — Progress Notes (Signed)
Subjective:     Patient ID: [name] Short, female    DOB: November 23, 1970, 52 y.o.   MRN: [id number]  Chief Complaint  Patient presents with   Pain    Still having pain from having shingles a few weeks ago    HPI Shingles-pain started end of May on right flank.  Thought urinary tract infection.  Next day-felt rash.  Went to urgent care on 6/1.   Got valtrex three times daily and gabapentin for night-time and prednisone-felt better, but then as finished prednisone, Pain worse.  Taking tylenol and advil now.  Felt great on prednisone.    Health Maintenance Due  Topic Date Due   HIV Screening  Never done   Hepatitis C Screening  Never done    Past Medical History:  Diagnosis Date   Breast feeding status of mother    Complication of anesthesia    Congenital heart defect    bicuspid aortic valve, aortic insufficeincy, ascending aortic aneurysm => s/p AVR (bioprosthetic) and ascending aortic aneurysm repair in 2008   Family history of adverse reaction to anesthesia    Dad who also had CAD (valve disease) "coded" with anesthesia    Heart murmur    History of uterine fibroid    Hx of cardiac cath    [facility] (2008, pre-surgery) with no coronary artery disease, EF 60% on LV-gram.   Hx of cardiovascular stress test    ETT-Echo (7/15):  normal   Hx of echocardiogram    Echo (7/15):  Mod LVH, EF 55-60%, no RWMA, + diast dysfunction, AVR ok (mean 16 mmHg), asc aorta 4 cm, trivial MR   Hypothyroidism    Multiple thyroid nodules    H/o goiter with thyroid surgery in 8/13   Palpitations 08/21/2020   PONV (postoperative nausea and vomiting)     Past Surgical History:  Procedure Laterality Date   AORTIC VALVE REPLACEMENT     with replacement of ascending aorta   BIOPSY  11/17/2019   Procedure: BIOPSY;  Surgeon: Charna Elizabeth, MD;  Location: [facility];  Service: Endoscopy;;   CARDIAC CATHETERIZATION     CARDIAC VALVE REPLACEMENT     CESAREAN SECTION  2011   CHOLECYSTECTOMY N/A 12/20/2015    Procedure: LAPAROSCOPIC CHOLECYSTECTOMY WITH INTRAOPERATIVE CHOLANGIOGRAM;  Surgeon: Harriette Bouillon, MD;  Location: [facility] OR;  Service: General;  Laterality: N/A;   COLONOSCOPY WITH PROPOFOL N/A 11/17/2019   Procedure: COLONOSCOPY WITH PROPOFOL;  Surgeon: Charna Elizabeth, MD;  Location: [facility];  Service: Endoscopy;  Laterality: N/A;   MYOMECTOMY     open heart surgery     THYROID LOBECTOMY  12/31/2011   Procedure: THYROID LOBECTOMY;  Surgeon: Serena Colonel, MD;  Location: [facility];  Service: ENT;  Laterality: Right;  Substernal Goiter Removal   THYROID LOBECTOMY  2003     Current Outpatient Medications:    aspirin EC 81 MG tablet, Take 1 tablet (81 mg total) by mouth daily., Disp: , Rfl:    clindamycin (CLEOCIN) 300 MG capsule, TAKE 2 CAPSULES BY MOUTH PRIOR TO ALL DENTAL APPOINTMENTS, Disp: , Rfl:    Drospirenone (SLYND) 4 MG TABS, , Disp: , Rfl:    VITAMIN D, CHOLECALCIFEROL, PO, Take by mouth., Disp: , Rfl:    gabapentin (NEURONTIN) 300 MG capsule, Take 1 capsule (300 mg total) by mouth 3 (three) times daily., Disp: 90 capsule, Rfl: 1  Allergies  Allergen Reactions   Amoxicillin Itching    Other reaction(s): Unknown   Mupirocin Itching, Swelling  and Other (See Comments)    Numbness of upper lip    ROS neg/noncontributory except as noted HPI/below      Objective:     BP 126/89   Pulse 86   Temp 98.3 F (36.8 C) (Temporal)   Resp 18   Ht 5\' 7"  (1.702 m)   Wt 195 lb (88.5 kg)   SpO2 100%   BMI 30.54 kg/m  Wt Readings from Last 3 Encounters:  11/03/22 195 lb (88.5 kg)  10/18/22 190 lb (86.2 kg)  10/16/22 193 lb 8 oz (87.8 kg)    Physical Exam   Gen: WDWN NAD HEENT: NCAT, conjunctiva not injected, sclera nonicteric MSK: no gross abnormalities.  NEURO: A&O x3.  CN II-XII intact.  PSYCH: normal mood. Good eye contact Remnants of probable shingles on right lower back/abdomen(not many).  Reviewed labs/notes     Assessment & Plan:  Herpes zoster with  complication  Other orders -     Gabapentin; Take 1 capsule (300 mg total) by mouth 3 (three) times daily.  Dispense: 90 capsule; Refill: 1  Herpes zoster of the right flank-treated appropriately with valacyclovir 3 times daily for 7 days and gabapentin at at bedtime and prednisone taper.  Patient was doing better, however pain persists.  Will do gabapentin 300 mg 3 times daily, Tylenol 3-4 times daily as needed, Advil every 6 hours as needed.  Hopefully, will resolve in the next couple of weeks.  Return if symptoms worsen or fail to improve.  Angelena Sole, MD

## 2022-11-17 ENCOUNTER — Encounter: Payer: BC Managed Care – PPO | Admitting: Family Medicine

## 2022-12-01 ENCOUNTER — Other Ambulatory Visit (HOSPITAL_BASED_OUTPATIENT_CLINIC_OR_DEPARTMENT_OTHER): Payer: Self-pay | Admitting: Cardiovascular Disease

## 2022-12-01 DIAGNOSIS — I7121 Aneurysm of the ascending aorta, without rupture: Secondary | ICD-10-CM

## 2022-12-03 ENCOUNTER — Ambulatory Visit (HOSPITAL_COMMUNITY): Payer: No Typology Code available for payment source

## 2022-12-08 ENCOUNTER — Encounter: Payer: Self-pay | Admitting: Family Medicine

## 2022-12-08 ENCOUNTER — Ambulatory Visit (INDEPENDENT_AMBULATORY_CARE_PROVIDER_SITE_OTHER): Payer: No Typology Code available for payment source | Admitting: Family Medicine

## 2022-12-08 VITALS — BP 100/70 | HR 83 | Temp 98.7°F | Resp 18 | Ht 67.0 in | Wt 192.1 lb

## 2022-12-08 DIAGNOSIS — M25552 Pain in left hip: Secondary | ICD-10-CM | POA: Diagnosis not present

## 2022-12-08 DIAGNOSIS — Z1159 Encounter for screening for other viral diseases: Secondary | ICD-10-CM | POA: Diagnosis not present

## 2022-12-08 DIAGNOSIS — Z0000 Encounter for general adult medical examination without abnormal findings: Secondary | ICD-10-CM

## 2022-12-08 DIAGNOSIS — M25551 Pain in right hip: Secondary | ICD-10-CM | POA: Diagnosis not present

## 2022-12-08 DIAGNOSIS — Z23 Encounter for immunization: Secondary | ICD-10-CM

## 2022-12-08 MED ORDER — MELOXICAM 7.5 MG PO TABS: 7.5000 mg | ORAL_TABLET | Freq: Every day | ORAL | 1 refills | Status: DC

## 2022-12-08 NOTE — Patient Instructions (Signed)
It was very nice to see you today!  Try tumeric for pain and low inflammatory diet.   PLEASE NOTE:  If you had any lab tests please let us know if you have not heard back within a few days. You may see your results on MyChart before we have a chance to review them but we will give you a call once they are reviewed by [place]. If we ordered any referrals today, please let us know if you have not heard from their office within the next week.   Please try these tips to maintain a healthy lifestyle:  Eat most of your calories during the day when you are active. Eliminate processed foods including packaged sweets (pies, cakes, cookies), reduce intake of potatoes, white bread, white pasta, and white rice. Look for whole grain options, oat flour or almond flour.  Each meal should contain half fruits/vegetables, one quarter protein, and one quarter carbs (no bigger than a computer mouse).  Cut down on sweet beverages. This includes juice, soda, and sweet tea. Also watch fruit intake, though this is a healthier sweet option, it still contains natural sugar! Limit to 3 servings daily.  Drink at least 1 glass of water with each meal and aim for at least 8 glasses per day  Exercise at least 150 minutes every week.

## 2022-12-08 NOTE — Progress Notes (Signed)
Phone [phone]   Subjective:   Patient is a 52 y.o. female presenting for annual physical.    Chief Complaint  Patient presents with   Annual Exam    CPE Not fasting    Annual-exercises some but hip inhibits  Hip pain - She complains of bilateral hip pain she attributes to her bursitis. Was seen by ortho earlier this year and was prescribed Meloxicam  which helped. Her rx recently ran out so has not been taking Meloxicam. Pain returned about a month ago. Has tried PT, but it did not have much of an effect on her pain. States she enjoys exercising, but her hip pain has limited her ability to exercise.    Constipation - Has occasional constipation. Takes colace and Miralax, which helps.   Anxiety - Has a tendency to worry, especially when watching the news which she does often. She does not feel she needs to manage with medication.   Cardiology f/u - She is scheduled for a follow up with Dr. Duke Salvia. Scheduled for MR-A on 8/26. Last MR-A was 10/31/2021.  Has had Covid since her last visit, tested positive on 6/24. Got it from her daughter, who was in a performance around the time.    See problem oriented charting- ROS- ROS: Gen: no fever, chills  Skin: no rash, itching ENT: no ear pain, ear drainage, nasal congestion, rhinorrhea, sinus pressure, sore throat Eyes: no blurry vision, double vision Resp: no cough, wheeze,SOB CV: no CP, palpitations, LE edema,  GI: no heartburn, n/v/d, abd pain GU: no dysuria, urgency, frequency, hematuria MSK:HPI Neuro: no dizziness, headache, weakness, vertigo Psych: no depression, insomnia, SI   The following were reviewed and entered/updated in epic: Past Medical History:  Diagnosis Date   Breast feeding status of mother    Complication of anesthesia    Congenital heart defect    bicuspid aortic valve, aortic insufficeincy, ascending aortic aneurysm => s/p AVR (bioprosthetic) and ascending aortic aneurysm repair in 2008   Family  history of adverse reaction to anesthesia    Dad who also had CAD (valve disease) "coded" with anesthesia    Heart murmur    History of uterine fibroid    Hx of cardiac cath    [facility] (2008, pre-surgery) with no coronary artery disease, EF 60% on LV-gram.   Hx of cardiovascular stress test    ETT-Echo (7/15):  normal   Hx of echocardiogram    Echo (7/15):  Mod LVH, EF 55-60%, no RWMA, + diast dysfunction, AVR ok (mean 16 mmHg), asc aorta 4 cm, trivial MR   Hypothyroidism    Multiple thyroid nodules    H/o goiter with thyroid surgery in 8/13   Palpitations 08/21/2020   PONV (postoperative nausea and vomiting)    Patient Active Problem List   Diagnosis Date Noted   Palpitations 08/21/2020   Acute otalgia, left 04/23/2018   Acute otitis externa of left ear 04/23/2018   History of bicuspid aortic valve 03/13/2017   Bilateral hearing loss due to cerumen impaction 03/03/2017   Family history of cancer of gallbladder 12/12/2015   S/P AVR 06/22/2012   Ascending aortic aneurysm (HCC) 06/22/2012   Aneurysm of aortic root (HCC) 01/06/2012   Nontoxic multinodular goiter 01/06/2012   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   Past Surgical History:  Procedure Laterality Date   AORTIC VALVE REPLACEMENT     with replacement of ascending aorta   BIOPSY  11/17/2019   Procedure: BIOPSY;  Surgeon: Charna Elizabeth, MD;  Location:  [facility];  Service: Endoscopy;;   CARDIAC CATHETERIZATION     CARDIAC VALVE REPLACEMENT     CESAREAN SECTION  2011   CHOLECYSTECTOMY N/A 12/20/2015   Procedure: LAPAROSCOPIC CHOLECYSTECTOMY WITH INTRAOPERATIVE CHOLANGIOGRAM;  Surgeon: Harriette Bouillon, MD;  Location: [facility] OR;  Service: General;  Laterality: N/A;   COLONOSCOPY WITH PROPOFOL N/A 11/17/2019   Procedure: COLONOSCOPY WITH PROPOFOL;  Surgeon: Charna Elizabeth, MD;  Location: [facility];  Service: Endoscopy;  Laterality: N/A;   MYOMECTOMY     open heart surgery     THYROID LOBECTOMY  12/31/2011   Procedure: THYROID LOBECTOMY;   Surgeon: Serena Colonel, MD;  Location: [facility];  Service: ENT;  Laterality: Right;  Substernal Goiter Removal   THYROID LOBECTOMY  2003    Family History  Problem Relation Age of Onset   Cancer Mother 68       Gall bladder; found at time gallstone found; smoker   Hypertension Mother    Gallstones Mother    Other Mother        hx of hysterectomy for unspecified reason   Prostate cancer Father        dx early 6s; not very aggressive; s/p seed implants   Skin cancer Father        unspecified type; +sun exposure   Heart Problems Father        same as pt's; s/p surgery   Goiter Sister    Hypothyroidism Sister    Thyroid disease Sister    Cancer Maternal Grandmother        Gallbladder dx. 58-59; smoker   Heart disease Maternal Grandfather    Heart Problems Maternal Grandfather        heart disease   Heart Problems Paternal Uncle        heart disease, d. 87s; +EtOH abuse   Other Sister        hx of 1 benign breast biopys   Ovarian cancer Other        maternal great aunt (MGM's sister); lim info    Medications- reviewed and updated Current Outpatient Medications  Medication Sig Dispense Refill   ALPRAZolam (XANAX) 0.5 MG tablet Take by mouth.     aspirin EC 81 MG tablet Take 1 tablet (81 mg total) by mouth daily.     clindamycin (CLEOCIN) 300 MG capsule TAKE 2 CAPSULES BY MOUTH PRIOR TO ALL DENTAL APPOINTMENTS     Drospirenone (SLYND) 4 MG TABS      meloxicam (MOBIC) 7.5 MG tablet Take 1 tablet (7.5 mg total) by mouth daily. 90 tablet 1   VITAMIN D, CHOLECALCIFEROL, PO Take by mouth.     No current facility-administered medications for this visit.    Allergies-reviewed and updated Allergies  Allergen Reactions   Amoxicillin Itching    Other reaction(s): Unknown   Mupirocin Itching, Swelling and Other (See Comments)    Numbness of upper lip     Social History   Social History Narrative   Retired Runner, broadcasting/film/video   Objective  Objective:  BP 100/70   Pulse 83   Temp 98.7 F  (37.1 C) (Temporal)   Resp 18   Ht 5\' 7"  (1.702 m)   Wt 192 lb 2 oz (87.1 kg)   SpO2 99%   BMI 30.09 kg/m  Physical Exam  Gen: WDWN NAD HEENT: NCAT, conjunctiva not injected, sclera nonicteric TM WNL B, OP moist, no exudates  NECK:  supple, no thyromegaly, no nodes, no carotid bruits CARDIAC: RRR, S1S2+. DP 2+B. +  2/6 murmur LUNGS: CTAB. No wheezes ABDOMEN:  BS+, soft, NTND, No HSM, no masses EXT:  no edema MSK: no gross abnormalities. MS 5/5 all 4 NEURO: A&O x3.  CN II-XII intact.  PSYCH: normal mood. Good eye contact     Assessment and Plan   Health Maintenance counseling: 1. Anticipatory guidance: Patient counseled regarding regular dental exams q6 months, eye exams,  avoiding smoking and second hand smoke, limiting alcohol to 1 beverage per day, no illicit drugs.   2. Risk factor reduction:  Advised patient of need for regular exercise and diet rich and fruits and vegetables to reduce risk of heart attack and stroke. Exercise- Exercises regularly, has lately been limited due to hip pain.  Wt Readings from Last 3 Encounters:  12/08/22 192 lb 2 oz (87.1 kg)  11/03/22 195 lb (88.5 kg)  10/18/22 190 lb (86.2 kg)   3. Immunizations/screenings/ancillary studies Immunization History  Administered Date(s) Administered   Influenza,inj,Quad PF,6+ Mos 04/13/2017   Influenza-Unspecified 02/18/2021   PFIZER(Purple Top)SARS-COV-2 Vaccination 07/31/2019, 08/20/2019, 03/01/2020, 10/29/2020   Pfizer Covid-19 Vaccine Bivalent Booster 44yrs & up 02/18/2021   Tdap 04/08/2014   Unspecified SARS-COV-2 Vaccination 02/18/2021   Zoster Recombinant(Shingrix) 12/08/2022   Health Maintenance Due  Topic Date Due   HIV Screening  Never done   Hepatitis C Screening  Never done    4. Cervical cancer screening- utd 5. Breast cancer screening-  mammogram last done 01/2021. 6. Colon cancer screening - UTD. Last done 11/17/2019. 7. Skin cancer screening- advised regular sunscreen use. Denies  worrisome, changing, or new skin lesions.  8. Birth control/STD check- on ocp 9. Osteoporosis screening- n/a 10. Smoking associated screening - non smoker  Wellness examination -     CBC with Differential/Platelet; Future -     Comprehensive metabolic panel; Future -     Lipid panel; Future -     TSH; Future -     Hemoglobin A1c; Future -     HIV Antibody (routine testing w rflx); Future -     Hepatitis C antibody; Future  Bilateral hip pain  Screening for viral disease -     HIV Antibody (routine testing w rflx); Future -     Hepatitis C antibody; Future  Need for shingles vaccine -     Varicella-zoster vaccine IM  Other orders -     Meloxicam; Take 1 tablet (7.5 mg total) by mouth daily.  Dispense: 90 tablet; Refill: 1   Wellness-anticipatory guidance.  Work on Diet/Exercise  Check CBC,CMP,lipids,TSH, A1C.  F/u 1 yr .  Shingrix #1 given Hip pain-tumeric.  Meloxicam 7.5mg  daily prn.  F/u ortho as inhibiting activities.   Recommended follow up: Return in about 1 year (around 12/08/2023) for annual physical.  Lab/Order associations: not fasting   I,Rachel Rivera,acting as a scribe for Angelena Sole, MD.,have documented all relevant documentation on the behalf of Angelena Sole, MD,as directed by  Angelena Sole, MD while in the presence of Angelena Sole, MD.  I, Angelena Sole, MD, have reviewed all documentation for this visit. The documentation on 12/08/22 for the exam, diagnosis, procedures, and orders are all accurate and complete.   Angelena Sole, MD

## 2022-12-10 ENCOUNTER — Telehealth (HOSPITAL_BASED_OUTPATIENT_CLINIC_OR_DEPARTMENT_OTHER): Payer: Self-pay | Admitting: *Deleted

## 2022-12-10 NOTE — Telephone Encounter (Signed)
Tried to call patient to discuss follow up visit, voicemail  Mychart message sent to patient

## 2022-12-10 NOTE — Telephone Encounter (Signed)
-----   Message from RMA Myna Hidalgo sent at 12/09/2022 10:54 AM EDT ----- Regarding: Resched. Appt? Pt appt previously rescheduled to 12/12/22 with Dr. Nebo----f/u MRA Chest,, but was scheduled later than previous appt date. MRA Chest has been rescheduled to 01/12/23. Should pt be rescheduled to see Dr. Duke Salvia after this is completed?  <3 Ladona Ridgel

## 2022-12-11 NOTE — Progress Notes (Signed)
Cardiology Office Note:  .    Date:  12/15/2022  ID:  [name] Short, DOB [birth year], MRN [id number] PCP: Jeani Sow, MD  [facility] Providers Cardiologist:  Chilton Si, MD     History of Present Illness: .    [name] Short is a 52 y.o. female with a bicuspid aortic valve and aortic aneysm s/p bioprosthetic AVR , who presents for a follow up.   [name] Short had a bicuspid aortic valve and developed severe aortic regurgitation and an ascending aortic aneurysm.  In 2008 she underwent bioprosthetic aortic valve replacement and supracoronary ascending aorta replacement.  Her last MRA 03/2017 showed a mild ascending aneurysm 4.2 cm, unchanged from prior.  She had an echo 12/2015 that showed LVEF 60-65% and normal diastolic function.  Mean gradient across the bioprosthetic valve was 16 mmHg.     She had a MR-A on 09/06/19 that revealed a stable ascending aortic aneurysm at 4.4 cm. She had a repeat echo 09/2020 with LVEF 60 to 65% and grade 1 diastolic dysfunction.  Her bioprosthetic valve was functioning well.  Aortic root was 3.9 cm.  She had been feeling well for the most part.    She is scheduled for an MR-A on 01/12/23. Her last MR-A was 10/31/2021 that revealed stable ascending aortic aneurysm at 3.4 cm.  Today, she is overall well. She notes she has gotten ill a couple times in the last year (stomach virus, Covid x2, UTIs and shingles). [name] Short states she's been struggling to lose weight despite her efforts to stay active. She has been exercising about 3-4 times a week, which has been difficulty for her to maintain during the summer. She has a new dog that she goes on walks with regularly. Diet wise, she mostly eats home cooked meals but a couple times a week may go to a [employer]. She has started eating whole wheat pastas and combing half white/half brown rice as healthier alternatives. She avoids fast food, sodas, and candies. Has had intolerances to amoxicillin  with itchiness to hands/feet. She denies any palpitations, chest pain, shortness of breath, peripheral edema, lightheadedness, headaches, syncope, orthopnea, or PND.  ROS:  Please see the history of present illness. All other systems are reviewed and negative.  (+)  Studies Reviewed: Marland Kitchen       MR-A Chest 10/2021: IMPRESSION: Stable aneurysmal disease of the ascending thoracic aorta measuring approximately 4.3 cm in maximum diameter.    Risk Assessment/Calculations:     Physical Exam:    VS:  BP 100/76   Pulse 91   Ht 5\' 6"  (1.676 m)   Wt 190 lb 8 oz (86.4 kg)   SpO2 97%   BMI 30.75 kg/m  , BMI Body mass index is 30.75 kg/m. GENERAL:  Well appearing HEENT: Pupils equal round and reactive, fundi not visualized, oral mucosa unremarkable NECK:  No jugular venous distention, waveform within normal limits, carotid upstroke brisk and symmetric, no bruits, no thyromegaly LUNGS:  Clear to auscultation bilaterally HEART:  RRR.  PMI not displaced or sustained,S1 and S2 within normal limits, no S3, no S4, no clicks, no rubs, 2/6 systolic murmur ABD:  Flat, positive bowel sounds normal in frequency in pitch, no bruits, no rebound, no guarding, no midline pulsatile mass, no hepatomegaly, no splenomegaly EXT:  2 plus pulses throughout, no edema, no cyanosis no clubbing SKIN:  No rashes no nodules NEURO:  Cranial nerves II through XII grossly intact, motor grossly intact throughout PSYCH:  Cognitively intact, oriented to person place and time  Wt Readings from Last 3 Encounters:  12/12/22 190 lb 8 oz (86.4 kg)  12/08/22 192 lb 2 oz (87.1 kg)  11/03/22 195 lb (88.5 kg)     ASSESSMENT AND PLAN: .       # Aortic Aneurysm: Stable at 4.3 cm. No symptoms of chest pain, shortness of breath, or swelling. -Schedule MRI in August 2024 to monitor aneurysm.  # Bicuspid Aortic Valve s/p Bioprosthetic AVR in 2008: Mean gradient 13 mmHg in 2023.  She has no heart failure symptoms.  Early-peaking murmur  on exam. -Schedule echocardiogram in 2025 to monitor valve function.  # Weight Management: Difficulty losing weight despite regular exercise. Discussed the importance of diet modification and portion control. -Consider referral to Healthy Weight and Wellness program for tailored meal planning and psychological support.  # Urinary Tract Infections: Recent UTI treated with Ciprofloxacin. Noted potential risks associated with Ciprofloxacin use in patients with aortic aneurysms. -For future UTIs, consider alternative antibiotics   # General Health Maintenance: -Order labs today including lipid panel, CMP, A1c, and TSH. -Follow-up appointment in 1 year or sooner if any issues arise.              Dispo:  FU with Bransen Fassnacht C. Duke Salvia, MD, [facility] in 1 year, after echo.   I,Rachel Rivera,acting as a Neurosurgeon for Chilton Si, MD.,have documented all relevant documentation on the behalf of Chilton Si, MD,as directed by  Chilton Si, MD while in the presence of Chilton Si, MD.  I, Demaris Bousquet C. Duke Salvia, MD have reviewed all documentation for this visit.  The documentation of the exam, diagnosis, procedures, and orders on 12/15/2022 are all accurate and complete.   Signed, Chilton Si, MD

## 2022-12-12 ENCOUNTER — Ambulatory Visit (HOSPITAL_BASED_OUTPATIENT_CLINIC_OR_DEPARTMENT_OTHER): Payer: No Typology Code available for payment source | Admitting: Cardiovascular Disease

## 2022-12-12 ENCOUNTER — Encounter (HOSPITAL_BASED_OUTPATIENT_CLINIC_OR_DEPARTMENT_OTHER): Payer: Self-pay | Admitting: Cardiovascular Disease

## 2022-12-12 VITALS — BP 100/76 | HR 91 | Ht 66.0 in | Wt 190.5 lb

## 2022-12-12 DIAGNOSIS — R002 Palpitations: Secondary | ICD-10-CM | POA: Diagnosis not present

## 2022-12-12 DIAGNOSIS — I7121 Aneurysm of the ascending aorta, without rupture: Secondary | ICD-10-CM | POA: Diagnosis not present

## 2022-12-12 DIAGNOSIS — Z952 Presence of prosthetic heart valve: Secondary | ICD-10-CM | POA: Diagnosis not present

## 2022-12-12 LAB — COMPREHENSIVE METABOLIC PANEL
ALT: 13 IU/L (ref 0–32)
AST: 20 IU/L (ref 0–40)
Albumin: 4.7 g/dL (ref 3.8–4.9)
Alkaline Phosphatase: 73 IU/L (ref 44–121)
BUN/Creatinine Ratio: 12 (ref 9–23)
BUN: 12 mg/dL (ref 6–24)
Bilirubin Total: 0.5 mg/dL (ref 0.0–1.2)
CO2: 23 mmol/L (ref 20–29)
Calcium: 9.9 mg/dL (ref 8.7–10.2)
Chloride: 105 mmol/L (ref 96–106)
Creatinine, Ser: 0.99 mg/dL (ref 0.57–1.00)
Globulin, Total: 2.4 g/dL (ref 1.5–4.5)
Glucose: 98 mg/dL (ref 70–99)
Potassium: 4.3 mmol/L (ref 3.5–5.2)
Sodium: 141 mmol/L (ref 134–144)
Total Protein: 7.1 g/dL (ref 6.0–8.5)
eGFR: 68 mL/min/{1.73_m2} (ref >59)

## 2022-12-12 LAB — LIPID PANEL
Chol/HDL Ratio: 5.5 ratio — ABNORMAL HIGH (ref 0.0–4.4)
Cholesterol, Total: 248 mg/dL — ABNORMAL HIGH (ref 100–199)
HDL: 45 mg/dL (ref >39)
LDL Chol Calc (NIH): 177 mg/dL — ABNORMAL HIGH (ref 0–99)
Triglycerides: 142 mg/dL (ref 0–149)
VLDL Cholesterol Cal: 26 mg/dL (ref 5–40)

## 2022-12-12 LAB — CBC
Hematocrit: 44.3 % (ref 34.0–46.6)
Hemoglobin: 15 g/dL (ref 11.1–15.9)
MCH: 30.8 pg (ref 26.6–33.0)
MCHC: 33.9 g/dL (ref 31.5–35.7)
MCV: 91 fL (ref 79–97)
Platelets: 192 10*3/uL (ref 150–450)
RBC: 4.87 x10E6/uL (ref 3.77–5.28)
RDW: 12.6 % (ref 11.7–15.4)
WBC: 4.5 10*3/uL (ref 3.4–10.8)

## 2022-12-12 LAB — HEMOGLOBIN A1C
Est. average glucose Bld gHb Est-mCnc: 117 mg/dL
Hgb A1c MFr Bld: 5.7 % — ABNORMAL HIGH (ref 4.8–5.6)

## 2022-12-12 LAB — TSH: TSH: 1.47 u[IU]/mL (ref 0.450–4.500)

## 2022-12-12 NOTE — Patient Instructions (Signed)
Medication Instructions:  None  *If you need a refill on your cardiac medications before your next appointment, please call your pharmacy*   Lab Work: Lipids, CMP, Hgb A1C, TSH CBC  If you have labs (blood work) drawn today and your tests are completely normal, you will receive your results only by: MyChart Message (if you have MyChart) OR A paper copy in the mail If you have any lab test that is abnormal or we need to change your treatment, we will call you to review the results.   Testing/Procedures: ECHO in  one year   Follow-Up: At [facility], you and your health needs are our priority.  As part of our continuing mission to provide you with exceptional heart care, we have created designated Provider Care Teams.  These Care Teams include your primary Cardiologist (physician) and Advanced Practice Providers (APPs -  Physician Assistants and Nurse Practitioners) who all work together to provide you with the care you need, when you need it.  We recommend signing up for the patient portal called "MyChart".  Sign up information is provided on this After Visit Summary.  MyChart is used to connect with patients for Virtual Visits (Telemedicine).  Patients are able to view lab/test results, encounter notes, upcoming appointments, etc.  Non-urgent messages can be sent to your provider as well.   To learn more about what you can do with MyChart, go to [email or web address].    Your next appointment:   1 year(s) [address]  Provider:       Other Instructions None

## 2022-12-15 ENCOUNTER — Encounter (HOSPITAL_BASED_OUTPATIENT_CLINIC_OR_DEPARTMENT_OTHER): Payer: Self-pay | Admitting: Cardiovascular Disease

## 2023-01-06 ENCOUNTER — Encounter (HOSPITAL_BASED_OUTPATIENT_CLINIC_OR_DEPARTMENT_OTHER): Payer: Self-pay | Admitting: Cardiovascular Disease

## 2023-01-09 ENCOUNTER — Encounter (HOSPITAL_BASED_OUTPATIENT_CLINIC_OR_DEPARTMENT_OTHER): Payer: Self-pay | Admitting: Cardiovascular Disease

## 2023-01-12 ENCOUNTER — Ambulatory Visit
Admission: RE | Admit: 2023-01-12 | Discharge: 2023-01-12 | Disposition: A | Discharge status: Home / Self Care | Payer: No Typology Code available for payment source | Source: Ambulatory Visit | Attending: Cardiovascular Disease

## 2023-01-12 DIAGNOSIS — I7121 Aneurysm of the ascending aorta, without rupture: Secondary | ICD-10-CM

## 2023-01-12 MED ORDER — GADOPICLENOL 0.5 MMOL/ML IV SOLN
7.5000 mL | Freq: Once | INTRAVENOUS | Status: AC | PRN
  Administered 2023-01-12: 7.5 mL via INTRAVENOUS

## 2023-01-26 ENCOUNTER — Other Ambulatory Visit: Payer: Self-pay | Admitting: Family Medicine

## 2023-01-26 ENCOUNTER — Telehealth: Payer: Self-pay | Admitting: Family Medicine

## 2023-01-26 MED ORDER — MELOXICAM 15 MG PO TABS: 15.0000 mg | ORAL_TABLET | Freq: Every day | ORAL | 1 refills | Status: DC | PRN

## 2023-01-26 NOTE — Telephone Encounter (Signed)
Patient states she has been taking 2 tablets of the following RX because 1 tablet was not working.  Requests new RX with dosage increase of 15 MG:  Prescription Request  01/26/2023  LOV: 12/08/2022  What is the name of the medication or equipment? meloxicam (MOBIC) tablet (with dosage increase listed above)  Have you contacted your pharmacy to request a refill? Yes   Which pharmacy would you like this sent to?  [id number] G[place], [place] - [address] AT [facility] [address] [facility] [address] [place] [address] Phone: [phone] Fax: [phone]    Patient notified that their request is being sent to the clinical staff for review and that they should receive a response within 2 business days.   Please advise at Mobile [phone] (mobile)

## 2023-01-26 NOTE — Telephone Encounter (Signed)
Please see message below and advise.

## 2023-01-28 ENCOUNTER — Telehealth (HOSPITAL_BASED_OUTPATIENT_CLINIC_OR_DEPARTMENT_OTHER): Payer: Self-pay

## 2023-01-28 DIAGNOSIS — I7121 Aneurysm of the ascending aorta, without rupture: Secondary | ICD-10-CM

## 2023-01-28 NOTE — Addendum Note (Signed)
Addended by: Marlene Lard on: 01/28/2023 04:19 PM   Modules accepted: Orders

## 2023-01-28 NOTE — Telephone Encounter (Addendum)
Seen by patient [name] Short on 01/28/2023  4:09 PM; follow up mychart message sent to patient    ----- Message from [facility] sent at 01/28/2023  3:03 PM EDT ----- Aorta is slightly increased in size compared to 2023.  It was 4.3 cm.  Now 4.5/4.6cm.  Recommend getting a CT-A in 6 months.  Establish with Vascular after the imaging in 6 months.  We aren't at a point where anything needs to be done.  Just close follow up.

## 2023-06-30 NOTE — Progress Notes (Signed)
 Received call from Koren S.from [employer] requesting CPT codes for the approval of the patient's NCV exam.  Called the number back and spoke with rep and gave them the code requested.

## 2023-07-15 NOTE — Progress Notes (Signed)
 [name] Short MRN: [id number] DOB: 03-23-1971 (age: 53 y.o.)   Return Visit PCP:  No primary care provider on file. Chief Complaint:  Chief Complaint  Patient presents with  . Left Hand - Pain    Right > Left hand pain, Go over NCV  . Right Hand - Pain    Rigth > Left hand pain   Last Visit: 06/17/2023  History of Present Illness: [name] Short is being seen in follow-up regarding bilateral carpal tunnel syndrome.  She has had her nerve conduction studies and is here to discuss the results.  She notes continued numbness and tingling in the hands.  She has nocturnal symptoms.  Allergies:  Allergies  Allergen Reactions  . Quinolones     Increased risk of aortic dissection  . Amoxicillin Itching and Other (See Comments)    Other Reaction(s): Unknown  Other reaction(s): Unknown  . Mupirocin Itching and Swelling    Other Reaction(s): Other (See Comments)  Numbness of upper lip    Past Medical History: No past medical history on file.  Past Surgical History: No past surgical history on file.  Medications:  Current Outpatient Medications on File Prior to Visit  Medication Sig Dispense Refill  . ALPRAZolam  (XANAX ) 0.5 mg tablet Take by mouth.    SABRA aspirin 81 mg chewable tablet Chew 1 tablet daily.    . cholecalciferol (VITAMIN D3) 2,000 unit tablet Take 1 tablet by mouth daily.    . cholecalciferol 25 mcg (1,000 unit) cap Take by mouth.    . clindamycin  (CLEOCIN ) 300 mg capsule TAKE 2 CAPSULES BY MOUTH PRIOR TO ALL DENTAL APPOINTMENTS    . drospirenone, contraceptive, (Slynd) 4 mg (28) tab Take 1 tablet by mouth daily.    . meloxicam  (MOBIC ) 15 mg tablet     . TURMERIC ORAL Take 1 tablet by mouth daily.    . [DISCONTINUED] meloxicam  (MOBIC ) 7.5 mg tablet  (Patient not taking: Reported on 07/15/2023)     No current facility-administered medications on file prior to visit.    Family History: No family history on file.  Social History:    Vitals:  Vitals:   07/15/23 0925  BP:  104/70  Pulse: 94    Physical Exam:  Alert and oriented x 3. Well developed, well nourished. Regular gait.  Bilateral upper extremities: Intact to light touch sensation and capillary refill in the fingertips.  There is good soft tissue turgor in the fingertips.  She can flex and extend the IP joint of the thumb and can cross her fingers.   No wounds. No swelling, erythema, or ecchymosis. No rashes or lesions.  No signs or symptoms of dystrophy.   Compartments are soft. She has a positive Tinel's, Phalen's, and Durkan's at the median nerve at the carpal tunnel.    Special Investigations- Review of Diagnostic Tests:   Radiologic Studies: No radiographs taken or reviewed today.  Diagnostic studies: Nerve conduction studies from July 02, 2023 show sensory latencies of the median nerve at the carpal tunnel of 5.0 on the left and no response on the right with normal being less than 3.3 and motor latencies of 5.1 on the left and 7.2 on the right with normal being less than 4.4.  Ulnar nerve conduction velocities above and below elbow 76 and 61.  Cross-sectional area of the median nerve on the right 5 mm in the forearm and 15 mm at the wrist and on the left 5 mm in the forearm and 20 mm at the  wrist.  This is consistent with bilateral carpal tunnel syndrome.  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  Assessment:    ICD-10-CM   1. Bilateral carpal tunnel syndrome  G56.03       Plan: I discussed with [name] Short the nature of carpal tunnel syndrome.  We discussed treatment options including acceptance, day and/or nighttime splinting, injection of the carpal tunnel, and carpal tunnel release.  At this time she would like to proceed with surgical release.  She has to figure out timing of this based on some other cardiac issues that she has to have addressed.  She is  going to figure these things out in the next couple of weeks and contact us  for scheduling when she knows.   We discussed that nocturnal symptoms tend to improve relatively quickly after surgery.  Numbness and tingling tends to improve though may not resolve entirely.  Muscular atrophy does not change.  The risks, benefits, and alternatives of surgery were discussed including the risks of blood loss, infection, damage to nerve/vessel/tendon/ligament/bone, failure of surgery, need for additional surgery, complications of wounds healing, recurrence of carpal tunnel syndrome, and damage to motor branch.   She voiced understanding of the risks and elected to proceed.  We will have this arranged at her convenience.  She agrees with the plan of care.   Follow up: Return for Patient will call with decision regarding surgery.  No orders of the defined types were placed in this encounter.

## 2023-07-17 LAB — BASIC METABOLIC PANEL
BUN/Creatinine Ratio: 20 (ref 9–23)
BUN: 18 mg/dL (ref 6–24)
CO2: 23 mmol/L (ref 20–29)
Calcium: 10 mg/dL (ref 8.7–10.2)
Chloride: 101 mmol/L (ref 96–106)
Creatinine, Ser: 0.89 mg/dL (ref 0.57–1.00)
Glucose: 89 mg/dL (ref 70–99)
Potassium: 4.7 mmol/L (ref 3.5–5.2)
Sodium: 140 mmol/L (ref 134–144)
eGFR: 77 mL/min/{1.73_m2} (ref >59)

## 2023-07-23 ENCOUNTER — Telehealth (HOSPITAL_BASED_OUTPATIENT_CLINIC_OR_DEPARTMENT_OTHER): Payer: Self-pay | Admitting: *Deleted

## 2023-07-23 ENCOUNTER — Encounter (HOSPITAL_BASED_OUTPATIENT_CLINIC_OR_DEPARTMENT_OTHER): Payer: Self-pay

## 2023-07-23 DIAGNOSIS — I7121 Aneurysm of the ascending aorta, without rupture: Secondary | ICD-10-CM

## 2023-07-27 ENCOUNTER — Ambulatory Visit (HOSPITAL_COMMUNITY): Payer: No Typology Code available for payment source

## 2023-07-27 ENCOUNTER — Ambulatory Visit (HOSPITAL_BASED_OUTPATIENT_CLINIC_OR_DEPARTMENT_OTHER)
Admission: RE | Admit: 2023-07-27 | Discharge: 2023-07-27 | Disposition: A | Discharge status: Home / Self Care | Source: Ambulatory Visit | Attending: Cardiovascular Disease | Admitting: Cardiovascular Disease

## 2023-07-27 DIAGNOSIS — I7121 Aneurysm of the ascending aorta, without rupture: Secondary | ICD-10-CM | POA: Insufficient documentation

## 2023-07-27 MED ORDER — IOHEXOL 350 MG/ML SOLN
100.0000 mL | Freq: Once | INTRAVENOUS | Status: AC | PRN
  Administered 2023-07-27: 100 mL via INTRAVENOUS

## 2023-07-31 ENCOUNTER — Encounter (HOSPITAL_BASED_OUTPATIENT_CLINIC_OR_DEPARTMENT_OTHER): Payer: Self-pay | Admitting: Cardiovascular Disease

## 2023-07-31 NOTE — Telephone Encounter (Signed)
 Pt. Had scan done, no need for open encounter at this time

## 2023-08-10 NOTE — Telephone Encounter (Signed)
 Called [place] radiology to follow up on read of scan, transferred to reading room. No answer, left secure message for call back.

## 2023-08-17 ENCOUNTER — Encounter (HOSPITAL_BASED_OUTPATIENT_CLINIC_OR_DEPARTMENT_OTHER): Payer: Self-pay | Admitting: *Deleted

## 2023-08-17 ENCOUNTER — Telehealth (HOSPITAL_BASED_OUTPATIENT_CLINIC_OR_DEPARTMENT_OTHER): Payer: Self-pay | Admitting: *Deleted

## 2023-08-17 DIAGNOSIS — I7121 Aneurysm of the ascending aorta, without rupture: Secondary | ICD-10-CM

## 2023-08-17 NOTE — Telephone Encounter (Signed)
 Patient reviewed comments in mychart Order and referral placed

## 2023-08-17 NOTE — Telephone Encounter (Signed)
-----   Message from [facility] sent at 08/16/2023  5:05 PM EDT ----- Study shows that the aorta is slightly increased from 4.6 cm to 4.8 cm.  Recommend getting a CT-A in 6 months and following up with vascular surgery after.  We don't need to consider surgery unless it is 5.0 cm, but it is good to meet with them.

## 2023-08-19 ENCOUNTER — Encounter: Payer: Self-pay | Admitting: Surgery

## 2023-08-19 ENCOUNTER — Other Ambulatory Visit: Payer: Self-pay | Admitting: Cardiothoracic Surgery

## 2023-09-07 ENCOUNTER — Encounter: Admitting: Surgery

## 2023-10-07 ENCOUNTER — Ambulatory Visit: Attending: Surgery | Admitting: Surgery

## 2023-10-07 VITALS — BP 128/93 | HR 98 | Resp 18 | Ht 66.0 in | Wt 196.0 lb

## 2023-10-07 DIAGNOSIS — Z952 Presence of prosthetic heart valve: Secondary | ICD-10-CM | POA: Diagnosis not present

## 2023-10-07 DIAGNOSIS — I7121 Aneurysm of the ascending aorta, without rupture: Secondary | ICD-10-CM | POA: Diagnosis not present

## 2023-10-10 NOTE — Progress Notes (Signed)
 Cardiothoracic Surgery Consultation  PCP is Christel Cousins, MD Referring Provider is Maudine Sos, MD  Chief Complaint  Patient presents with   Thoracic Aortic Aneurysm    Surgical consult, Chest CT 07/27/23    HPI:  The patient is a 53 year old woman who underwent replacement of an ascending aortic aneurysm using a 30 mm Hemashield Dacron supra-coronary graft and replacement of the aortic valve with a 25 mm Edwards model 2700 pericardial valve for severe bicuspid aortic insufficiency on 11/03/2006 by Dr. Nicanor Barge.  She has been followed with serial MRA or CTA studies for aortic surveillance.  MRA of the chest in June 2023 measured the distal ascending aorta beyond the repair to be 4.3 cm.  In August 2024 this measurement was 4.5-4.6 cm.  Her most recent CTA on 07/27/2023 measured the same area at 4.8 cm.  The aortic root at the sinus level measured 40 mm in maximum dimension.  Sinotubular junction was 38 mm.  The aortic arch was measured at 37 mm with the descending aorta at the level of the carina at 26 mm.  Her last echocardiogram in May 2022 showed a normally functioning prosthetic aortic valve with a mean gradient of 12.8 mmHg.  She continues to feel well without chest pain, fatigue or shortness of breath.  She said that she had a bioprosthetic valve placed at her original surgery because she still wanted to have children which she has done.   Past Medical History:  Diagnosis Date   Breast feeding status of mother    Complication of anesthesia    Congenital heart defect    bicuspid aortic valve, aortic insufficeincy, ascending aortic aneurysm => s/p AVR (bioprosthetic) and ascending aortic aneurysm repair in 2008   Family history of adverse reaction to anesthesia    Dad who also had CAD (valve disease) "coded" with anesthesia    Heart murmur    History of uterine fibroid    Hx of cardiac cath    [facility] (2008, pre-surgery) with no coronary artery disease, EF 60% on LV-gram.   Hx  of cardiovascular stress test    ETT-Echo (7/15):  normal   Hx of echocardiogram    Echo (7/15):  Mod LVH, EF 55-60%, no RWMA, + diast dysfunction, AVR ok (mean 16 mmHg), asc aorta 4 cm, trivial MR   Hypothyroidism    Multiple thyroid  nodules    H/o goiter with thyroid  surgery in 8/13   Palpitations 08/21/2020   PONV (postoperative nausea and vomiting)     Past Surgical History:  Procedure Laterality Date   AORTIC VALVE REPLACEMENT     with replacement of ascending aorta   BIOPSY  11/17/2019   Procedure: BIOPSY;  Surgeon: Tami Falcon, MD;  Location: [facility];  Service: Endoscopy;;   CARDIAC CATHETERIZATION     CARDIAC VALVE REPLACEMENT     CESAREAN SECTION  2011   CHOLECYSTECTOMY N/A 12/20/2015   Procedure: LAPAROSCOPIC CHOLECYSTECTOMY WITH INTRAOPERATIVE CHOLANGIOGRAM;  Surgeon: Sim Dryer, MD;  Location: [facility] OR;  Service: General;  Laterality: N/A;   COLONOSCOPY WITH PROPOFOL  N/A 11/17/2019   Procedure: COLONOSCOPY WITH PROPOFOL ;  Surgeon: Tami Falcon, MD;  Location: [facility];  Service: Endoscopy;  Laterality: N/A;   MYOMECTOMY     open heart surgery     THYROID  LOBECTOMY  12/31/2011   Procedure: THYROID  LOBECTOMY;  Surgeon: Janita Mellow, MD;  Location: [facility] OR;  Service: ENT;  Laterality: Right;  Substernal Goiter Removal   THYROID  LOBECTOMY  2003  Family History  Problem Relation Age of Onset   Cancer Mother 18       Gall bladder; found at time gallstone found; smoker   Hypertension Mother    Gallstones Mother    Other Mother        hx of hysterectomy for unspecified reason   Prostate cancer Father        dx early 28s; not very aggressive; s/p seed implants   Skin cancer Father        unspecified type; +sun exposure   Heart Problems Father        same as pt's; s/p surgery   Goiter Sister    Hypothyroidism Sister    Thyroid  disease Sister    Cancer Maternal Grandmother        Gallbladder dx. 58-59; smoker   Heart disease Maternal Grandfather    Heart Problems  Maternal Grandfather        heart disease   Heart Problems Paternal Uncle        heart disease, d. 51s; +EtOH abuse   Other Sister        hx of 1 benign breast biopys   Ovarian cancer Other        maternal great aunt (MGM's sister); lim info    Social History Social History   Tobacco Use   Smoking status: Never   Smokeless tobacco: Never   Tobacco comments:    only smoked very infrequently in college  Vaping Use   Vaping status: Never Used  Substance Use Topics   Alcohol use: Yes    Alcohol/week: 5.0 standard drinks of alcohol    Types: 5 Standard drinks or equivalent per week    Comment: 1 glass wine per day   Drug use: No    Current Outpatient Medications  Medication Sig Dispense Refill   aspirin EC 81 MG tablet Take 1 tablet (81 mg total) by mouth daily.     calcium carbonate (OSCAL) 1500 (600 Ca) MG TABS tablet Take by mouth daily with breakfast.     clindamycin  (CLEOCIN ) 300 MG capsule TAKE 2 CAPSULES BY MOUTH PRIOR TO ALL DENTAL APPOINTMENTS     meloxicam  (MOBIC ) 15 MG tablet Take 1 tablet (15 mg total) by mouth daily as needed. 90 tablet 1   VITAMIN D, CHOLECALCIFEROL, PO Take by mouth.     No current facility-administered medications for this visit.    Allergies  Allergen Reactions   Quinolones     Increased risk of aortic dissection   Amoxicillin Itching    Other reaction(s): Unknown   Mupirocin Itching, Swelling and Other (See Comments)    Numbness of upper lip     Review of Systems  Constitutional: Negative.   Eyes: Negative.   Respiratory: Negative.    Cardiovascular: Negative.   Musculoskeletal: Negative.   Neurological: Negative.     BP (!) 128/93   Pulse 98   Resp 18   Ht 5\' 6"  (1.676 m)   Wt 196 lb (88.9 kg)   SpO2 99%   BMI 31.64 kg/m  Physical Exam Constitutional:      Appearance: Normal appearance.  Neck:     Vascular: No carotid bruit.  Cardiovascular:     Rate and Rhythm: Normal rate and regular rhythm.     Comments: 2/6  systolic flow murmur. No diastolic murmur. Pulmonary:     Effort: Pulmonary effort is normal.     Breath sounds: Normal breath sounds.  Neurological:  Mental Status: She is alert.     Diagnostic Tests:  Narrative & Impression  CLINICAL DATA:  53 year old female with history of thoracic aortic aneurysm. Follow-up study.   EXAM: CT ANGIOGRAPHY CHEST WITH CONTRAST   TECHNIQUE: Multidetector CT imaging of the chest was performed using the standard protocol during bolus administration of intravenous contrast. Multiplanar CT image reconstructions and MIPs were obtained to evaluate the vascular anatomy.   RADIATION DOSE REDUCTION: This exam was performed according to the departmental dose-optimization program which includes automated exposure control, adjustment of the mA and/or kV according to patient size and/or use of iterative reconstruction technique.   CONTRAST:  100 mL Omnipaque  350, intravenous   COMPARISON:  MRA chest from 01/12/2023   FINDINGS: Cardiovascular: Preferential opacification of the thoracic aorta. No evidence of thoracic aortic dissection. Normal heart size. No pericardial effusion.   Sinues of Valsalva: 40 mm 36 x 37 mm   Sinotubular Junction: 38 mm   Ascending Aorta: Status post tube graft repair proximally, measures up to 48 mm distally   Aortic Arch: 37 mm   Descending aorta: 26 mm at the level of the carina   Branch vessels: Conventional branching pattern. No significant atherosclerotic changes.   Coronary arteries: Normal origins and courses. No significant atherosclerotic calcifications.   Main pulmonary artery: 33 mm. No evidence of central pulmonary embolism.   Pulmonary veins: No anomalous pulmonary venous return. No evidence of left atrial appendage thrombus.   Upper abdominal vasculature: Within normal limits.   Mediastinum/Nodes: No enlarged mediastinal, hilar, or axillary lymph nodes. Thyroid  gland, trachea, and esophagus  demonstrate no significant findings.   Lungs/Pleura: No focal consolidations. No suspicious pulmonary nodules. No pleural effusion or pneumothorax.   Upper Abdomen: The visualized upper abdomen is within normal limits.   Musculoskeletal: No chest wall abnormality. Healed median sternotomy.   Review of the MIP images confirms the above findings.   IMPRESSION: Vascular:   1. Fusiform ascending thoracic aortic aneurysm measuring up to 4.8 cm. Ascending thoracic aortic aneurysm. Recommend semi-annual imaging followup by CTA or MRA and referral to cardiothoracic surgery if not already obtained. This recommendation follows 2010 ACCF/AHA/AATS/ACR/ASA/SCA/SCAI/SIR/STS/SVM Guidelines for the Diagnosis and Management of Patients With Thoracic Aortic Disease. Circulation. 2010; 121: Z610-R604. Aortic aneurysm NOS (ICD10-I71.9) 2. Postsurgical changes after aortic root repair without complicating features.   Non-Vascular:   No acute or significant thoracic abnormality.   Creasie Doctor, MD   [facility]   [facility]     Electronically Signed   By: Creasie Doctor M.D.   On: 08/12/2023 14:23      Impression:  This 53 year old woman has a history of bicuspid aortic valve insufficiency and 5.2 cm ascending aortic aneurysm status post AVR with a pericardial valve and supra-coronary ascending aortic replacement by Dr. Nicanor Barge in 2008.  This ascending aortic graft was anastomosed to the distal ascending aorta with a cross-clamp in place leaving a section of ascending aorta before the innominate artery.  This residual ascending aorta has enlarged over time.  Radiology measured this area at 4.8 cm on the most recent CTA of the chest but there is significant motion artifact.  I measure this area at 4.6 cm.  I went back and looked at the CTA prior to her surgery in 2008 and I measured this area of the aorta at 4.1 cm.  I think there has been  gradual enlargement over the years but I think the current measurement is about 4.6  cm.  This is still below the usual surgical threshold of around 5 cm.  I reviewed the CT images with her and answered all of her questions.  I recommended that she have a follow-up CTA of the chest in 1 year.  I stressed the importance of continued good blood pressure control in preventing further enlargement and acute aortic dissection.  Her pericardial aortic valve appears to be functioning well at least by echocardiogram in 2022 and she remains asymptomatic.  This valve is 55 years old and certainly at increased risk for deterioration going forward.  This should be followed with periodic echocardiograms.  Deterioration of this valve may also drive the decision about when to operate on her remaining ascending aorta.  Plan:  I will see her back in 1 year with a CTA of the chest for aortic surveillance.  She will continue to follow-up with her PCP and Dr. Theodis Fiscal for her cardiology care.  Dr. Theodis Fiscal will decide when to repeat her echocardiogram.  I spent 45 minutes performing this consultation and > 50% of this time was spent face to face counseling and coordinating the care of this patient's ascending aortic aneurysm.  Bartley Lightning, MD [facility] [phone]

## 2023-11-24 ENCOUNTER — Ambulatory Visit: Admitting: Orthopedic Surgery

## 2023-11-24 DIAGNOSIS — G5603 Carpal tunnel syndrome, bilateral upper limbs: Secondary | ICD-10-CM

## 2023-11-24 NOTE — Progress Notes (Signed)
 [name] Short - 53 y.o. female MRN [id number]  Date of birth: 1971-03-31  Office Visit Note: Visit Date: 11/24/2023 PCP: Wendolyn Jenkins Jansky, MD Referred by: Wendolyn Jenkins Jansky, MD  Subjective: No chief complaint on file.  HPI: [name] Short is a pleasant 53 y.o. female who presents today for ongoing numbness and tingling in the bilateral hands, right greater than left.  Symptoms have been present now for roughly 1 year, worsening in nature, she states that they may have been present for longer than that.  She has trialed night bracing for nocturnal symptoms without lasting relief.  Has also undergone prior EMG studies of the bilateral upper extremities which has shown severe carpal tunnel syndrome on the right, moderate on the left.  She was evaluated by a previous hand surgical provider who did recommend bilateral carpal tunnel releases in the recent past.  Of note, she also does kickboxing regularly which she feels may be contributing to her ongoing symptoms.  She is here today for second opinion.  Pertinent ROS were reviewed with the patient and found to be negative unless otherwise specified above in HPI.   Visit Reason: Bilateral Carpal Tunnel  Here for second opinion Duration of symptoms:1 year Hand dominance: left Occupation: retired Diabetic: No Smoking: No Heart/Lung History: was born with a heart defect and has been surgically repaired, developed an aneurysm which has been surgically repaired, and has since developed another aneurysm that is currently at 4.5 cm Blood Thinners: aspirin 81 mg daily  Prior Testing/EMG: EMG, xrays Injections (Date): none Treatments: none Prior Surgery: L hand cyst removal in 1981  Assessment & Plan: Visit Diagnoses:  1. Bilateral carpal tunnel syndrome     Plan: Extensive discussion was had with the patient today about her ongoing bilateral carpal tunnel syndrome that is refractory to conservative care.  Patient has both clinical  and electrodiagnostic evidence to confirm this diagnosis.  Her symptoms are more notable on the right side which correlates with electrodiagnostic findings as well of severity.  Given that her symptoms have remained refractory to conservative care in the form of activity modification and prior bracing, she is an appropriate candidate for discussion regarding surgical intervention.  At this juncture, she is indicated for right open versus endoscopic carpal tunnel release.  Risks and benefits of both operations were discussed in detail today.  Understanding all risks and benefits, patient would like to have surgery done in the form of right open carpal tunnel release under local anesthesia.  Particularly given her complex cardiac history, doing surgery under local may be a much safer alternative.  Risks include but not limited to infection, bleeding, scarring, stiffness, nerve injury or vascular, tendon injury, risk of recurrence and need for subsequent operation were all discussed in detail.  Patient consented understanding the above.  She is interested in surgery in the September timeframe, will move forward surgical scheduling.   Follow-up: No follow-ups on file.   Meds & Orders: No orders of the defined types were placed in this encounter.  No orders of the defined types were placed in this encounter.    Procedures: No procedures performed      Clinical History: No specialty comments available.  She reports that she has never smoked. She has never used smokeless tobacco.  Recent Labs    12/12/22 0944  HGBA1C 5.7*    Objective:   Vital Signs: There were no vitals taken for this visit.  Physical Exam  Gen: Well-appearing, in  no acute distress; non-toxic CV: Regular Rate. Well-perfused. Warm.  Resp: Breathing unlabored on room air; no wheezing. Psych: Fluid speech in conversation; appropriate affect; normal thought process  Ortho Exam PHYSICAL EXAM:  General: Patient is well  appearing and in no distress.   Skin and Muscle: No significant skin changes are apparent to upper extremities.   Range of Motion and Palpation Tests: Mobility is full about the elbows with flexion and extension. Forearm supination and pronation are 85/85 bilaterally.  Wrist flexion/extension is 75/65 bilaterally.  Digital flexion and extension are full.  Thumb opposition is full to the base of the small fingers bilaterally.    No cords or nodules are palpated.  No triggering is observed.     Neurologic, Vascular, Motor: Sensation is diminished to light touch in the bilateral median nerve distribution, right greater than left.    Thenar atrophy: Mild right Tinel sign: Positive bilateral carpal tunnel Carpal tunnel compression: Positive bilateral carpal tunnel Phalen test: Positive bilateral  Motor bilateral hand FPL: 5/5 bilateral Index FDP: 5/5 bilateral APB: 4+/5 right, 5/5 left  Fingers pink and well perfused.  Capillary refill is brisk.     Lab Results  Component Value Date   HGBA1C 5.7 (H) 12/12/2022     Imaging: No results found. Prior x-rays from previous hand surgery evaluation at outside facility were reviewed, no significant abnormalities of bilateral wrist films  Past Medical/Family/Surgical/Social History: Medications & Allergies reviewed per EMR, new medications updated. 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   Hx of cardiovascular stress test    ETT-Echo (7/15):  normal   Hx of echocardiogram    Echo (7/15):  Mod LVH, EF 55-60%, no RWMA, + diast dysfunction, AVR ok (mean 16 mmHg), asc aorta 4 cm, trivial MR   Hypothyroidism    Multiple thyroid  nodules    H/o goiter with thyroid  surgery in 8/13   Palpitations 08/21/2020   PONV (postoperative nausea and vomiting)    Family History  Problem Relation Age of Onset   Cancer Mother 82       Gall bladder; found at time gallstone found; smoker   Hypertension Mother    Gallstones Mother    Other Mother        hx of hysterectomy for unspecified reason   Prostate cancer Father        dx early 46s; not very aggressive; s/p seed implants  Skin cancer Father        unspecified type; +sun exposure   Heart Problems Father        same as pt's; s/p surgery   Goiter Sister    Hypothyroidism Sister    Thyroid  disease Sister    Cancer Maternal Grandmother        Gallbladder dx. 58-59; smoker   Heart disease Maternal Grandfather    Heart Problems Maternal Grandfather        heart disease   Heart Problems Paternal Uncle        heart disease, d. 67s; +EtOH abuse   Other Sister        hx of 1 benign breast biopys   Ovarian cancer Other        maternal great aunt (MGM's sister); lim info   Past Surgical History:  Procedure Laterality Date   AORTIC VALVE REPLACEMENT     with replacement of ascending aorta   BIOPSY  11/17/2019   Procedure: BIOPSY;  Surgeon: Kristie Lamprey, MD;  Location: [facility];  Service:  Endoscopy;;   CARDIAC CATHETERIZATION     CARDIAC VALVE REPLACEMENT     CESAREAN SECTION  2011   CHOLECYSTECTOMY N/A 12/20/2015   Procedure: LAPAROSCOPIC CHOLECYSTECTOMY WITH INTRAOPERATIVE CHOLANGIOGRAM;  Surgeon: Debby Shipper, MD;  Location: [facility] OR;  Service: General;  Laterality: N/A;   COLONOSCOPY WITH PROPOFOL  N/A 11/17/2019   Procedure: COLONOSCOPY WITH PROPOFOL ;  Surgeon: Kristie Lamprey, MD;  Location: [facility];  Service: Endoscopy;  Laterality: N/A;   MYOMECTOMY     open heart surgery     THYROID  LOBECTOMY  12/31/2011   Procedure: THYROID  LOBECTOMY;  Surgeon: Ida Loader, MD;  Location: [facility] OR;  Service: ENT;  Laterality: Right;  Substernal Goiter Removal   THYROID  LOBECTOMY  2003   Social History   Occupational History   Not on file  Tobacco Use   Smoking status: Never   Smokeless tobacco: Never   Tobacco comments:    only smoked very infrequently in college  Vaping Use   Vaping status: Never Used  Substance and Sexual Activity   Alcohol use: Yes    Alcohol/week: 5.0 standard drinks of alcohol    Types: 5 Standard drinks or equivalent per week    Comment: 1 glass wine per day   Drug use: No   Sexual activity: Not on file    Jaycen Vercher Estela) Arlinda, M.D. [facility], Hand Surgery

## 2023-12-04 ENCOUNTER — Ambulatory Visit (HOSPITAL_COMMUNITY)
Admission: RE | Admit: 2023-12-04 | Discharge: 2023-12-04 | Disposition: A | Discharge status: Home / Self Care | Source: Ambulatory Visit | Attending: Cardiology | Admitting: Cardiology

## 2023-12-04 DIAGNOSIS — I7121 Aneurysm of the ascending aorta, without rupture: Secondary | ICD-10-CM | POA: Diagnosis present

## 2023-12-04 DIAGNOSIS — Z952 Presence of prosthetic heart valve: Secondary | ICD-10-CM | POA: Insufficient documentation

## 2023-12-04 LAB — ECHOCARDIOGRAM COMPLETE
AR max vel: 1.26 cm2
AV Area VTI: 1.55 cm2
AV Area mean vel: 1.41 cm2
AV Mean grad: 22.8 mmHg
AV Peak grad: 49.3 mmHg
Ao pk vel: 3.51 m/s
Area-P 1/2: 3.6 cm2
S' Lateral: 2.8 cm

## 2023-12-07 ENCOUNTER — Other Ambulatory Visit (HOSPITAL_BASED_OUTPATIENT_CLINIC_OR_DEPARTMENT_OTHER): Payer: No Typology Code available for payment source

## 2023-12-12 ENCOUNTER — Ambulatory Visit: Payer: Self-pay | Admitting: Cardiovascular Disease

## 2023-12-18 ENCOUNTER — Encounter (HOSPITAL_BASED_OUTPATIENT_CLINIC_OR_DEPARTMENT_OTHER): Payer: Self-pay

## 2023-12-22 ENCOUNTER — Telehealth (HOSPITAL_BASED_OUTPATIENT_CLINIC_OR_DEPARTMENT_OTHER): Payer: Self-pay | Admitting: Cardiovascular Disease

## 2023-12-22 NOTE — Telephone Encounter (Signed)
 Per Dr. Jeralyn note:  Radiology measured this area at 4.8 cm on the most recent CTA of the chest but there is significant motion artifact.  I measure this area at 4.6 cm.  I went back and looked at the CTA prior to her surgery in 2008 and I measured this area of the aorta at 4.1 cm.  I think there has been gradual enlargement over the years but I think the current measurement is about 4.6 cm.  This is still below the usual surgical threshold of around 5 cm.  I reviewed the CT images with her and answered all of her questions.  I recommended that she have a follow-up CTA of the chest in 1 year.   Discussed with Reche Finder, NP ann will defer to Dr. Lucas and repeat in one year.  Spoke with patient and she is aware will cancel test scheduled in September.

## 2023-12-22 NOTE — Telephone Encounter (Signed)
 Pt tried to schedule appt with Dr Raford. Nothing available until Dec. I have scheduled her for Dec. And added to the wait list. I do see appts available for [facility] next Monday. Will it be ok for her to see a NP? She is concerned with the echo and future CT. Please call pt and advise.

## 2023-12-22 NOTE — Telephone Encounter (Signed)
 Called pt to inform her of note. She advised that her surgeon (cardio) Dr Lucas had let her know that her aorta had increased much and didn't see a concern. She was told by this provider that a CT every year would do. So wants to know if the CT is really a necessity, She doesn't want too much radiation from the CT's.

## 2023-12-22 NOTE — Telephone Encounter (Signed)
 Last seen 11/2022 recommended for 1 year follow up. Okay to see APP sooner if she wishes. Could schedule with APP for visit this month and leave appt with Dr. Raford in December as scheduled.  CT 07/2023 with aorta slightly increased. She has repeat CT in September, keep as scheduled.  Echo with only mild changes that are not of concern.   Katurah Karapetian S Serigne Kubicek, NP

## 2024-01-05 ENCOUNTER — Ambulatory Visit: Payer: Self-pay | Admitting: Family Medicine

## 2024-01-05 ENCOUNTER — Encounter: Payer: Self-pay | Admitting: Family Medicine

## 2024-01-05 ENCOUNTER — Ambulatory Visit (INDEPENDENT_AMBULATORY_CARE_PROVIDER_SITE_OTHER): Admitting: Family Medicine

## 2024-01-05 VITALS — BP 110/79 | HR 89 | Temp 97.8°F | Resp 16 | Ht 66.0 in | Wt 194.1 lb

## 2024-01-05 DIAGNOSIS — Z0000 Encounter for general adult medical examination without abnormal findings: Secondary | ICD-10-CM | POA: Diagnosis not present

## 2024-01-05 DIAGNOSIS — D2321 Other benign neoplasm of skin of right ear and external auricular canal: Secondary | ICD-10-CM | POA: Diagnosis not present

## 2024-01-05 DIAGNOSIS — Z23 Encounter for immunization: Secondary | ICD-10-CM

## 2024-01-05 LAB — LIPID PANEL
Cholesterol: 262 mg/dL — ABNORMAL HIGH (ref 0–200)
HDL: 53.2 mg/dL (ref >39.00)
LDL Cholesterol: 165 mg/dL — ABNORMAL HIGH (ref 0–99)
NonHDL: 208.51
Total CHOL/HDL Ratio: 5
Triglycerides: 218 mg/dL — ABNORMAL HIGH (ref 0.0–149.0)
VLDL: 43.6 mg/dL — ABNORMAL HIGH (ref 0.0–40.0)

## 2024-01-05 LAB — CBC WITH DIFFERENTIAL/PLATELET
Basophils Absolute: 0.1 K/uL (ref 0.0–0.1)
Basophils Relative: 1.2 % (ref 0.0–3.0)
Eosinophils Absolute: 0.2 K/uL (ref 0.0–0.7)
Eosinophils Relative: 3.3 % (ref 0.0–5.0)
HCT: 40.9 % (ref 36.0–46.0)
Hemoglobin: 13.8 g/dL (ref 12.0–15.0)
Lymphocytes Relative: 24.1 % (ref 12.0–46.0)
Lymphs Abs: 1.3 K/uL (ref 0.7–4.0)
MCHC: 33.7 g/dL (ref 30.0–36.0)
MCV: 91.3 fl (ref 78.0–100.0)
Monocytes Absolute: 0.3 K/uL (ref 0.1–1.0)
Monocytes Relative: 6.5 % (ref 3.0–12.0)
Neutro Abs: 3.4 K/uL (ref 1.4–7.7)
Neutrophils Relative %: 64.9 % (ref 43.0–77.0)
Platelets: 182 K/uL (ref 150.0–400.0)
RBC: 4.48 Mil/uL (ref 3.87–5.11)
RDW: 12.5 % (ref 11.5–15.5)
WBC: 5.2 K/uL (ref 4.0–10.5)

## 2024-01-05 LAB — COMPREHENSIVE METABOLIC PANEL WITH GFR
ALT: 12 U/L (ref 0–35)
AST: 17 U/L (ref 0–37)
Albumin: 4.4 g/dL (ref 3.5–5.2)
Alkaline Phosphatase: 68 U/L (ref 39–117)
BUN: 18 mg/dL (ref 6–23)
CO2: 29 meq/L (ref 19–32)
Calcium: 9 mg/dL (ref 8.4–10.5)
Chloride: 103 meq/L (ref 96–112)
Creatinine, Ser: 0.83 mg/dL (ref 0.40–1.20)
GFR: 79.77 mL/min (ref >60.00)
Glucose, Bld: 88 mg/dL (ref 70–99)
Potassium: 4.1 meq/L (ref 3.5–5.1)
Sodium: 138 meq/L (ref 135–145)
Total Bilirubin: 0.5 mg/dL (ref 0.2–1.2)
Total Protein: 6.8 g/dL (ref 6.0–8.3)

## 2024-01-05 LAB — HEMOGLOBIN A1C: Hgb A1c MFr Bld: 5.6 % (ref 4.6–6.5)

## 2024-01-05 LAB — TSH: TSH: 1.14 u[IU]/mL (ref 0.35–5.50)

## 2024-01-05 MED ORDER — MELOXICAM 15 MG PO TABS: 15.0000 mg | ORAL_TABLET | Freq: Every day | ORAL | 1 refills | Status: AC | PRN

## 2024-01-05 NOTE — Patient Instructions (Addendum)
 It was very nice to see you today!  Get date of shingrix  #2.    Log ha   PLEASE NOTE:  If you had any lab tests please let us  know if you have not heard back within a few days. You may see your results on MyChart before we have a chance to review them but we will give you a call once they are reviewed by us . If we ordered any referrals today, please let us  know if you have not heard from their office within the next week.   Please try these tips to maintain a healthy lifestyle:  Eat most of your calories during the day when you are active. Eliminate processed foods including packaged sweets (pies, cakes, cookies), reduce intake of potatoes, white bread, white pasta, and white rice. Look for whole grain options, oat flour or almond flour.  Each meal should contain half fruits/vegetables, one quarter protein, and one quarter carbs (no bigger than a computer mouse).  Cut down on sweet beverages. This includes juice, soda, and sweet tea. Also watch fruit intake, though this is a healthier sweet option, it still contains natural sugar! Limit to 3 servings daily.  Drink at least 1 glass of water with each meal and aim for at least 8 glasses per day  Exercise at least 150 minutes every week.

## 2024-01-05 NOTE — Progress Notes (Signed)
 Labs ok except cholesterol.  Keep working on it

## 2024-01-05 NOTE — Progress Notes (Signed)
 Phone [phone]   Subjective:   Patient is a 53 y.o. female presenting for annual physical.    Chief Complaint  Patient presents with   Annual Exam    CPE Questions about weight gain, excessive sweating issues and possible menopause Has a cyst behind ear that has gotten bigger   Annual.  Pap oct.  Mamm oct-Dr. Mat.  Exercises Discussed the use of AI scribe software for clinical note transcription with the patient, who gave verbal consent to proceed.  History of Present Illness [name] Short is a 53 year old female who presents for an annual physical exam.  She has a congenital heart defect that required previous surgical intervention. Her heart valve is functioning well but is starting to show calcification. The aorta, where a portion was previously replaced, is ballooning slightly and is being monitored closely. Recent measurements indicate it is at 4.5 cm. She undergoes annual CT scans and echocardiograms to monitor the condition. Seeing Card and CVTS.   No chest pain, shortness of breath, or palpitations.  She experiences occasional headaches, which she attributes to neck tension. She takes Tylenol  for relief, as she cannot take ibuprofen  due to her use of meloxicam . She reports occasional headaches, which are not severe, and a recent allergic reaction to shrimp. She has not had runny nose, congestion, or sore throat recently.  She has a history of arthritis affecting her knees, feet, and right hip. She takes meloxicam  15 mg daily, which helps manage her symptoms. She has received cortisone shots for her hip, particularly before travel, which have provided relief. She also has a torn meniscus in her left knee, which she manages by avoiding certain exercises. No swelling in the legs.  She reports a recent allergic reaction to shrimp, characterized by itchy hands and hives on her neck and chest. She took Benadryl, which resolved the symptoms, and has since avoided shrimp. She  has tolerated other shellfish without issues.  She is postmenopausal and experiences hot flashes and excessive sweating, which have worsened since discontinuing Slynd. She reports significant sweating during a recent trip to [place], which was exacerbated by the lack of air conditioning in public spaces.  She experiences constipation, particularly when traveling, and manages it with Colace and Miralax. Her bowel movements are less frequent and not as complete as usual during these times. No vomiting, diarrhea, or heartburn.  She is concerned about recent weight gain, which she feels is rapid and concentrated around her stomach and back. She attributes some of this to menopause and is trying to manage her diet and exercise routine accordingly.    See problem oriented charting- ROS- ROS: Gen: no fever, chills  Skin: no rash, itching ENT: no ear pain, ear drainage, nasal congestion, rhinorrhea, sinus pressure, sore throat.  Occ allergies Eyes: no blurry vision, double vision Resp: no cough, wheeze,SOB CV: no CP, palpitations, LE edema,  GI: no heartburn, n/v/d, abd pain.  Occ constipation GU: no dysuria, urgency, frequency, hematuria MSK: +joint pains.  Meloxicam  daily.  Torn meniscus L knee.  [facility]: no dizziness, weakness, vertigo.  Occ ha Psych: no depression, anxiety, insomnia, SI   The following were reviewed and entered/updated in epic: Past Medical History:  Diagnosis Date   Breast feeding status of mother    Complication of anesthesia    Congenital heart defect    bicuspid aortic valve, aortic insufficeincy, ascending aortic aneurysm => s/p AVR (bioprosthetic) and ascending aortic aneurysm repair in 2008   Family history  of adverse reaction to anesthesia    Dad who also had CAD (valve disease) coded with anesthesia    Heart murmur    History of uterine fibroid    Hx of cardiac cath    [facility] (2008, pre-surgery) with no coronary artery disease, EF 60% on LV-gram.    Hx of cardiovascular stress test    ETT-Echo (7/15):  normal   Hx of echocardiogram    Echo (7/15):  Mod LVH, EF 55-60%, no RWMA, + diast dysfunction, AVR ok (mean 16 mmHg), asc aorta 4 cm, trivial MR   Hypothyroidism    Multiple thyroid  nodules    H/o goiter with thyroid  surgery in 8/13   Palpitations 08/21/2020   PONV (postoperative nausea and vomiting)    Patient Active Problem List   Diagnosis Date Noted   Palpitations 08/21/2020   Acute otalgia, left 04/23/2018   Acute otitis externa of left ear 04/23/2018   History of bicuspid aortic valve 03/13/2017   Bilateral hearing loss due to cerumen impaction 03/03/2017   Family history of cancer of gallbladder 12/12/2015   S/P AVR 06/22/2012   Ascending aortic aneurysm (HCC) 06/22/2012   Aneurysm of aortic root 01/06/2012   Nontoxic multinodular goiter 01/06/2012   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   Past Surgical History:  Procedure Laterality Date   AORTIC VALVE REPLACEMENT     with replacement of ascending aorta   BIOPSY  11/17/2019   Procedure: BIOPSY;  Surgeon: Kristie Lamprey, MD;  Location: [facility];  Service: Endoscopy;;   CARDIAC CATHETERIZATION     CARDIAC VALVE REPLACEMENT     CESAREAN SECTION  2011   CHOLECYSTECTOMY N/A 12/20/2015   Procedure: LAPAROSCOPIC CHOLECYSTECTOMY WITH INTRAOPERATIVE CHOLANGIOGRAM;  Surgeon: Debby Shipper, MD;  Location: [facility] OR;  Service: General;  Laterality: N/A;   COLONOSCOPY WITH PROPOFOL  N/A 11/17/2019   Procedure: COLONOSCOPY WITH PROPOFOL ;  Surgeon: Kristie Lamprey, MD;  Location: [facility];  Service: Endoscopy;  Laterality: N/A;   MYOMECTOMY     open heart surgery     THYROID  LOBECTOMY  12/31/2011   Procedure: THYROID  LOBECTOMY;  Surgeon: Ida Loader, MD;  Location: [facility] OR;  Service: ENT;  Laterality: Right;  Substernal Goiter Removal   THYROID  LOBECTOMY  2003    Family History  Problem Relation Age of Onset   Cancer Mother 56       Gall bladder; found at time gallstone found; smoker    Hypertension Mother    Gallstones Mother    Other Mother        hx of hysterectomy for unspecified reason   Prostate cancer Father        dx early 54s; not very aggressive; s/p seed implants   Skin cancer Father        unspecified type; +sun exposure   Heart Problems Father        same as pt's; s/p surgery   Goiter Sister    Hypothyroidism Sister    Thyroid  disease Sister    Cancer Maternal Grandmother        Gallbladder dx. 58-59; smoker   Heart disease Maternal Grandfather    Heart Problems Maternal Grandfather        heart disease   Heart Problems Paternal Uncle        heart disease, d. 2s; +EtOH abuse   Other Sister        hx of 1 benign breast biopys   Ovarian cancer Other        maternal great  aunt (MGM's sister); lim info    Medications- reviewed and updated Current Outpatient Medications  Medication Sig Dispense Refill   aspirin EC 81 MG tablet Take 1 tablet (81 mg total) by mouth daily.     calcium carbonate (OSCAL) 1500 (600 Ca) MG TABS tablet Take by mouth daily with breakfast.     clindamycin  (CLEOCIN ) 300 MG capsule TAKE 2 CAPSULES BY MOUTH PRIOR TO ALL DENTAL APPOINTMENTS     gabapentin  (NEURONTIN ) 300 MG capsule Take 300 mg by mouth 3 (three) times daily. (Patient taking differently: Take 300 mg by mouth as needed.)     VITAMIN D, CHOLECALCIFEROL, PO Take by mouth.     meloxicam  (MOBIC ) 15 MG tablet Take 1 tablet (15 mg total) by mouth daily as needed. 90 tablet 1   No current facility-administered medications for this visit.    Allergies-reviewed and updated Allergies  Allergen Reactions   Quinolones     Increased risk of aortic dissection   Amoxicillin Itching    Other reaction(s): Unknown   Mupirocin Itching, Swelling and Other (See Comments)    Numbness of upper lip    Shrimp (Diagnostic) Hives and Itching    Social History   Social History Narrative   Retired Runner, broadcasting/film/video   Objective  Objective:  BP 110/79   Pulse 89   Temp 97.8 F (36.6  C) (Temporal)   Resp 16   Ht 5' 6 (1.676 m)   Wt 194 lb 2 oz (88.1 kg)   LMP 12/07/2019 (Within Months)   SpO2 98%   BMI 31.33 kg/m  Physical Exam  Gen: [facility] NAD HEENT: NCAT, conjunctiva not injected, sclera nonicteric TM WNL R, wax deep on L, OP moist, no exudates approx 2cm cyst behind R ear NECK:  supple, no thyromegaly, no nodes, no carotid bruits CARDIAC: RRR, S1S2+, +3/6 murmur. DP 2+B LUNGS: CTAB. No wheezes ABDOMEN:  BS+, soft, NTND, No HSM, no masses EXT:  no edema MSK: no gross abnormalities. MS 5/5 all 4 NEURO: A&O x3.  CN II-XII intact.  PSYCH: normal mood. Good eye contact     Assessment and Plan   Health Maintenance counseling: 1. Anticipatory guidance: Patient counseled regarding regular dental exams q6 months, eye exams,  avoiding smoking and second hand smoke, limiting alcohol to 1 beverage per day, no illicit drugs.   2. Risk factor reduction:  Advised patient of need for regular exercise and diet rich and fruits and vegetables to reduce risk of heart attack and stroke. Exercise- +.  Wt Readings from Last 3 Encounters:  01/05/24 194 lb 2 oz (88.1 kg)  10/07/23 196 lb (88.9 kg)  12/12/22 190 lb 8 oz (86.4 kg)   3. Immunizations/screenings/ancillary studies Immunization History  Administered Date(s) Administered   Influenza,inj,Quad PF,6+ Mos 04/13/2017   Influenza-Unspecified 02/18/2021   PFIZER(Purple Top)SARS-COV-2 Vaccination 07/31/2019, 08/20/2019, 03/01/2020, 10/29/2020   PNEUMOCOCCAL CONJUGATE-20 01/05/2024   Pfizer Covid-19 Vaccine Bivalent Booster 86yrs & up 02/18/2021   Tdap 04/08/2014, 01/05/2024   Unspecified SARS-COV-2 Vaccination 02/18/2021   Zoster Recombinant(Shingrix ) 12/08/2022   Health Maintenance Due  Topic Date Due   HIV Screening  Never done   Hepatitis C Screening  Never done   Hepatitis B Vaccines 19-59 Average Risk (1 of 3 - 19+ 3-dose series) Never done   Cervical Cancer Screening (HPV/Pap Cotest)  06/23/2017   MAMMOGRAM   02/02/2023   INFLUENZA VACCINE  12/18/2023    4. Cervical cancer screening- gyn 5. Breast cancer screening-  mammogram gyn 6.  Colon cancer screening - utd 7. Skin cancer screening- advised regular sunscreen use. Denies worrisome, changing, or new skin lesions.  8. Birth control/STD check- n/a 9. Osteoporosis screening- n/a 10. Smoking associated screening - non smoker  Wellness examination -     CBC with Differential/Platelet -     Comprehensive metabolic panel with GFR -     Lipid panel -     TSH -     Hemoglobin A1c  Need for pneumococcal vaccination -     Pneumococcal conjugate vaccine 20-valent  Need for tetanus booster -     Tdap vaccine greater than or equal to 7yo IM  Dermoid cyst of ear, right -     Ambulatory referral to ENT  Other orders -     Meloxicam ; Take 1 tablet (15 mg total) by mouth daily as needed.  Dispense: 90 tablet; Refill: 1   Wellness-anticipatory guidance.  Work on Diet/Exercise  Check CBC,CMP,lipids,TSH, A1C.  F/u 1 yr  Tdap and pneumo 20 given Assessment and Plan Assessment & Plan Adult Wellness Visit   During her routine adult wellness visit, there were no new surgeries or significant changes in family history. Surveillance of her congenital heart defect continues. She maintains regular exercise despite gym closures and reports weight gain likely due to menopause. Alcohol consumption remains unchanged. She experiences no major headaches, migraines, respiratory, or gastrointestinal symptoms. Postmenopausal status is confirmed with no depression or suicidal ideation. Administer tetanus and pneumonia vaccines, recommend an annual flu vaccine in September or October, and discuss COVID-19 vaccination. Encourage continued exercise and a healthy diet. Perform blood work.  Surveillance of congenital heart defect with aortic root dilation and bioprosthetic valve   Her congenital heart defect with aortic root dilation at 4.5 cm and bioprosthetic valve remains  stable per recent surgical consultation. No immediate surgical intervention is needed. Continue annual CT scan and echocardiogram for monitoring.  Osteoarthritis of right hip, knees, and feet with right hip trochanteric bursitis and left knee meniscus tear   She has chronic osteoarthritis in her right hip, knees, and feet, with right hip trochanteric bursitis and a left knee meniscus tear. Meloxicam  provides significant relief. She occasionally receives cortisone injections for right hip pain, especially before travel. Gabapentin  is used for sleep and pain management. Avoid high-impact activities to prevent exacerbation. Continue meloxicam  15 mg daily, consider gabapentin  as needed, and continue cortisone injections as needed.  Constipation, recurrent   Recurrent constipation is exacerbated by travel and managed with Colace and Miralax. No significant issues with diarrhea or other gastrointestinal symptoms. Continue Colace and Miralax as needed.  Menopause with vasomotor symptoms (hot flashes, sweating) and weight gain   Menopausal symptoms include hot flashes, excessive sweating, and weight gain, worsened during recent travel. Discuss potential hormone replacement therapy for symptom management. Weight gain is likely related to menopause; advise reducing alcohol and carbohydrate intake.  Allergy to shrimp (shellfish) with prior anaphylactic reaction   She has an allergy to shrimp with prior anaphylactic symptoms including hives and itching, but no respiratory involvement. Avoid shrimp and shellfish and carry Benadryl for emergency use.  Hypercholesterolemia, under surveillance   Her hypercholesterolemia is under surveillance by a cardiologist with no current medication prescribed. Continue surveillance of cholesterol levels and maintain a healthy diet and lifestyle.  Tension-type headaches, mild and infrequent   She experiences mild and infrequent tension-type headaches, possibly related to neck  tension. These are managed with Tylenol  and neck stretching exercises, with no significant impact on daily activities.  Continue Tylenol  as needed, perform neck stretching exercises, and track headache frequency and triggers.    Recommended follow up: Return in about 1 year (around 01/04/2025) for annual physical.  Lab/Order associations:not fasting  Jenkins CHRISTELLA Carrel, MD

## 2024-01-21 IMAGING — MR MR MRA CHEST W/ OR W/O CM
15 series · 16 of 16 positions shown · IV contrast (10 ML GADAVIST)
Comparison: 08/30/2019

CLINICAL DATA: Follow-up of aneurysmal disease of the ascending
thoracic aorta.

EXAM:
MRA CHEST WITH OR WITHOUT CONTRAST
TECHNIQUE: Angiographic images of the chest were obtained using MRA technique
with intravenous contrast.
CONTRAST:  10mL GADAVIST GADOBUTROL 1 MMOL/ML IV SOLN

[Series 2: cor_trufi_fb · coronal · 4.5mm · 0.74mm/px · 2 of 128 slices shown]
[im 1/128]
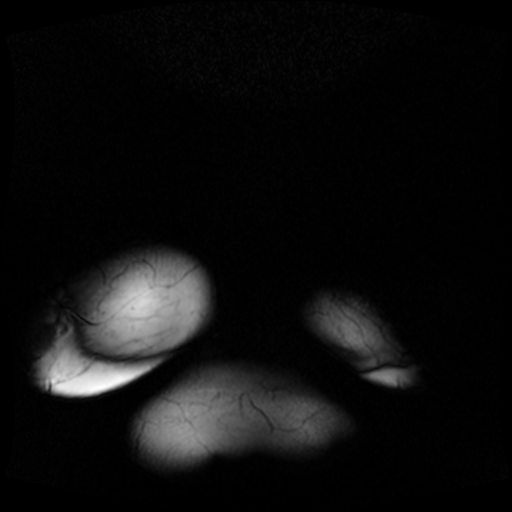
[im 128/128]
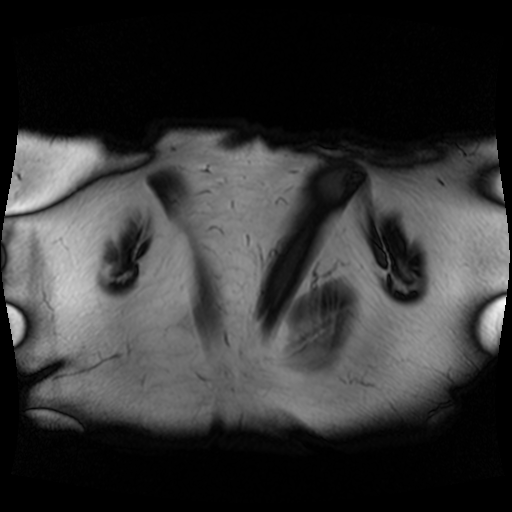

[Series 3: cor haste · coronal · 8.0mm · 1.25mm/px · 1 of 26 slices shown]
[im 1/26]
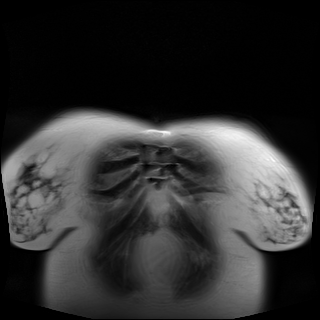

[Series 4: ax_trufi_trig · axial · 8.0mm · 1.33mm/px · 1 of 30 slices shown]
[im 1/30]
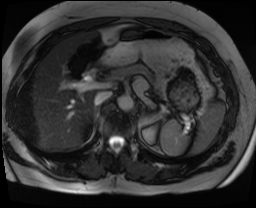

[Series 5: ax _haste_trig · axial · 9.0mm · 1.48mm/px · 1 of 24 slices shown]
[im 1/24]
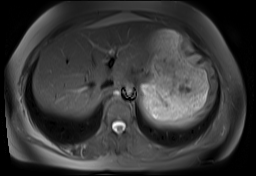

[Series 6: T1 dynamic · axial · 3.0mm · 1.25mm/px · 1 of 96 slices shown (1 of 7)]
[im 1/96]
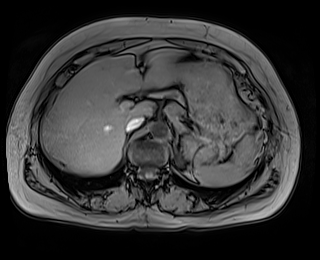

[Series 7: T1 dynamic · axial · 3.0mm · 1.25mm/px · 1 of 96 slices shown (2 of 7)]
[im 1/96]
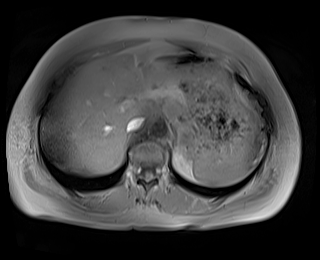

[Series 9: T1 dynamic · axial · 3.0mm · 1.25mm/px · 1 of 96 slices shown (3 of 7)]
[im 1/96]
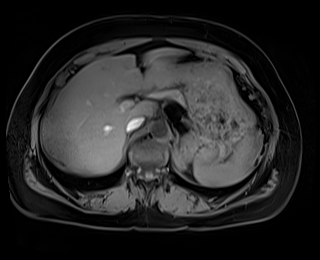

[Series 10: angio_fl3d_parasag_p3_pre · sagittal · 1.2mm · 0.99mm/px · 1 of 104 slices shown]
[im 1/104]
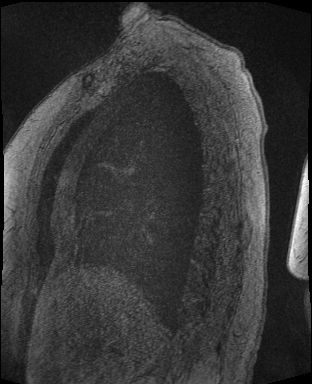

[Series 12: angio_fl3d_parasag_p3_post · sagittal · 1.2mm · 0.99mm/px · 1 of 104 slices shown]
[im 1/104]
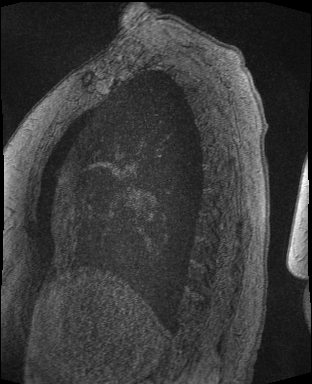

[Series 13: angio_fl3d_parasag_p3_post_sub · sagittal · 1.2mm · 0.99mm/px · 1 of 104 slices shown]
[im 1/104]
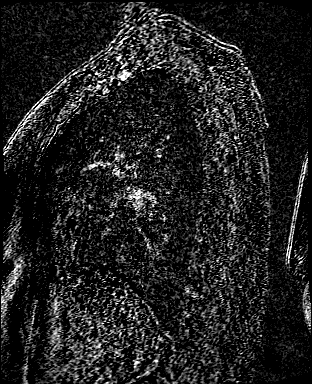

[Series 15: T1 dynamic · axial · 3.0mm · 1.25mm/px · 1 of 96 slices shown (4 of 7)]
[im 1/96]
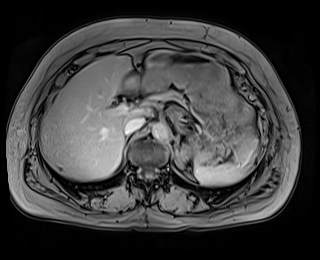

[Series 16: T1 dynamic · axial · 3.0mm · 1.25mm/px · 1 of 96 slices shown (5 of 7)]
[im 1/96]
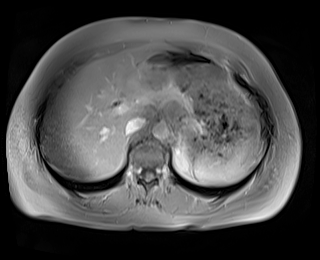

[Series 18: T1 dynamic · axial · 3.0mm · 1.25mm/px · 1 of 96 slices shown (6 of 7)]
[im 1/96]
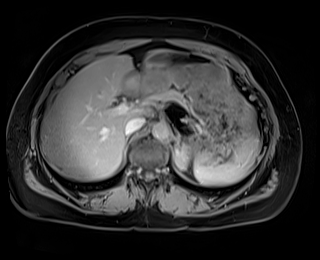

[Series 20: T1 dynamic · coronal · 3.0mm · 1.41mm/px · 1 of 72 slices shown (7 of 7)]
[im 1/72]
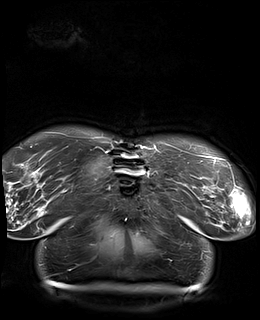

[Series 100: MRA · axial · 0.79mm/px · 1 of 14 slices shown]
[im 1/14]
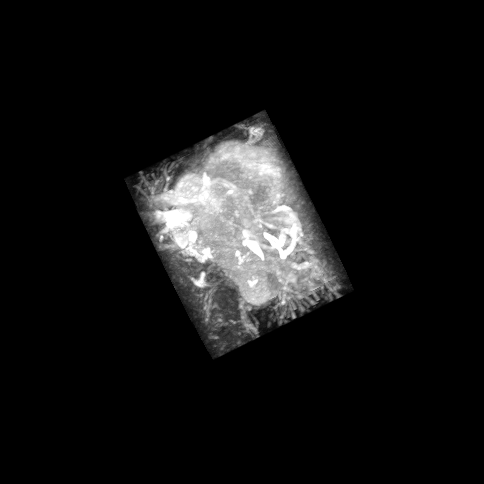

[16 of 16 positions shown; findings below may reference images not displayed]

FINDINGS: VASCULAR

Aorta: Status post aortic valve replacement. Stable aneurysmal
disease of the ascending thoracic aorta measuring approximately
cm in maximum diameter. The proximal arch measures 3.6 cm and the
distal arch 2.5 cm. The descending thoracic aorta measures 2.5 cm.
No evidence of aortic dissection. Visualized proximal great vessels
demonstrate normal patency and branching anatomy.

Heart: The heart size is normal.  No pericardial fluid identified.

Pulmonary Arteries:  The pulmonary arteries are normal in caliber.

NON-VASCULAR

Visualized lungs demonstrate no nodules, consolidation or pleural
fluid. No lymphadenopathy identified. Visualized upper abdomen and
bony structures are unremarkable.
IMPRESSION: Stable aneurysmal disease of the ascending thoracic aorta measuring
approximately 4.3 cm in maximum diameter.

Recommend annual imaging followup by CTA or MRA. This recommendation
follows 2252 ACCF/AHA/AATS/ACR/ASA/SCA/LYMAN/NISHIMURA/CHAMA/BUKHARI Guidelines
for the Diagnosis and Management of Patients with Thoracic Aortic
Disease. Circulation. 2252; 121: [id number]. Aortic aneurysm NOS
([id number].M)

## 2024-02-01 ENCOUNTER — Other Ambulatory Visit (HOSPITAL_COMMUNITY)

## 2024-02-11 ENCOUNTER — Encounter: Admitting: Family Medicine

## 2024-02-25 ENCOUNTER — Ambulatory Visit (INDEPENDENT_AMBULATORY_CARE_PROVIDER_SITE_OTHER)

## 2024-02-25 ENCOUNTER — Encounter (INDEPENDENT_AMBULATORY_CARE_PROVIDER_SITE_OTHER): Payer: Self-pay

## 2024-02-25 VITALS — BP 94/68 | HR 83 | Temp 97.8°F | Ht 66.0 in | Wt 194.0 lb

## 2024-02-25 DIAGNOSIS — Q181 Preauricular sinus and cyst: Secondary | ICD-10-CM | POA: Diagnosis not present

## 2024-02-25 DIAGNOSIS — G4710 Hypersomnia, unspecified: Secondary | ICD-10-CM

## 2024-02-25 DIAGNOSIS — G4733 Obstructive sleep apnea (adult) (pediatric): Secondary | ICD-10-CM

## 2024-02-25 NOTE — Progress Notes (Signed)
 Dear Dr. Wendolyn, Here is my assessment for our mutual patient, [name] Short. Thank you for allowing me the opportunity to care for your patient. Please do not hesitate to contact me should you have any other questions. Sincerely, Dr. Penne Croak  Otolaryngology Clinic Note Referring provider: Dr. Wendolyn HPI:  Discussed the use of AI scribe software for clinical note transcription with the patient, who gave verbal consent to proceed.  History of Present Illness [name] Short is a 53 year old female who presents with a large cyst near her ear for evaluation. She was referred by her dermatologist and regular doctor to an ENT for evaluation of the cyst.  Preauricular cyst - Large cyst located near the ear, present for up to ten years - Recent increase in size prompted evaluation - Cyst is painless - History of similar cysts, including one on the neck and another on the wrist removed during childhood - Desires to avoid unnecessary procedures unless the cyst is concerning  Snoring and sleep disturbance - Recent onset of snoring that wakes her from sleep - Snoring occurs without her mouth being open - Attributes change to recent weight gain - Concerned about possible sleep apnea  Cardiac history and imaging concerns - History of heart valve replacement due to congenital defect - History of repaired aortic aneurysm - Cardiologist monitors aortic dilation and valve function closely - Cautious about undergoing additional CT scans due to frequent prior imaging and cardiac history  The STOP-BANG questionnaire items used to screen for obstructive sleep apnea are: 1. Snoring: Do you snore loudly (loud enough to be heard through closed doors)? Yes  2. Tiredness: Do you often feel tired, fatigued, or sleepy during the daytime? No  3. Observed apnea: Has anyone observed you stop breathing during your sleep? Yes  4. Blood pressure: Do you have or are you being treated for high blood  pressure? No  5. Body mass index (BMI): Is your BMI greater than 35 kg/m? No  6. Age: Are you older than 50 years? Yes  7. Neck circumference: Is your neck circumference greater than 40 cm? No   8. Gender: Are you female? No  Each item is answered as yes or no, and the total score (0-8) is used to stratify risk for obstructive sleep apnea. This tool is widely validated and recommended for screening in clinical and perioperative settings, as supported by the American Heart Association and multiple large studies.  The Epworth Sleepiness Scale questionnaire consists of eight questions that ask about the likelihood of dozing off or falling asleep in the following situations: 1. Sitting and reading 3  2. Watching TV 3  3. Sitting inactive in a public place (e.g., a theater or meeting) 0  4. As a passenger in a car for an hour without a break 3  5. Lying down to rest in the afternoon when circumstances permit 3  6. Sitting and talking to someone 0  7. Sitting quietly after a lunch without alcohol 1  8. In a car, while stopped for a few minutes in traffic 0  Each situation is rated on a scale from 0 (would never doze) to 3 (high chance of dozing), and the total score ranges from 0 to 24, with higher scores indicating greater daytime sleepiness.[1  Independent Review of Additional Tests or Records:  Reviewed external note from referring PCP, Kulik,describing  Chief Complaint  Patient presents with   Cyst    Cyst behind rt ear  . Relevant  history incorporated into today's evaluation. I personally reviewed and interpreted none  PMH/Meds/All/SocHx/FamHx/ROS:   Past Medical History:  Diagnosis Date   Breast feeding status of mother    Complication of anesthesia    Congenital heart defect    bicuspid aortic valve, aortic insufficeincy, ascending aortic aneurysm => s/p AVR (bioprosthetic) and ascending aortic aneurysm repair in 2008   Family history of adverse reaction to  anesthesia    Dad who also had CAD (valve disease) coded with anesthesia    Heart murmur    History of uterine fibroid    Hx of cardiac cath    [facility] (2008, pre-surgery) with no coronary artery disease, EF 60% on LV-gram.   Hx of cardiovascular stress test    ETT-Echo (7/15):  normal   Hx of echocardiogram    Echo (7/15):  Mod LVH, EF 55-60%, no RWMA, + diast dysfunction, AVR ok (mean 16 mmHg), asc aorta 4 cm, trivial MR   Hypothyroidism    Multiple thyroid  nodules    H/o goiter with thyroid  surgery in 8/13   Palpitations 08/21/2020   PONV (postoperative nausea and vomiting)      Past Surgical History:  Procedure Laterality Date   AORTIC VALVE REPLACEMENT     with replacement of ascending aorta   BIOPSY  11/17/2019   Procedure: BIOPSY;  Surgeon: Kristie Lamprey, MD;  Location: [facility];  Service: Endoscopy;;   CARDIAC CATHETERIZATION     CARDIAC VALVE REPLACEMENT     CESAREAN SECTION  2011   CHOLECYSTECTOMY N/A 12/20/2015   Procedure: LAPAROSCOPIC CHOLECYSTECTOMY WITH INTRAOPERATIVE CHOLANGIOGRAM;  Surgeon: Debby Shipper, MD;  Location: [facility] OR;  Service: General;  Laterality: N/A;   COLONOSCOPY WITH PROPOFOL  N/A 11/17/2019   Procedure: COLONOSCOPY WITH PROPOFOL ;  Surgeon: Kristie Lamprey, MD;  Location: [facility];  Service: Endoscopy;  Laterality: N/A;   MYOMECTOMY     open heart surgery     THYROID  LOBECTOMY  12/31/2011   Procedure: THYROID  LOBECTOMY;  Surgeon: Ida Loader, MD;  Location: [facility] OR;  Service: ENT;  Laterality: Right;  Substernal Goiter Removal   THYROID  LOBECTOMY  2003    Family History  Problem Relation Age of Onset   Cancer Mother 81       Gall bladder; found at time gallstone found; smoker   Hypertension Mother    Gallstones Mother    Other Mother        hx of hysterectomy for unspecified reason   Prostate cancer Father        dx early 57s; not very aggressive; s/p seed implants   Skin cancer Father        unspecified type; +sun exposure   Heart Problems  Father        same as pt's; s/p surgery   Goiter Sister    Hypothyroidism Sister    Thyroid  disease Sister    Cancer Maternal Grandmother        Gallbladder dx. 58-59; smoker   Heart disease Maternal Grandfather    Heart Problems Maternal Grandfather        heart disease   Heart Problems Paternal Uncle        heart disease, d. 50s; +EtOH abuse   Other Sister        hx of 1 benign breast biopys   Ovarian cancer Other        maternal great aunt (MGM's sister); lim info     Social Connections: Unknown (11/01/2022)   Social Connection and Isolation Panel  Frequency of Communication with Friends and Family: Patient declined    Frequency of Social Gatherings with Friends and Family: Once a week    Attends Religious Services: Never    Database administrator or Organizations: No    Attends Engineer, structural: Not on file    Marital Status: Married      Current Outpatient Medications:    aspirin EC 81 MG tablet, Take 1 tablet (81 mg total) by mouth daily., Disp: , Rfl:    calcium carbonate (OSCAL) 1500 (600 Ca) MG TABS tablet, Take by mouth daily with breakfast., Disp: , Rfl:    clindamycin  (CLEOCIN ) 300 MG capsule, TAKE 2 CAPSULES BY MOUTH PRIOR TO ALL DENTAL APPOINTMENTS, Disp: , Rfl:    gabapentin  (NEURONTIN ) 300 MG capsule, Take 300 mg by mouth 3 (three) times daily. (Patient taking differently: Take 300 mg by mouth as needed.), Disp: , Rfl:    meloxicam  (MOBIC ) 15 MG tablet, Take 1 tablet (15 mg total) by mouth daily as needed., Disp: 90 tablet, Rfl: 1   VITAMIN D, CHOLECALCIFEROL, PO, Take by mouth., Disp: , Rfl:    Physical Exam:   BP 94/68 (BP Location: Right Arm, Patient Position: Sitting, Cuff Size: Normal)   Pulse 83   Temp 97.8 F (36.6 C) (Oral)   Ht 5' 6 (1.676 m)   Wt 194 lb (88 kg)   LMP 12/07/2019 (Within Months)   SpO2 98%   BMI 31.31 kg/m   The patient was awake, alert, and appropriate. The external ears were inspected, and otoscopy was  performed to evaluate the external auditory canals and tympanic membranes. The nasal cavity and septum were examined for mucosal changes, obstruction, or discharge. The oral cavity and oropharynx were inspected for mucosal lesions, infection, or tonsillar hypertrophy. The neck was palpated for lymphadenopathy, thyroid  abnormalities, or other masses. Cranial nerve function was grossly intact.  Pertinent Findings: Physical Exam HEENT: Cyst postauricular, soft, likely benign, 2.5 cm diameter. Mobile. Anterior to mastoid NECK: No abnormalities.   Seprately Identifiable Procedures:  I personally ordered, reviewed and interpreted the following with the patient today  Prior to initiating any procedures, risks/benefits/alternatives were explained to the patient and verbal consent obtained. None  Impression & Plans:  [name] Short is a 53 y.o. female  1. Hypersomnolence   2. Ear cysts   3. Obstructive sleep apnea    - Findings and diagnoses discussed in detail with the patient. - Risks, benefits, and alternatives were reviewed. Through shared decision making, the patient elects to proceed with below.  Assessment & Plan Benign cyst near left ear 2.5 cm benign cyst near left ear, possibly attached to parotid gland, complicating removal. Discussed risk of earlobe numbness and importance of CT scan. - patient elects to Monitor cyst, reassess in 2-3 months. She has had many imaging studies and surgeries given her cardiac history. She does not want surgery or imaging unless high likelihood of malignancy.  - Consider CT scan to evaluate cyst attachment, especially to parotid gland. - Discuss surgical removal if cyst changes or becomes symptomatic. -  Suspected mild sleep apnea New snoring and breathing interruptions suggest mild sleep apnea, possibly due to weight gain. Cardiac history necessitates addressing potential sleep apnea due to impact on cardiac health. - Recommend sleep study to evaluate for  sleep apnea, considering cardiac history.  - Orders placed:  Orders Placed This Encounter  Procedures   Nocturnal polysomnography (NPSG)   - Medications prescribed/continued/adjusted: No orders of the defined  types were placed in this encounter.  - Education materials provided to the patient. - Follow up: 2- 3 months. Patient instructed to return sooner or go to the ED if new/worsening symptoms develop.   Thank you for allowing me the opportunity to care for your patient. Please do not hesitate to contact me should you have any other questions.  Sincerely, Penne Croak, DO Otolaryngologist (ENT) [facility] Phone: [phone] Fax: [phone]  02/25/2024, 2:03 PM

## 2024-03-21 ENCOUNTER — Encounter: Payer: Self-pay | Admitting: Radiology

## 2024-03-27 ENCOUNTER — Other Ambulatory Visit: Payer: Self-pay | Admitting: Family Medicine

## 2024-04-20 ENCOUNTER — Encounter (HOSPITAL_BASED_OUTPATIENT_CLINIC_OR_DEPARTMENT_OTHER): Payer: Self-pay | Admitting: Cardiovascular Disease

## 2024-04-20 ENCOUNTER — Ambulatory Visit (HOSPITAL_BASED_OUTPATIENT_CLINIC_OR_DEPARTMENT_OTHER): Admitting: Cardiovascular Disease

## 2024-04-20 VITALS — BP 112/80 | HR 79 | Ht 66.0 in | Wt 195.2 lb

## 2024-04-20 DIAGNOSIS — Z8774 Personal history of (corrected) congenital malformations of heart and circulatory system: Secondary | ICD-10-CM

## 2024-04-20 DIAGNOSIS — Z952 Presence of prosthetic heart valve: Secondary | ICD-10-CM

## 2024-04-20 DIAGNOSIS — R002 Palpitations: Secondary | ICD-10-CM | POA: Diagnosis not present

## 2024-04-20 DIAGNOSIS — I7121 Aneurysm of the ascending aorta, without rupture: Secondary | ICD-10-CM | POA: Diagnosis not present

## 2024-04-20 NOTE — Patient Instructions (Signed)
 Medication Instructions:  Your physician recommends that you continue on your current medications as directed. Please refer to the Current Medication list given to you today.   *If you need a refill on your cardiac medications before your next appointment, please call your pharmacy*  Lab Work: NONE  Testing/Procedures: Your physician has requested that you have an echocardiogram. Echocardiography is a painless test that uses sound waves to create images of your heart. It provides your doctor with information about the size and shape of your heart and how well your heart's chambers and valves are working. This procedure takes approximately one hour. There are no restrictions for this procedure. Please do NOT wear cologne, perfume, aftershave, or lotions (deodorant is allowed). Please arrive 15 minutes prior to your appointment time.  Please note: We ask at that you not bring children with you during ultrasound (echo/ vascular) testing. Due to room size and safety concerns, children are not allowed in the ultrasound rooms during exams. Our front office staff cannot provide observation of children in our lobby area while testing is being conducted. An adult accompanying a patient to their appointment will only be allowed in the ultrasound room at the discretion of the ultrasound technician under special circumstances. We apologize for any inconvenience. TO BE DONE IN JULY   Follow-Up: At [facility], you and your health needs are our priority.  As part of our continuing mission to provide you with exceptional heart care, our providers are all part of one team.  This team includes your primary Cardiologist (physician) and Advanced Practice Providers or APPs (Physician Assistants and Nurse Practitioners) who all work together to provide you with the care you need, when you need it.  Your next appointment:   AFTER ECHO IN [place] WITH DR [place], CAITLIN W NP, OR ROSALINE RAMAN NP

## 2024-04-20 NOTE — Progress Notes (Signed)
 Cardiology Office Note:  .   Date:  04/20/2024  ID:  [name] Short, DOB Dec 27, 1970, MRN [id number] PCP: Wendolyn Jenkins Jansky, MD  [facility] Providers Cardiologist:  Annabella Scarce, MD    History of Present Illness: .    [name] Short is a 53 y.o. female with a bicuspid aortic valve, aortic aneysm s/p bioprosthetic AVR, obesity and mild OSA who presents for a follow up.  [name] Short had a bicuspid aortic valve and developed severe aortic regurgitation and an ascending aortic aneurysm.  In 2008 [name] Short underwent bioprosthetic aortic valve replacement and supracoronary ascending aorta replacement.  [name] Short MRA 03/2017 showed a mild ascending aneurysm 4.2 cm, unchanged from prior.  [name] Short had an echo 12/2015 that showed LVEF 60-65% and normal diastolic function.  Mean gradient across the bioprosthetic valve was 16 mmHg.     Echo 11/2023 revealed LVEF 60-65% with grade 1 diastolic dysfunction.  The mean gradient across [name] Short bioprosthetic valve increased from 13 mmHg to 22 mmHg.  MRA 12/2022 revealed ascending aorta 4.5-4.6 cm.  On CT-A 07/2023 the residual ascending aorta measured up to 4.8 cm.  [name] Short saw Dr. Lucas 09/2023 who felt it measured 4.6cm and was gradually enlarging over time.  He recommended repeat CTA in one year.    Discussed the use of AI scribe software for clinical note transcription with the patient, who gave verbal consent to proceed.  History of Present Illness [name] Short started hormone therapy approximately two and a half to three weeks ago due to symptoms of brain fog, difficulty concentrating, inability to finish reading books, weight gain, and hot flashes. [name] Short is using an estrogen patch and taking progesterone at night. Gabapentin  is taken at night for night sweats, which has significantly improved [name] Short symptoms.  [name] Short exercises regularly, engaging in activities such as walking, using the elliptical, and weight training five to six days a week. [name] Short previously participated in  kickboxing, which [name] Short enjoyed but had to reduce due to carpal tunnel syndrome symptoms that resolved after decreasing the activity.  [name] Short diet is described as healthy, with minimal junk food, fast food, and alcohol consumption. Despite these efforts, [name] Short struggles with weight loss, which [name] Short attributes to [name] Short unchanged eating habits and family history of weight issues.  [name] Short has a family history of dementia, as [name] Short father is affected by the condition. Additionally, [name] Short sister has severe sleep apnea and has been using weight loss medication with success. [name] Short herself has been evaluated for sleep apnea, with a home study suggested after insurance denied a lab study. [name] Short experiences occasional waking during sleep but does not consider it a significant issue.  ROS:  As per HPI  Studies Reviewed: SABRA   EKG Interpretation Date/Time:  Wednesday April 20 2024 09:03:14 EST Ventricular Rate:  79 PR Interval:  184 QRS Duration:  92 QT Interval:  376 QTC Calculation: 431 R Axis:   -24  Text Interpretation: Normal sinus rhythm Normal ECG Left anterior fasicular block When compared with ECG of 04-Nov-2006 08:11, ST no longer elevated in Anterolateral leads T wave inversion no longer evident in Inferior leads Confirmed by Scarce Annabella ([id number]) on 04/20/2024 9:31:53 AM    Echo 11/2023:  1. Left ventricular ejection fraction, by estimation, is 60 to 65%. The  left ventricle has normal function. The left ventricle has no regional  wall motion abnormalities. Left ventricular diastolic parameters are  consistent with Grade I diastolic  dysfunction (impaired relaxation).   2. Right ventricular systolic function  is normal. The right ventricular  size is normal.   3. The mitral valve is normal in structure. Trivial mitral valve  regurgitation. No evidence of mitral stenosis.   4. Gradient across the bioprosthetic valve has increased from in  5/22 to on this study. The leaflets are mildly to  moderately  calcified with mildly restricted motion visually. The aortic valve has  been repaired/replaced. There is mild  calcification of the aortic valve. Aortic valve regurgitation is not  visualized. Mild aortic valve stenosis. Aortic valve area, by VTI measures  1.55 cm. Aortic valve mean gradient measures 22.8 mmHg. Aortic valve Vmax  measures 3.51 m/s.   5. Aortic root/ascending aorta has been repaired/replaced. There is  borderline dilatation of the aortic root, measuring 37 mm.   6. The inferior vena cava is normal in size with greater than 50%  respiratory variability, suggesting right atrial pressure of 3 mmHg.   Risk Assessment/Calculations:             Physical Exam:   VS:  BP 112/80   Pulse 79   Ht 5' 6 (1.676 m)   Wt 195 lb 3.2 oz (88.5 kg)   LMP 12/07/2019 (Within Months)   SpO2 99%   BMI 31.51 kg/m  , BMI Body mass index is 31.51 kg/m. GENERAL:  Well appearing HEENT: Pupils equal round and reactive, fundi not visualized, oral mucosa unremarkable NECK:  No jugular venous distention, waveform within normal limits, carotid upstroke brisk and symmetric, no bruits, no thyromegaly LUNGS:  Clear to auscultation bilaterally HEART:  RRR.  PMI not displaced or sustained,S1 and S2 within normal limits, no S3, no S4, no clicks, no rubs, III/VI systolic murmur at the LUSB with radiation to the carotids ABD:  Flat, positive bowel sounds normal in frequency in pitch, no bruits, no rebound, no guarding, no midline pulsatile mass, no hepatomegaly, no splenomegaly EXT:  2 plus pulses throughout, no edema, no cyanosis no clubbing SKIN:  No rashes no nodules NEURO:  Cranial nerves II through XII grossly intact, motor grossly intact throughout PSYCH:  Cognitively intact, oriented to person place and time   ASSESSMENT AND PLAN: .    Assessment & Plan #Bicuspid aortic valve disease post bioprosthetic AVR:  # Aortic aneurysm:  Mild valve thickening, no immediate intervention.  Aortic size 4.6 cm, slow progression. Vascular surgeon managing aorta with serial imaging.  Echo shows increasing gradients.  [name] Short is asympotmatic and has no heart failure symptoms.  - Repeat echocardiogram in July.  # Diastolic dysfunction Mild, likely age-related. No heart failure symptoms. Exercise recommended. - Encouraged regular exercise.  # Obesity Difficulty losing weight despite healthy lifestyle. Family history noted. Discussed weight loss medications, particularly GLP-1 agonists, with cardiac benefits. Prefers weight management programs first.  Thyroid  function is normal.  - Referred to [facility] program. - Consider GLP-1 agonists if program insufficient.   Dispo: f/u 11/2023 after echo  Signed, Annabella Scarce, MD

## 2024-06-02 ENCOUNTER — Encounter (INDEPENDENT_AMBULATORY_CARE_PROVIDER_SITE_OTHER): Payer: Self-pay

## 2024-06-02 ENCOUNTER — Other Ambulatory Visit (INDEPENDENT_AMBULATORY_CARE_PROVIDER_SITE_OTHER): Payer: Self-pay

## 2024-06-02 ENCOUNTER — Ambulatory Visit (INDEPENDENT_AMBULATORY_CARE_PROVIDER_SITE_OTHER)

## 2024-06-02 ENCOUNTER — Telehealth (INDEPENDENT_AMBULATORY_CARE_PROVIDER_SITE_OTHER): Payer: Self-pay

## 2024-06-02 VITALS — BP 110/78 | HR 85 | Temp 97.8°F | Wt 190.0 lb

## 2024-06-02 DIAGNOSIS — Z6830 Body mass index (BMI) 30.0-30.9, adult: Secondary | ICD-10-CM

## 2024-06-02 DIAGNOSIS — G4733 Obstructive sleep apnea (adult) (pediatric): Secondary | ICD-10-CM

## 2024-06-02 DIAGNOSIS — K118 Other diseases of salivary glands: Secondary | ICD-10-CM

## 2024-06-02 DIAGNOSIS — R221 Localized swelling, mass and lump, neck: Secondary | ICD-10-CM

## 2024-06-02 DIAGNOSIS — R635 Abnormal weight gain: Secondary | ICD-10-CM | POA: Diagnosis not present

## 2024-06-02 NOTE — Telephone Encounter (Signed)
 I received a call from [place] at [facility]. She informed me that Dr. Anice had put in orders , at patient request, for patient to have FNA done there and they do not do that there, it has to go to [facility]. I sent Dr. Anice a message and I spoke with patient to inform her. Patient understood.

## 2024-06-02 NOTE — Progress Notes (Signed)
 Dear Dr. Wendolyn, Here is my assessment for our mutual patient, [name] Short. Thank you for allowing me the opportunity to care for your patient. Please do not hesitate to contact me should you have any other questions. Sincerely, Dr. Penne Croak  Otolaryngology Clinic Note Referring provider: Dr. Wendolyn HPI:  Discussed the use of AI scribe software for clinical note transcription with the patient, who gave verbal consent to proceed.  History of Present Illness [name] Short is a 54 year old female who presents with a large cyst near her ear for evaluation. She was referred by her dermatologist and regular doctor to an ENT for evaluation of the cyst.  postauricular cyst - ]cyst located near the ear, present for up to ten years - Recent increase in size prompted evaluation - Cyst is painless - History of similar cysts, including one on the neck and another on the wrist removed during childhood - Desires to avoid unnecessary procedures unless the cyst is concerning  Snoring and sleep disturbance - Recent onset of snoring that wakes her from sleep - Snoring occurs without her mouth being open - Attributes change to recent weight gain - Concerned about possible sleep apnea  Cardiac history and imaging concerns - History of heart valve replacement due to congenital defect - History of repaired aortic aneurysm - Cardiologist monitors aortic dilation and valve function closely - Cautious about undergoing additional CT scans due to frequent prior imaging and cardiac history  Right postauricular Swelling: - Persistent, non-tender swelling near the right jaw - Perceived slight increase in size, though day-to-day changes are difficult to assess - No pain or tenderness - No change in symptoms with jaw movement or mastication - Not taking aspirin  Localized Dental Discomfort: - Mild discomfort near the second to last tooth on the right side - Intensity rated as 1/10, only noticeable  when biting down on hard or crunchy foods - Dental evaluation, including two sets of x-rays, did not reveal any pathology - Discomfort described as originating in the bone - No associated jaw pain, muscle pain, or temporomandibular joint symptoms  Weight Gain and Sleep Disturbance: - Recent abnormal weight gain - Actively pursuing weight loss through daily exercise and referral to a weight management center - Previous sleep disturbance and snoring attributed to weight gain - Currently improved sleep with no nocturnal awakenings, daytime fatigue, or morning headaches - Taking nightly progesterone, estrogen patch, and gabapentin  for hot flashes - Sleeps alone with no observed apneic episodes - No history of hypertension or hyperlipidemia  Independent Review of Additional Tests or Records:  Reviewed external note from referring PCP, Kulik,describing  Chief Complaint  Patient presents with   Follow-up  . Relevant history incorporated into todays evaluation. I personally reviewed and interpreted none  PMH/Meds/All/SocHx/FamHx/ROS:   Past Medical History:  Diagnosis Date   Breast feeding status of mother    Complication of anesthesia    Congenital heart defect    bicuspid aortic valve, aortic insufficeincy, ascending aortic aneurysm => s/p AVR (bioprosthetic) and ascending aortic aneurysm repair in 2008   Family history of adverse reaction to anesthesia    Dad who also had CAD (valve disease) coded with anesthesia    Heart murmur    History of uterine fibroid    Hx of cardiac cath    [facility] (2008, pre-surgery) with no coronary artery disease, EF 60% on LV-gram.   Hx of cardiovascular stress test    ETT-Echo (7/15):  normal   Hx of  echocardiogram    Echo (7/15):  Mod LVH, EF 55-60%, no RWMA, + diast dysfunction, AVR ok (mean 16 mmHg), asc aorta 4 cm, trivial MR   Hypothyroidism    Multiple thyroid  nodules    H/o goiter with thyroid  surgery in 8/13   Palpitations 08/21/2020   PONV  (postoperative nausea and vomiting)      Past Surgical History:  Procedure Laterality Date   AORTIC VALVE REPLACEMENT     with replacement of ascending aorta   BIOPSY  11/17/2019   Procedure: BIOPSY;  Surgeon: Kristie Lamprey, MD;  Location: [facility];  Service: Endoscopy;;   CARDIAC CATHETERIZATION     CARDIAC VALVE REPLACEMENT     CESAREAN SECTION  2011   CHOLECYSTECTOMY N/A 12/20/2015   Procedure: LAPAROSCOPIC CHOLECYSTECTOMY WITH INTRAOPERATIVE CHOLANGIOGRAM;  Surgeon: Debby Shipper, MD;  Location: [facility] OR;  Service: General;  Laterality: N/A;   COLONOSCOPY WITH PROPOFOL  N/A 11/17/2019   Procedure: COLONOSCOPY WITH PROPOFOL ;  Surgeon: Kristie Lamprey, MD;  Location: [facility];  Service: Endoscopy;  Laterality: N/A;   MYOMECTOMY     open heart surgery     THYROID  LOBECTOMY  12/31/2011   Procedure: THYROID  LOBECTOMY;  Surgeon: Ida Loader, MD;  Location: [facility] OR;  Service: ENT;  Laterality: Right;  Substernal Goiter Removal   THYROID  LOBECTOMY  2003    Family History  Problem Relation Age of Onset   Cancer Mother 24       Gall bladder; found at time gallstone found; smoker   Hypertension Mother    Gallstones Mother    Other Mother        hx of hysterectomy for unspecified reason   Prostate cancer Father        dx early 29s; not very aggressive; s/p seed implants   Skin cancer Father        unspecified type; +sun exposure   Heart Problems Father        same as pt's; s/p surgery   Goiter Sister    Hypothyroidism Sister    Thyroid  disease Sister    Cancer Maternal Grandmother        Gallbladder dx. 58-59; smoker   Heart disease Maternal Grandfather    Heart Problems Maternal Grandfather        heart disease   Heart Problems Paternal Uncle        heart disease, d. 69s; +EtOH abuse   Other Sister        hx of 1 benign breast biopys   Ovarian cancer Other        maternal great aunt (MGM's sister); lim info     Social Connections: Unknown (11/01/2022)   Social Connection and  Isolation Panel    Frequency of Communication with Friends and Family: Patient declined    Frequency of Social Gatherings with Friends and Family: Once a week    Attends Religious Services: Never    Database Administrator or Organizations: No    Attends Engineer, Structural: Not on file    Marital Status: Married      Current Outpatient Medications:    aspirin EC 81 MG tablet, Take 1 tablet (81 mg total) by mouth daily., Disp: , Rfl:    calcium carbonate (OSCAL) 1500 (600 Ca) MG TABS tablet, Take by mouth daily with breakfast., Disp: , Rfl:    clindamycin  (CLEOCIN ) 300 MG capsule, TAKE 2 CAPSULES BY MOUTH PRIOR TO ALL DENTAL APPOINTMENTS, Disp: , Rfl:    estradiol (VIVELLE-DOT) 0.05  MG/24HR patch, Place 1 patch onto the skin 2 (two) times a week., Disp: , Rfl:    gabapentin  (NEURONTIN ) 300 MG capsule, TAKE 1 CAPSULE(300 MG) BY MOUTH THREE TIMES DAILY, Disp: 90 capsule, Rfl: 1   meloxicam  (MOBIC ) 15 MG tablet, Take 1 tablet (15 mg total) by mouth daily as needed., Disp: 90 tablet, Rfl: 1   progesterone (PROMETRIUM) 100 MG capsule, Take 100 mg by mouth daily., Disp: , Rfl:    VITAMIN D, CHOLECALCIFEROL, PO, Take by mouth., Disp: , Rfl:    Physical Exam:   BP 110/78 (BP Location: Right Arm, Patient Position: Sitting, Cuff Size: Normal)   Pulse 85   Temp 97.8 F (36.6 C)   Wt 190 lb (86.2 kg)   LMP 12/07/2019   SpO2 98%   BMI 30.67 kg/m   The patient was awake, alert, and appropriate. The external ears were inspected, and otoscopy was performed to evaluate the external auditory canals and tympanic membranes. The nasal cavity and septum were examined for mucosal changes, obstruction, or discharge. The oral cavity and oropharynx were inspected for mucosal lesions, infection, or tonsillar hypertrophy. The neck was palpated for lymphadenopathy, thyroid  abnormalities, or other masses. Cranial nerve function was grossly intact.  Pertinent Findings: Physical Exam HEENT: Cyst  postauricular, soft, 2.5 cm diameter. Decreased mobiilty. Anterior to mastoid NECK: No abnormalities.: Atraumatic, normocephalic. Oral cavity normal. Mild discomfort at the second to last tooth. No surrounding lesion or scalp lesions. No other neck lymphadenopathy. No skin changes. No paresthesia or FN weakenss  Impression & Plans:  [name] Short is a 54 y.o. female  1. Mass of right parotid gland   2. Obstructive sleep apnea   3. Weight gain    - Findings and diagnoses discussed in detail with the patient. - Risks, benefits, and alternatives were reviewed. Through shared decision making, the patient elects to proceed with below.  Assessment & Plan Right neck mass, likely tail of parotid gland  Suspected benign neoplasm; though tissue diagnosis required given increase in size and decreased mobility. Proximity to facial nerve precludes open excisional biopsy. No clinical evidence of facial nerve involvement. -  Patient can have FNA at my office vs ultrasound-guided needle biopsy at non-hospital outpatient center. - Discussed pathology results communication and management based on findings. - Informed of potential need for core biopsy if needle biopsy is non-diagnostic.  Obstructive sleep apnea Previously reported symptoms improved with progesterone and gabapentin . Obesity is a contributing factor. Further polysomnography may be indicated if symptoms recur. - No witnessed apnea or daytime somnolence. Recommend lifestyle changes. - Discussed over-the-counter oral appliances for mild cases. - Discussed potential need for repeat polysomnography if symptoms recur or worsen.  - Orders placed:  Orders Placed This Encounter  Procedures   US  Soft Tissue Head/Neck (NON-THYROID )   US  FNA SALIVARY GLAND/PAROTID GLAND   Ambulatory Referral For Surgery Scheduling   - Medications prescribed/continued/adjusted: No orders of the defined types were placed in this encounter.  - Education materials  provided to the patient. - Follow up: after biopsy. Patient instructed to return sooner or go to the ED if new/worsening symptoms develop.  Thank you for allowing me the opportunity to care for your patient. Please do not hesitate to contact me should you have any other questions.  Sincerely, Penne Croak, DO Otolaryngologist (ENT) [facility] Phone: [phone] Fax: [phone]  06/05/2024, 5:31 AM    I spent 31 minutes (exclusive of separately billable procedures) on the day of the encounter  reviewing the patient's chart, seeing the patient face to face, discussing the findings and the treatment plan, and documenting in the EHR.

## 2024-06-03 ENCOUNTER — Encounter (HOSPITAL_COMMUNITY): Payer: Self-pay

## 2024-06-03 NOTE — Progress Notes (Signed)
 Anice Riis, DO  Mugweru, Thom, MD; Zania Kalisz Cc: Carlie Clarita RAMAN Sounds good. Thank you!       Previous Messages    ----- Message ----- From: Hughes Thom, MD Sent: 06/03/2024   1:12 PM EST To: Clarita RAMAN Carlie; Eleanor Mighty; Riis Gallery* Subject: RE: US  FNA SALIVARY GLAND/PAROTID GLAND        @Dr  Anice. Yes, US  and FNA can be done same day. Since we are an image-guided service, we require imaging prior to procedure  @Janet . Pls schedule Pt for US  head and neck, followed by US  R parotid mass FNA, Separate accessions. Local only.   Thom Hughes, MD [facility] Rad[facility]  Pager. [phone] Clinic. [phone] ----- Message ----- From: Anice Riis, DO Sent: 06/03/2024  12:24 PM EST To: Thom Hughes, MD; Eleanor Mighty Subject: RE: US  FNA SALIVARY GLAND/PAROTID GLAND        This should be a very easy FNA. It really doesn't need to be US  guided but I don't have the supplies yet in my office. Can you do the FNA and US  the same day? ----- Message ----- From: Hughes Thom, MD Sent: 06/03/2024  11:26 AM EST To: Yari Szeliga; Riis Anice, DO Subject: RE: US  FNA SALIVARY GLAND/PAROTID GLAND        PROCEDURE / BIOPSY REVIEW Date: 06/03/24  Requested Biopsy site: R parotid Reason for request: mass Imaging review: None relevant.  Decision: Declined / Defer  No relevant imaging presented / available for review for reported parotid mass Recommend US  Head and Neck if clinical concern for salivary gland mass, prior to re-submission.  Please contact me with questions, concerns, or if issue pertaining to this request arise.  Thom Hughes, MD [facility] ----- Message ----- From: Tayler Lassen Sent: 06/03/2024   9:19 AM EST To: Ahyan Kreeger; Ir Procedure Requests Subject: US  FNA SALIVARY GLAND/PAROTID GLAND            Procedure : US  FNA  SALIVARY GLAND/PAROTID GLAND  Reason: parotid mass, right Dx: Mass of right parotid gland [K11.8 (ICD-10-CM)]     History : CT angio chest aorta w.wo, MR angio chest w/wo  Provider : Anice Riis, DO  Contact : [phone]

## 2024-06-06 ENCOUNTER — Encounter (HOSPITAL_COMMUNITY): Payer: Self-pay

## 2024-06-06 NOTE — Progress Notes (Signed)
 Hughes Simmonds, MD  Jamell Opfer Cc: Carlie Hoose S Any available site is OK.  ONA, MD       Previous Messages    ----- Message ----- From: Orlandis Sanden Sent: 06/03/2024   1:48 PM EST To: Simmonds Hughes, MD; Hoose GORMAN Carlie Subject: RE: US  FNA SALIVARY GLAND/PAROTID GLAND        Can this be done [facility] or [facility] - patient lives in [place] ----- Message ----- From: Anice Riis, DO Sent: 06/03/2024   1:16 PM EST To: Simmonds Hughes, MD; Hoose GORMAN Carlie; Eleanor Sicks* Subject: RE: US  FNA SALIVARY GLAND/PAROTID GLAND        Sounds good. Thank you! ----- Message ----- From: Hughes Simmonds, MD Sent: 06/03/2024   1:12 PM EST To: Hoose GORMAN Carlie; Eleanor Mighty; Riis Gallery* Subject: RE: US  FNA SALIVARY GLAND/PAROTID GLAND        @Dr  Anice. Yes, US  and FNA can be done same day. Since we are an image-guided service, we require imaging prior to procedure  @Janet . Pls schedule Pt for US  head and neck, followed by US  R parotid mass FNA, Separate accessions. Local only.   Simmonds Hughes, MD [facility] Radiology  Pager. [phone] Clinic. [phone] ----- Message ----- From: Anice Riis, DO Sent: 06/03/2024  12:24 PM EST To: Simmonds Hughes, MD; Eleanor Mighty Subject: RE: US  FNA SALIVARY GLAND/PAROTID GLAND        This should be a very easy FNA. It really doesn't need to be US  guided but I don't have the supplies yet in my office. Can you do the FNA and US  the same day? ----- Message ----- From: Hughes Simmonds, MD Sent: 06/03/2024  11:26 AM EST To: Fawna Cranmer; Riis Anice, DO Subject: RE: US  FNA SALIVARY GLAND/PAROTID GLAND        PROCEDURE / BIOPSY REVIEW Date: 06/03/24  Requested Biopsy site: R parotid Reason for request: mass Imaging review: None relevant.  Decision: Declined / Defer  No relevant imaging presented / available for review for reported parotid mass Recommend US  Head and Neck if clinical concern for  salivary gland mass, prior to re-submission.  Please contact me with questions, concerns, or if issue pertaining to this request arise.  Simmonds Hughes, MD [facility] Radiology ----- Message ----- From: Linsi Humann Sent: 06/03/2024   9:19 AM EST To: Navid Lenzen; Ir Procedure Requests Subject: US  FNA SALIVARY GLAND/PAROTID GLAND            Procedure : US  FNA SALIVARY GLAND/PAROTID GLAND  Reason: parotid mass, right Dx: Mass of right parotid gland [K11.8 (ICD-10-CM)]     History : CT angio chest aorta w.wo, MR angio chest w/wo  Provider : Anice Riis, DO  Contact : [phone]

## 2024-06-22 ENCOUNTER — Encounter (INDEPENDENT_AMBULATORY_CARE_PROVIDER_SITE_OTHER): Payer: Self-pay

## 2024-06-22 ENCOUNTER — Telehealth (INDEPENDENT_AMBULATORY_CARE_PROVIDER_SITE_OTHER): Payer: Self-pay

## 2024-06-22 NOTE — Telephone Encounter (Signed)
 So the FNA we do in office we do not use an ultra sound to guide it. The one in the hospital is the only way to get the ultra sound guide.

## 2024-06-22 NOTE — Telephone Encounter (Signed)
 Called patient to schedule appointment for a 30 minute In-Office Procedure for FNA per Sharlet M's,Dr. Knight's MA) request and spoke with patient.  Patient stated she wanted to know if the Procedure In-Office was less expensive than having it done in the hospital and if the Procedure was the same as the hospital procedure - will a biopsy and ultrasound be done in office?  Patient did not want to schedule the in-office procedure at this time.  Patient would like a call back with the CPT Codes for the In-Office Procedure and Hospital Procedure so that she can call her insurance company and find out the cost of each.  She can be reached at [phone].

## 2024-06-22 NOTE — Telephone Encounter (Signed)
 I can do an in office FNA biopsy. She needs a 30 min procedure appt.

## 2024-06-22 NOTE — Telephone Encounter (Signed)
 The patient called in and wanted to know if we can do the biopsy in our office instead of her going to the hospital to do this precedure?  She has it scheduled but then was told it would be $500 for her to have it done there.  She thinks someone said she may be able to get this done in our office instead.  Please advise.

## 2024-06-24 ENCOUNTER — Ambulatory Visit (HOSPITAL_COMMUNITY)

## 2024-08-01 ENCOUNTER — Ambulatory Visit (INDEPENDENT_AMBULATORY_CARE_PROVIDER_SITE_OTHER)

## 2024-11-21 ENCOUNTER — Other Ambulatory Visit (HOSPITAL_BASED_OUTPATIENT_CLINIC_OR_DEPARTMENT_OTHER)

## 2025-01-05 ENCOUNTER — Encounter: Admitting: Family Medicine
